# Patient Record
Sex: Male | Born: 1945
Health system: Southern US, Community
[De-identification: ages and names within clinical notes are randomized; demographics above are authoritative.]

## PROBLEM LIST (undated history)

## (undated) DIAGNOSIS — M1380 Other specified arthritis, unspecified site: Secondary | ICD-10-CM

## (undated) DIAGNOSIS — F418 Other specified anxiety disorders: Secondary | ICD-10-CM

## (undated) DIAGNOSIS — G4733 Obstructive sleep apnea (adult) (pediatric): Secondary | ICD-10-CM

## (undated) DIAGNOSIS — Z8619 Personal history of other infectious and parasitic diseases: Secondary | ICD-10-CM

## (undated) DIAGNOSIS — K219 Gastro-esophageal reflux disease without esophagitis: Secondary | ICD-10-CM

## (undated) DIAGNOSIS — N400 Enlarged prostate without lower urinary tract symptoms: Secondary | ICD-10-CM

## (undated) DIAGNOSIS — G8929 Other chronic pain: Secondary | ICD-10-CM

## (undated) DIAGNOSIS — N402 Nodular prostate without lower urinary tract symptoms: Secondary | ICD-10-CM

## (undated) DIAGNOSIS — M75100 Unspecified rotator cuff tear or rupture of unspecified shoulder, not specified as traumatic: Secondary | ICD-10-CM

## (undated) DIAGNOSIS — G5600 Carpal tunnel syndrome, unspecified upper limb: Secondary | ICD-10-CM

## (undated) DIAGNOSIS — M5090 Cervical disc disorder, unspecified, unspecified cervical region: Secondary | ICD-10-CM

## (undated) DIAGNOSIS — R319 Hematuria, unspecified: Secondary | ICD-10-CM

## (undated) DIAGNOSIS — M545 Low back pain, unspecified: Secondary | ICD-10-CM

## (undated) DIAGNOSIS — G4700 Insomnia, unspecified: Secondary | ICD-10-CM

## (undated) DIAGNOSIS — Z9989 Dependence on other enabling machines and devices: Secondary | ICD-10-CM

## (undated) DIAGNOSIS — Z86010 Personal history of colonic polyps: Secondary | ICD-10-CM

## (undated) HISTORY — DX: Obstructive sleep apnea (adult) (pediatric): G47.33

## (undated) HISTORY — DX: Carpal tunnel syndrome, unspecified upper limb: G56.00

## (undated) HISTORY — DX: Nodular prostate without lower urinary tract symptoms: N40.2

## (undated) HISTORY — DX: Low back pain: M54.5

## (undated) HISTORY — DX: Low back pain, unspecified: M54.50

## (undated) HISTORY — DX: Cervical disc disorder, unspecified, unspecified cervical region: M50.90

## (undated) HISTORY — DX: Other specified anxiety disorders: F41.8

## (undated) HISTORY — DX: Hematuria, unspecified: R31.9

## (undated) HISTORY — DX: Unspecified rotator cuff tear or rupture of unspecified shoulder, not specified as traumatic: M75.100

## (undated) HISTORY — DX: Insomnia, unspecified: G47.00

## (undated) HISTORY — DX: Other chronic pain: G89.29

## (undated) HISTORY — DX: Personal history of other infectious and parasitic diseases: Z86.19

## (undated) HISTORY — DX: Obstructive sleep apnea (adult) (pediatric): Z99.89

## (undated) HISTORY — DX: Personal history of colonic polyps: Z86.010

## (undated) HISTORY — DX: Benign prostatic hyperplasia without lower urinary tract symptoms: N40.0

## (undated) HISTORY — DX: Gastro-esophageal reflux disease without esophagitis: K21.9

## (undated) HISTORY — DX: Other specified arthritis, unspecified site: M13.80

---

## 2000-02-08 HISTORY — PX: ACHILLES TENDON REPAIR: SUR1153

## 2003-02-08 DIAGNOSIS — Z860100 Personal history of colon polyps, unspecified: Secondary | ICD-10-CM

## 2003-02-08 HISTORY — DX: Personal history of colonic polyps: Z86.010

## 2003-02-08 HISTORY — DX: Personal history of colon polyps, unspecified: Z86.0100

## 2005-02-07 HISTORY — PX: CATARACT EXTRACTION: SUR2

## 2005-02-07 HISTORY — PX: UPPER GASTROINTESTINAL ENDOSCOPY: SHX188

## 2006-02-07 HISTORY — PX: ROTATOR CUFF REPAIR: SHX139

## 2006-08-08 HISTORY — PX: DOBUTAMINE STRESS ECHO: SHX5426

## 2008-02-22 LAB — LIPID PANEL
Cholesterol: 149 mg/dL (ref 0–200)
HDL: 50 mg/dL (ref 35–70)
Triglycerides: 75

## 2008-11-07 HISTORY — PX: COLONOSCOPY: SHX174

## 2009-01-20 LAB — CBC
Hemoglobin: 14 g/dL (ref 13.5–17.5)
WBC: 6.5

## 2009-11-25 LAB — COMPREHENSIVE METABOLIC PANEL
AST: 26 U/L
Albumin: 3.9
Glucose: 97
Potassium: 4.1 mmol/L

## 2010-06-08 HISTORY — PX: LUMBAR DISC SURGERY: SHX700

## 2010-09-08 HISTORY — PX: OTHER SURGICAL HISTORY: SHX169

## 2010-11-30 ENCOUNTER — Ambulatory Visit: Payer: Self-pay | Admitting: Family Medicine

## 2010-12-02 ENCOUNTER — Ambulatory Visit (INDEPENDENT_AMBULATORY_CARE_PROVIDER_SITE_OTHER): Payer: BC Managed Care – PPO | Admitting: Family Medicine

## 2010-12-02 ENCOUNTER — Encounter: Payer: Self-pay | Admitting: Family Medicine

## 2010-12-02 DIAGNOSIS — K635 Polyp of colon: Secondary | ICD-10-CM | POA: Insufficient documentation

## 2010-12-02 DIAGNOSIS — K219 Gastro-esophageal reflux disease without esophagitis: Secondary | ICD-10-CM

## 2010-12-02 DIAGNOSIS — F341 Dysthymic disorder: Secondary | ICD-10-CM

## 2010-12-02 DIAGNOSIS — M129 Arthropathy, unspecified: Secondary | ICD-10-CM

## 2010-12-02 DIAGNOSIS — R319 Hematuria, unspecified: Secondary | ICD-10-CM | POA: Insufficient documentation

## 2010-12-02 DIAGNOSIS — M75100 Unspecified rotator cuff tear or rupture of unspecified shoulder, not specified as traumatic: Secondary | ICD-10-CM | POA: Insufficient documentation

## 2010-12-02 DIAGNOSIS — F331 Major depressive disorder, recurrent, moderate: Secondary | ICD-10-CM | POA: Insufficient documentation

## 2010-12-02 DIAGNOSIS — G4710 Hypersomnia, unspecified: Secondary | ICD-10-CM

## 2010-12-02 DIAGNOSIS — M1990 Unspecified osteoarthritis, unspecified site: Secondary | ICD-10-CM

## 2010-12-02 DIAGNOSIS — S43429A Sprain of unspecified rotator cuff capsule, initial encounter: Secondary | ICD-10-CM

## 2010-12-02 DIAGNOSIS — F418 Other specified anxiety disorders: Secondary | ICD-10-CM

## 2010-12-02 DIAGNOSIS — G4733 Obstructive sleep apnea (adult) (pediatric): Secondary | ICD-10-CM

## 2010-12-02 DIAGNOSIS — D126 Benign neoplasm of colon, unspecified: Secondary | ICD-10-CM

## 2010-12-02 MED ORDER — OMEPRAZOLE 20 MG PO CPDR: 20.0000 mg | DELAYED_RELEASE_CAPSULE | Freq: Every day | ORAL | Status: DC

## 2010-12-02 MED ORDER — NYSTATIN 100000 UNIT/ML MT SUSP: 500000.0000 [IU] | Freq: Four times a day (QID) | OROMUCOSAL | Status: DC | PRN

## 2010-12-02 MED ORDER — DEXTROAMPHETAMINE SULFATE 10 MG PO TABS: 10.0000 mg | ORAL_TABLET | ORAL | Status: DC | PRN

## 2010-12-02 MED ORDER — BUPROPION HCL ER (XL) 300 MG PO TB24: 300.0000 mg | ORAL_TABLET | Freq: Every day | ORAL | Status: DC

## 2010-12-02 MED ORDER — MELOXICAM 15 MG PO TABS: 15.0000 mg | ORAL_TABLET | ORAL | Status: DC

## 2010-12-02 MED ORDER — TERAZOSIN HCL 1 MG PO CAPS: 1.0000 mg | ORAL_CAPSULE | Freq: Every day | ORAL | Status: DC

## 2010-12-02 MED ORDER — ZOLPIDEM TARTRATE 10 MG PO TABS: 10.0000 mg | ORAL_TABLET | Freq: Every evening | ORAL | Status: DC | PRN

## 2010-12-02 MED ORDER — METHOCARBAMOL 750 MG PO TABS: 750.0000 mg | ORAL_TABLET | Freq: Three times a day (TID) | ORAL | Status: DC

## 2010-12-02 NOTE — Assessment & Plan Note (Signed)
Chronic, stable. Continue omeprazole  

## 2010-12-02 NOTE — Assessment & Plan Note (Signed)
?   of this diagnosis.   Refilled stimulant x 1 month, but advised pt will need to set up with neuro or pulm for further management.  Awaiting records.

## 2010-12-02 NOTE — Assessment & Plan Note (Signed)
On wellbutrin, has seen psych in past.

## 2010-12-02 NOTE — Progress Notes (Signed)
Subjective:    Patient ID: Gabriel [name], male    DOB: 08-11-1945, 65 y.o.   MRN: [id number]  HPI CC: new pt, establish  First visit here.  This year had L3 diskectomy in [place].  Back pain and right shoulder recently flared.  MRI of lower back recently 09/2010, no records available.  States has seen pain doctor, has had ESI in past, didn't help.  Only currently on roxicet qhs prn.  Previously on gabapentin and skelaxin.  Has had bilateral supraspinatus tendon tear, s/p repair on left side.  Last MRI of shoulder was ~2008.  Feeling extremely tired.  H/o OSA - using nasal mask.  Reports compliance and tolerating mask.  clergyman and substitute Runner, broadcasting/film/video.  Falls asleep in classroom occasionally.  Started on stimulant by neurology for what sounds like hypersomnolence. Trouble sleeping at night.  When awakens, unable to fall back asleep.  Bedtime is 11:30pm.  wakes up between 4 and 6am.  20lb weight loss in last year, some trying, some thinks due to depression.  Decreased appetite.  No fevers/chills, night sweats.  Unexplained blood in urine for last 2 years, had urology w/u, unrevealing per pt.  Sees urology every 6 mo for f/u.  Wife had stroke 25 yrs ago, currently in power chair, has to help her transfer, lots of lifting, significant impact on own MSK issues.  Preventative:  Colonoscopy 2009, will await records Prostate - followed by urology.  Possible prostate nodule, PSA always ok per pt tetanus 2010 Pneumonia 2008 Flu 2012.  Medications and allergies reviewed and updated in chart.  Past histories reviewed and updated if relevant as below. There is no problem list on file for this patient.  Past Medical History  Diagnosis Date  . Arthritis   . Hematuria - cause not known     s/p urology workup 2010  . History of chicken pox   . Depression   . GERD (gastroesophageal reflux disease)   . Colon polyp   . OSA (obstructive sleep apnea)     nasal cpap machine, ambien  . Torn  rotator cuff     bilateral, s/p L repair   Past Surgical History  Procedure Date  . Achilles tendon repair 2002  . Cataract extraction 2007    left  . Rotator cuff repair 2008    Left  . Lumbar microdiscectomy 2012    L3  . Colonoscopy 2009    repeat due    History  Substance Use Topics  . Smoking status: Never Smoker   . Smokeless tobacco: Never Used  . Alcohol Use: No   Family History  Problem Relation Age of Onset  . Arthritis Mother   . COPD Father     smoker  . Cancer Neg Hx   . Stroke Neg Hx   . Coronary artery disease Neg Hx   . Diabetes Neg Hx    Allergies  Allergen Reactions  . Amoxicillin Nausea And Vomiting  . Erythromycin Nausea And Vomiting  . Sulfa Drugs Cross Reactors Other (See Comments)    Thrush   No current outpatient prescriptions on file prior to visit.   Review of Systems  Constitutional: Negative for fever, chills, activity change, appetite change, fatigue and unexpected weight change.  HENT: Negative for hearing loss and neck pain.   Eyes: Negative for visual disturbance.  Respiratory: Positive for cough (mild). Negative for chest tightness, shortness of breath and wheezing.   Cardiovascular: Negative for chest pain, palpitations and leg swelling.  Gastrointestinal: Negative for nausea, vomiting, abdominal pain, diarrhea, constipation, blood in stool and abdominal distention.  Genitourinary: Positive for hematuria. Negative for difficulty urinating.  Musculoskeletal: Negative for myalgias and arthralgias.  Skin: Negative for rash.  Neurological: Negative for dizziness, seizures, syncope and headaches.  Hematological: Does not bruise/bleed easily.  Psychiatric/Behavioral: Positive for dysphoric mood. The patient is nervous/anxious.       Objective:   Physical Exam  Nursing note and vitals reviewed. Constitutional: He is oriented to person, place, and time. He appears well-developed and well-nourished. No distress.  HENT:  Head:  Normocephalic and atraumatic.  Right Ear: External ear normal.  Left Ear: External ear normal.  Nose: Nose normal.  Mouth/Throat: Oropharynx is clear and moist.  Eyes: Conjunctivae and EOM are normal. Pupils are equal, round, and reactive to light.  Neck: Normal range of motion. Neck supple. No thyromegaly present.  Cardiovascular: Normal rate, regular rhythm, normal heart sounds and intact distal pulses.   No murmur heard. Pulses:      Radial pulses are 2+ on the right side, and 2+ on the left side.  Pulmonary/Chest: Effort normal and breath sounds normal. No respiratory distress. He has no wheezes. He has no rales.  Abdominal: Soft. Bowel sounds are normal. He exhibits no distension and no mass. There is no tenderness. There is no rebound and no guarding.  Musculoskeletal: Normal range of motion.       No shoulder pain or deformity + tender to empty can test on left, normal on right  Lymphadenopathy:    He has no cervical adenopathy.  Neurological: He is alert and oriented to person, place, and time.       CN grossly intact, station and gait intact  Skin: Skin is warm and dry. No rash noted.  Psychiatric: He has a normal mood and affect. His behavior is normal. Judgment and thought content normal.       Mildly restless, talkative      Assessment & Plan:  Prior physicians: PCP Dr. Nicholes Calamity, [phone] Neuro Dr. Laurena Spies, [phone] Rheum Dr. Alyse Low, [phone]

## 2010-12-02 NOTE — Assessment & Plan Note (Signed)
Last colonoscopy 2009. Await records to see when next is due.

## 2010-12-02 NOTE — Assessment & Plan Note (Signed)
R recently flared.  Anticipate tendonitis.  Treat with mobic scheduled for 5 days then PRN pain. rec ice/heat. Return if not improved for further eval.

## 2010-12-02 NOTE — Patient Instructions (Signed)
Good to meet you today. Take mobic once daily for 5 days then as needed for shoulder pain.  May ice/heat shoulder. Try to avoid heavy lifting >20lbs, or straining shoulder. I have refilled all meds.  I have refilled dextroamphetamine x 1 month but will want to set you up with lung or neurology for further refills. I will await records, work on referrals after I review records.

## 2010-12-02 NOTE — Assessment & Plan Note (Signed)
Awaiting records from prior PCP.

## 2010-12-02 NOTE — Assessment & Plan Note (Signed)
S/p diskectomy. Await records.   Hopeful for mobic to help with lower back inflammation.   Update [place] as needed.

## 2010-12-17 ENCOUNTER — Encounter: Payer: Self-pay | Admitting: Family Medicine

## 2011-01-06 ENCOUNTER — Ambulatory Visit (INDEPENDENT_AMBULATORY_CARE_PROVIDER_SITE_OTHER): Payer: BC Managed Care – PPO | Admitting: Family Medicine

## 2011-01-06 ENCOUNTER — Encounter: Payer: Self-pay | Admitting: Family Medicine

## 2011-01-06 DIAGNOSIS — M25519 Pain in unspecified shoulder: Secondary | ICD-10-CM

## 2011-01-06 DIAGNOSIS — G8929 Other chronic pain: Secondary | ICD-10-CM

## 2011-01-06 DIAGNOSIS — M545 Low back pain, unspecified: Secondary | ICD-10-CM

## 2011-01-06 DIAGNOSIS — M25511 Pain in right shoulder: Secondary | ICD-10-CM

## 2011-01-06 MED ORDER — AMITRIPTYLINE HCL 25 MG PO TABS: 25.0000 mg | ORAL_TABLET | Freq: Every day | ORAL | Status: DC

## 2011-01-06 NOTE — Assessment & Plan Note (Signed)
States has tried lyrica, gabapentin, cymbalta. Trial of elavil for next 3-4 wks, update me if no change.

## 2011-01-06 NOTE — Patient Instructions (Signed)
For shoulder - I wonder if this is biceps tendonitis.  Treat with stretching exercises provided today.  mobic (meloxicam) for pain as well as continue percocets and robaxin. We will start you on amitriptyline 25mg  at bedtime - watch for dry mouth on this medicine. If symptoms not improving let me know for next step. Good to see you today.  Call us with questions.

## 2011-01-06 NOTE — Assessment & Plan Note (Addendum)
H/o rotator cuff tear, however exam not consistent with this today. ?biceps tendinitis - treat as such with stretch exercises from [facility] pt advisor and rec continue current pain meds.   Update me if sxs not improved, consider referral to PT or ortho. See above. Pain/exam not consistent with cervical etiology. A total of 25 minutes were spent face-to-face with the patient during this encounter and over half of that time was spent on counseling and coordination of care

## 2011-01-06 NOTE — Progress Notes (Signed)
Subjective:    Patient ID: Gabriel [name], male    DOB: 1945-06-16, 65 y.o.   MRN: [id number]  HPI CC: continued neck/arm pain  Left handed gentleman presents for f/u.  Chronic back pain issues, shoulder pain.  Lower lumbar pain worsened with prolonged sitting, improved with walking.    Thoracic pain R>L, accentuated with recent move (lifting boxes, wife in wheelchair),   H/o L rotator cuff tear s/p repair 02/2006.  Worsened pain on right shoulder/upper arm feels like shooting/stabbing pain as well as lateral shoulder.  No numbness/tingling.  Endorsing occasional weakness in right arm.  Going to gym 2x/wk, water exercises.  On meloxicam 15mg  daily, methocarbamol 750mg  bid, percocet 5/500 mg (takes 2-3/wk).  Pain in back stays (3/10), at its worst 8/10.  Arm pain worse with exhertion (10/10 pain when shooting), otherwise 3-4/10.  Has been on lyrica, cymbalta in past.  Previously on gabapentin and skelaxin as well.   This year had L3 diskectomy in [place]. Back pain and right shoulder recently flared. MRI of lower back recently 09/2010. States has seen pain doctor, has had ESI in past, didn't help. Has had bilateral supraspinatus tendon tear, s/p repair on left side. Last MRI of shoulder was ~2008.  Medications and allergies reviewed and updated in chart.  Past histories reviewed and updated if relevant as below. Patient Active Problem List  Diagnoses  . Arthritis  . Hematuria - cause not known  . Depression with anxiety  . GERD (gastroesophageal reflux disease)  . OSA (obstructive sleep apnea)  . Torn rotator cuff  . Colon polyp  . Hypersomnolence   Past Medical History  Diagnosis Date  . Rheumatoid arthritis   . Hematuria - cause not known     s/p urology workup 2010  . History of chicken pox   . Depression with anxiety   . GERD (gastroesophageal reflux disease)   . Colon polyp   . OSA (obstructive sleep apnea)     nasal cpap machine at 12cm, ambien  . Torn rotator  cuff     bilateral, s/p L repair  . Chronic lower back pain     lumbar stenosis with persistent numbness R ant thigh   Past Surgical History  Procedure Date  . Achilles tendon repair 2002  . Cataract extraction 2007    left  . Rotator cuff repair 2008    Left (Dr. Holley Dexter in [place], now [place])  . Lumbar disc surgery 2012    L3 laminectomy (Dr. Wyline Mood at [facility])  . Colonoscopy 2009    repeat due ??  . Mri thoracic 09/2010    thoracic DDD/kyphosis, minimal anterolisthesis T2/3 with B foraminal narrowing   History  Substance Use Topics  . Smoking status: Never Smoker   . Smokeless tobacco: Never Used  . Alcohol Use: No   Family History  Problem Relation Age of Onset  . Arthritis Mother   . COPD Father     smoker  . Cancer Neg Hx   . Stroke Neg Hx   . Coronary artery disease Neg Hx   . Diabetes Neg Hx    Allergies  Allergen Reactions  . Amoxicillin Nausea And Vomiting  . Erythromycin Nausea And Vomiting  . Sulfa Drugs Cross Reactors Other (See Comments)    Thrush   Current Outpatient Prescriptions on File Prior to Visit  Medication Sig Dispense Refill  . buPROPion (WELLBUTRIN XL) 300 MG 24 hr tablet Take 1 tablet (300 mg total) by mouth daily.  90  tablet  3  . dextroamphetamine (DEXTROSTAT) 10 MG tablet Take 1 tablet (10 mg total) by mouth as needed. Per neurology  30 tablet  0  . meloxicam (MOBIC) 15 MG tablet Take 1 tablet (15 mg total) by mouth as directed. Take 1 pill daily for 5 days and then daily as needed.  30 tablet  1  . methocarbamol (ROBAXIN) 750 MG tablet Take 1 tablet (750 mg total) by mouth 3 (three) times daily.  270 tablet  3  . omeprazole (PRILOSEC) 20 MG capsule Take 1 capsule (20 mg total) by mouth daily.  90 capsule  3  . oxycodone-acetaminophen (ROXICET) 5-500 MG per tablet Take 1 tablet by mouth at bedtime as needed.        . terazosin (HYTRIN) 1 MG capsule Take 1 capsule (1 mg total) by mouth at bedtime.  90 capsule  3  . zolpidem (AMBIEN) 10 MG  tablet Take 1 tablet (10 mg total) by mouth at bedtime as needed.  90 tablet  3  . nystatin (MYCOSTATIN) 100000 UNIT/ML suspension Take 5 mLs (500,000 Units total) by mouth 4 (four) times daily as needed.  180 mL  1     Review of Systems Per HPI    Objective:   Physical Exam  Nursing note and vitals reviewed. Constitutional: He appears well-developed and well-nourished. No distress.  Musculoskeletal:       Neck - FROM.  Neg spurling sign Thoracic back - no pain midline, paraspinous mm tenderness. Lumbar back - no midline spine tenderness. Shoulders - FROM without pain.  Tender to palpation right anterior shoulder joint at biceps tendon.  No pain with int/ext rotation of shoulder, no pain with neer's or empty can sign, + speed's test on right.  Neurological: He is alert.  Skin: Skin is warm and dry. No rash noted.  Psychiatric: He has a normal mood and affect.       Assessment & Plan:

## 2011-01-09 ENCOUNTER — Encounter: Payer: Self-pay | Admitting: Family Medicine

## 2011-01-19 ENCOUNTER — Encounter: Payer: Self-pay | Admitting: Family Medicine

## 2011-02-11 ENCOUNTER — Telehealth: Payer: Self-pay | Admitting: Family Medicine

## 2011-02-11 NOTE — Telephone Encounter (Signed)
Call to confirm refill requests and questions re: medications.  Transferred voice mail to [facility] phone.

## 2011-02-14 ENCOUNTER — Other Ambulatory Visit: Payer: Self-pay | Admitting: Family Medicine

## 2011-02-14 NOTE — Telephone Encounter (Signed)
OK to refill

## 2011-02-14 NOTE — Telephone Encounter (Signed)
Sent in

## 2011-02-19 ENCOUNTER — Ambulatory Visit (INDEPENDENT_AMBULATORY_CARE_PROVIDER_SITE_OTHER): Payer: BC Managed Care – PPO | Admitting: Internal Medicine

## 2011-02-19 ENCOUNTER — Encounter: Payer: Self-pay | Admitting: Internal Medicine

## 2011-02-19 VITALS — BP 124/82 | HR 64 | Temp 98.0°F | Wt 196.0 lb

## 2011-02-19 DIAGNOSIS — J069 Acute upper respiratory infection, unspecified: Secondary | ICD-10-CM

## 2011-02-19 DIAGNOSIS — J209 Acute bronchitis, unspecified: Secondary | ICD-10-CM

## 2011-02-19 MED ORDER — HYDROCODONE-HOMATROPINE 5-1.5 MG/5ML PO SYRP: 5.0000 mL | ORAL_SOLUTION | Freq: Four times a day (QID) | ORAL | Status: DC | PRN

## 2011-02-19 MED ORDER — HYDROCODONE-HOMATROPINE 5-1.5 MG/5ML PO SYRP: 5.0000 mL | ORAL_SOLUTION | Freq: Four times a day (QID) | ORAL | Status: AC | PRN

## 2011-02-19 MED ORDER — AZITHROMYCIN 250 MG PO TABS: ORAL_TABLET | ORAL | Status: AC

## 2011-02-19 MED ORDER — FLUTICASONE-SALMETEROL 250-50 MCG/DOSE IN AEPB: 1.0000 | INHALATION_SPRAY | Freq: Two times a day (BID) | RESPIRATORY_TRACT | Status: DC

## 2011-02-19 NOTE — Progress Notes (Signed)
Addended by: Maurice Small on: 02/19/2011 01:05 PM   Modules accepted: Orders

## 2011-02-19 NOTE — Patient Instructions (Signed)
Plain Mucinex for thick secretions ;force NON dairy fluids. Use a Neti pot daily as needed for sinus congestion Advair one inhalation every 12 hours; gargle and spit after use  

## 2011-02-19 NOTE — Progress Notes (Signed)
  Subjective:    Patient ID: Gabriel [name], male    DOB: 1945-08-14, 66 y.o.   MRN: [id number]  HPI Respiratory tract infection Onset/symptoms:1 week ago as raspy throat Exposures (illness/environmental/extrinsic):congregants ill Progression of symptoms:rhinitis & cough Treatments/response:Mucinex DM, Tussend helped some Present symptoms: Fever/chills/sweats:chills & sweats Frontal headache:no Facial pain:no Nasal purulence:some yellow Sore throat:no Dental pain:no Lymphadenopathy:no Wheezing/shortness of breath:some wheezing Cough/sputum/hemoptysis:thick green material Pleuritic pain:no Associated extrinsic/allergic symptoms:itchy eyes/ sneezing:minor sneezing Past medical history: Seasonal allergies : no/asthma:no Smoking history:never           Review of Systems     Objective:   Physical Exam General appearance:good health ;well nourished; no acute distress or increased work of breathing is present.  No  lymphadenopathy about the head, neck, or axilla noted.   Eyes: No conjunctival inflammation or lid edema is present.   Ears:  External ear exam shows no significant lesions or deformities.  Otoscopic examination reveals clear canals, tympanic membranes are intact bilaterally without bulging, retraction, inflammation or discharge.  Nose:  External nasal examination shows no deformity or inflammation. Nasal mucosa are erythematous , R > L, without lesions or exudates. No septal dislocation or deviation.No obstruction to airflow.   Oral exam: Dental hygiene is good; lips and gums are healthy appearing.There is no oropharyngeal erythema or exudate noted.      Heart:  Normal rate and regular rhythm. S1 and S2 normal without gallop, murmur, click, rub S 4 Lungs: He exhibits diffuse, homogenous, low-grade musical rhonchi and wheezes with forced expiration.No increased work of breathing.    Extremities:  No cyanosis, edema, or clubbing  noted . Mild traumatic changes are  present.   Skin: Warm & dry .                                                                                          Assessment & Plan:    #1 bronchitis, acute with asthmatic component.  #2 upper respiratory infection without definitive rhinosinusitis  Plan: See orders and recommendations

## 2011-02-24 ENCOUNTER — Encounter: Payer: Self-pay | Admitting: Family Medicine

## 2011-03-03 ENCOUNTER — Encounter: Payer: Self-pay | Admitting: Family Medicine

## 2011-03-03 ENCOUNTER — Ambulatory Visit (INDEPENDENT_AMBULATORY_CARE_PROVIDER_SITE_OTHER): Payer: BC Managed Care – PPO | Admitting: Family Medicine

## 2011-03-03 ENCOUNTER — Ambulatory Visit: Payer: BC Managed Care – PPO | Admitting: Family Medicine

## 2011-03-03 VITALS — BP 112/68 | HR 80 | Temp 98.6°F | Wt 201.2 lb

## 2011-03-03 DIAGNOSIS — G4700 Insomnia, unspecified: Secondary | ICD-10-CM

## 2011-03-03 DIAGNOSIS — M25511 Pain in right shoulder: Secondary | ICD-10-CM

## 2011-03-03 DIAGNOSIS — G8929 Other chronic pain: Secondary | ICD-10-CM

## 2011-03-03 DIAGNOSIS — M25519 Pain in unspecified shoulder: Secondary | ICD-10-CM

## 2011-03-03 DIAGNOSIS — M545 Low back pain, unspecified: Secondary | ICD-10-CM

## 2011-03-03 DIAGNOSIS — Z0000 Encounter for general adult medical examination without abnormal findings: Secondary | ICD-10-CM

## 2011-03-03 MED ORDER — AMITRIPTYLINE HCL 25 MG PO TABS: 25.0000 mg | ORAL_TABLET | Freq: Every day | ORAL | Status: DC

## 2011-03-03 MED ORDER — MELOXICAM 15 MG PO TABS: 15.0000 mg | ORAL_TABLET | Freq: Every day | ORAL | Status: DC | PRN

## 2011-03-03 NOTE — Patient Instructions (Addendum)
Try to cut back on ambien - only use if needed or if on a plane trip, etc. Continue amitriptyline 25mg  daily.  I'm glad it's helping. For meloxicam - try to back off this medicine (ok to stop it) and only take as needed. Return in 3-6 months for physical, fasting prior for blood work.

## 2011-03-03 NOTE — Progress Notes (Signed)
Subjective:    Patient ID: Gabriel [name], male    DOB: 1945-05-06, 66 y.o.   MRN: [id number]  HPI CC: 3 mo f/u  Pleasant L handed 65yo gentleman with seronegative rheumatoid arthritis, depression/anxiety, GERD, OSA on nasal CAP, h/o chronic back pain from DDD (s/p L3/4 discectomy 2012, h/o cervical HNP, thoracic DDD minimal anterolisthesis T2/3), shoulder pain issues (bilateral RTC supraspinatus tears s/p L sided repair 2008).  [facility] with advair, azithromycin, and cough syrup.  Mild residual cough improving.  Right shoulder pain - last visit thought bicipital tendonitis, treated with meloxicam and stretching exercises from [facility] pt advisor, doing intermittently.  Some improvement noted, but does have continued intermittent shoulder pain.    Lower back pain - started on amitriptyline 25mg  daily, thinks has helped with lower back pain.  Asks if needs to continue this.  Insomnia - wonders if can stop [place] gven recent news reports about side effects.  Was started on amitriptyline 25mg  nightly last visit, Gabriel be helping sleep.  On meloxicam 15mg  daily, methocarbamol 750mg  bid, percocet 5/500 mg (takes 2-3/wk). Pain in back stays (3/10), at its worst 8/10. Arm pain worse with exhertion (10/10 pain when shooting), otherwise 3-4/10. Has been tried on lyrica, cymbalta, gabapentin and skelaxin.  Has also had ESI.  Past Medical History  Diagnosis Date  . Seronegative arthritis     RA, previously on MTX, RF <7, ANA nl (rheum Dr. Betsy Coder)  . Hematuria - cause not known     s/p urology workup 2010, nl CT, cystoscopy, cytology  . History of chicken pox   . Depression with anxiety     h/o remote hospitalization for depression  . GERD (gastroesophageal reflux disease)   . History of colon polyps 2005    tubular adenoma, on rpt no more  . OSA (obstructive sleep apnea)     nasal cpap machine at 12cm, ambien, daytime drowsiness attributed to OSA, last  changed dextroamphetamine to nuvigil, previously tried provigil  . Torn rotator cuff     bilateral, s/p L repair  . Chronic lower back pain     DDD, prior on lyrica, gabapentin, lumbar stenosis with persistent numbness R ant thigh  . BPH (benign prostatic hypertrophy)     h/o prostate nodule, followed by Dr. Isabel Caprice  . Insomnia   . Cervical neck pain with evidence of disc disease     h/o bulging discs   Past Surgical History  Procedure Date  . Achilles tendon repair 2002  . Cataract extraction 2007    left  . Rotator cuff repair 2008    Left (Dr. Holley Dexter in [place], now [place])  . Lumbar disc surgery 07/2010    L3/4 diskectomy ([facility] Wyline Mood [facility])  . Colonoscopy 11/2008    diverticulosis, rpt 5 yrs  . Mri thoracic 09/2010    thoracic DDD/kyphosis, minimal anterolisthesis T2/3 with B foraminal narrowing  . Upper gastrointestinal endoscopy 02/2005    minimal GERD, small gastric polyp, tiny HH  . Dobutamine stress echo 08/2006    negative for ischemia    Review of Systems Per HPI    Objective:   Physical Exam  Vitals reviewed. Constitutional: He appears well-developed and well-nourished. No distress.  HENT:  Head: Normocephalic and atraumatic.  Mouth/Throat: Oropharynx is clear and moist. No oropharyngeal exudate.  Eyes: Conjunctivae and EOM are normal. Pupils are equal, round, and reactive to light. No scleral icterus.  Neck: Normal range of motion. Neck supple. Carotid  bruit is not present.  Cardiovascular: Normal rate, regular rhythm, normal heart sounds and intact distal pulses.   No murmur heard. Pulmonary/Chest: Breath sounds normal. No respiratory distress. He has no wheezes. He has no rales.  Musculoskeletal: He exhibits no edema.  Lymphadenopathy:    He has no cervical adenopathy.  Skin: Skin is warm and dry. No rash noted.       Assessment & Plan:

## 2011-03-04 DIAGNOSIS — G4700 Insomnia, unspecified: Secondary | ICD-10-CM | POA: Insufficient documentation

## 2011-03-04 NOTE — Assessment & Plan Note (Signed)
Likely contributed some by OSA use. On ambien for 20+ yrs.   Carries dx of hypersomnolence as well. Will start with titrating off ambien (pt desires this as well), then reassess hypersomnolence.

## 2011-03-04 NOTE — Assessment & Plan Note (Signed)
H/o bilateral RTC tears s/p L repair.   Last visit R shoulder pain thought consistent with biceps tendonitis. mobic has helped but taking daily. rec decrease to 7.5mg  daily and only use prn to see if still good control. Update me if not improving.

## 2011-03-04 NOTE — Assessment & Plan Note (Signed)
Some response to amitriptyline.  Continue Has tried lyrica, gabapentin and cymbalta in past.

## 2011-05-26 ENCOUNTER — Other Ambulatory Visit (INDEPENDENT_AMBULATORY_CARE_PROVIDER_SITE_OTHER): Payer: BC Managed Care – PPO

## 2011-05-26 DIAGNOSIS — Z0000 Encounter for general adult medical examination without abnormal findings: Secondary | ICD-10-CM

## 2011-05-26 LAB — COMPREHENSIVE METABOLIC PANEL
Albumin: 4 g/dL (ref 3.5–5.2)
Alkaline Phosphatase: 82 U/L (ref 39–117)
BUN: 19 mg/dL (ref 6–23)
CO2: 29 mEq/L (ref 19–32)
Calcium: 8.8 mg/dL (ref 8.4–10.5)
Chloride: 103 mEq/L (ref 96–112)
GFR: 66.81 mL/min (ref >60.00)
Glucose, Bld: 79 mg/dL (ref 70–99)
Potassium: 4.3 mEq/L (ref 3.5–5.1)
Sodium: 139 mEq/L (ref 135–145)
Total Protein: 6.8 g/dL (ref 6.0–8.3)

## 2011-05-26 LAB — LIPID PANEL
Cholesterol: 146 mg/dL (ref 0–200)
VLDL: 9.4 mg/dL (ref 0.0–40.0)

## 2011-05-26 LAB — TSH: TSH: 0.57 u[IU]/mL (ref 0.35–5.50)

## 2011-06-02 ENCOUNTER — Ambulatory Visit (INDEPENDENT_AMBULATORY_CARE_PROVIDER_SITE_OTHER): Payer: BC Managed Care – PPO | Admitting: Family Medicine

## 2011-06-02 ENCOUNTER — Encounter: Payer: Self-pay | Admitting: Family Medicine

## 2011-06-02 VITALS — BP 140/80 | HR 68 | Temp 98.3°F | Ht 68.0 in | Wt 216.8 lb

## 2011-06-02 DIAGNOSIS — Z0000 Encounter for general adult medical examination without abnormal findings: Secondary | ICD-10-CM

## 2011-06-02 MED ORDER — MELOXICAM 15 MG PO TABS: 15.0000 mg | ORAL_TABLET | Freq: Every day | ORAL | Status: DC | PRN

## 2011-06-02 MED ORDER — ZOLPIDEM TARTRATE 10 MG PO TABS: 5.0000 mg | ORAL_TABLET | Freq: Every evening | ORAL | Status: DC | PRN

## 2011-06-02 NOTE — Patient Instructions (Signed)
Good to see you today, call us with questions. Return as needed or in 4 months for follow up.

## 2011-06-02 NOTE — Progress Notes (Signed)
Subjective:    Patient ID: Gabriel [name], male    DOB: 1945/07/15, 66 y.o.   MRN: [id number]  HPI CC: CPE   Lots of stress from taking care of wife.  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  Ongoing aches and pains from back condition and shoulders.  Doing stretching exercises previously provided as able.  Preventative:  Last CPE last summer Colonoscopy 2010, rpt 5 yrs. Prostate - followed yearly by urology. Has established with Dr. Isabel Caprice.  Possible prostate nodule, but PSA always ok per pt.  Tetanus 04/2009 zostavax 2009 Pneumonia shot 2008  Flu 2012. Has scheduled ophtho appt to check for glaucoma as family history  Body mass index is 32.96 kg/(m^2).  Caffeine: avoids Lives with wife - who is in a power chair. Occupation: Musician, Lawyer Edu: masters + Likes to travel Activity: joined Y, goes several times a week Diet: some water, some fruits/vegetables  Wt Readings from Last 3 Encounters:  06/02/11 216 lb 12 oz (98.317 kg)  03/03/11 201 lb 4 oz (91.286 kg)  02/19/11 196 lb (88.905 kg)  noted weight going up significantly.  No CHF sxs, no h/o CHF.  Medications and allergies reviewed and updated in chart.  Past histories reviewed and updated if relevant as below. Patient Active Problem List  Diagnoses  . Arthritis  . Hematuria - cause not known  . Depression with anxiety  . GERD (gastroesophageal reflux disease)  . OSA (obstructive sleep apnea)  . Torn rotator cuff  . Colon polyp  . Hypersomnolence  . Right shoulder pain  . Chronic lower back pain  . Insomnia   Past Medical History  Diagnosis Date  . Seronegative arthritis     RA, previously on MTX, RF <7, ANA nl (rheum Dr. Betsy Coder)  . Hematuria - cause not known     s/p urology workup 2010, nl CT, cystoscopy, cytology  . History of chicken pox   . Depression with anxiety     h/o remote hospitalization for depression    . GERD (gastroesophageal reflux disease)   . History of colon polyps 2005    tubular adenoma, on rpt no more  . OSA (obstructive sleep apnea)     nasal cpap machine at 12cm, ambien, daytime drowsiness attributed to OSA, last changed dextroamphetamine to nuvigil, previously tried provigil  . Torn rotator cuff     bilateral, s/p L repair  . Chronic lower back pain     DDD, prior on lyrica, gabapentin, lumbar stenosis with persistent numbness R ant thigh  . BPH (benign prostatic hypertrophy)     h/o prostate nodule, followed by Dr. Isabel Caprice  . Insomnia   . Cervical neck pain with evidence of disc disease     h/o bulging discs   Past Surgical History  Procedure Date  . Achilles tendon repair 2002  . Cataract extraction 2007    left  . Rotator cuff repair 2008    Left (Dr. Holley Dexter in [place], now [place])  . Lumbar disc surgery 07/2010    L3/4 diskectomy (Dr. Wyline Mood at [facility])  . Colonoscopy 11/2008    diverticulosis, rpt 5 yrs  . Mri thoracic 09/2010    thoracic DDD/kyphosis, minimal anterolisthesis T2/3 with B foraminal narrowing  . Upper gastrointestinal endoscopy 02/2005    minimal GERD, small gastric polyp, tiny HH  . Dobutamine stress echo 08/2006    negative for ischemia   History  Substance Use Topics  . Smoking status: Never Smoker   . Smokeless tobacco: Never Used  . Alcohol Use: No   Family History  Problem Relation Age of Onset  . Arthritis Mother   . COPD Father     smoker  . Cancer Neg Hx   . Stroke Neg Hx   . Coronary artery disease Neg Hx   . Diabetes Neg Hx    Allergies  Allergen Reactions  . Amoxicillin Nausea And Vomiting  . Erythromycin Nausea And Vomiting  . Sulfa Drugs Cross Reactors Other (See Comments)    Thrush   Current Outpatient Prescriptions on File Prior to Visit  Medication Sig Dispense Refill  . amitriptyline (ELAVIL) 25 MG tablet Take 1 tablet (25 mg total) by mouth at bedtime.  90 tablet  3  . buPROPion (WELLBUTRIN XL) 300 MG 24 hr  tablet Take 1 tablet (300 mg total) by mouth daily.  90 tablet  3  . methocarbamol (ROBAXIN) 750 MG tablet Take 750 mg by mouth 3 (three) times daily as needed.      Marland Kitchen omeprazole (PRILOSEC) 20 MG capsule Take 1 capsule (20 mg total) by mouth daily.  90 capsule  3  . oxycodone-acetaminophen (ROXICET) 5-500 MG per tablet Take 1 tablet by mouth at bedtime as needed.        . terazosin (HYTRIN) 1 MG capsule Take 1 capsule (1 mg total) by mouth at bedtime.  90 capsule  3  . DISCONTD: zolpidem (AMBIEN) 10 MG tablet Take 1 tablet (10 mg total) by mouth at bedtime as needed.  90 tablet  3  . dextroamphetamine (DEXTROSTAT) 10 MG tablet Take 1 tablet (10 mg total) by mouth as needed. Per neurology  30 tablet  0  . nystatin (MYCOSTATIN) 100000 UNIT/ML suspension Take 5 mLs (500,000 Units total) by mouth 4 (four) times daily as needed.  180 mL  1     Review of Systems  Constitutional: Negative for fever, chills, activity change, appetite change, fatigue and unexpected weight change.  HENT: Negative for hearing loss and neck pain.   Eyes: Negative for visual disturbance.  Respiratory: Negative for cough, chest tightness, shortness of breath and wheezing.   Cardiovascular: Negative for chest pain, palpitations and leg swelling.  Gastrointestinal: Negative for nausea, vomiting, abdominal pain, diarrhea, constipation, blood in stool and abdominal distention.  Genitourinary: Negative for hematuria and difficulty urinating.  Musculoskeletal: Negative for myalgias and arthralgias.  Skin: Negative for rash.  Neurological: Negative for dizziness, seizures, syncope and headaches.  Hematological: Does not bruise/bleed easily.  Psychiatric/Behavioral: Negative for dysphoric mood. The patient is not nervous/anxious.        Objective:   Physical Exam  Nursing note and vitals reviewed. Constitutional: He is oriented to person, place, and time. He appears well-developed and well-nourished. No distress.  HENT:    Head: Normocephalic and atraumatic.  Right Ear: Hearing, tympanic membrane, external ear and ear canal normal.  Left Ear: Hearing, tympanic membrane, external ear and ear canal normal.  Nose: Nose normal. No mucosal edema or rhinorrhea.  Mouth/Throat: Uvula is midline, oropharynx is clear and moist and mucous membranes are normal. No oropharyngeal exudate, posterior oropharyngeal edema, posterior oropharyngeal erythema or tonsillar abscesses.  Eyes: Conjunctivae and EOM are normal. Pupils are equal, round, and reactive to light. No scleral icterus.  Neck: Normal range of motion. Neck supple. Carotid bruit is not present. No thyromegaly present.  Cardiovascular: Normal rate, regular rhythm, normal heart sounds and intact distal pulses.  No murmur heard. Pulses:      Radial pulses are 2+ on the right side, and 2+ on the left side.  Pulmonary/Chest: Effort normal and breath sounds normal. No respiratory distress. He has no wheezes. He has no rales.  Abdominal: Soft. Bowel sounds are normal. He exhibits no distension and no mass. There is no tenderness. There is no rebound and no guarding.  Musculoskeletal: Normal range of motion. He exhibits no edema.  Lymphadenopathy:    He has no cervical adenopathy.  Neurological: He is alert and oriented to person, place, and time.       CN grossly intact, station and gait intact  Skin: Skin is warm and dry. No rash noted.  Psychiatric: He has a normal mood and affect. His behavior is normal. Judgment and thought content normal.       Assessment & Plan:

## 2011-06-03 ENCOUNTER — Other Ambulatory Visit: Payer: Self-pay | Admitting: Family Medicine

## 2011-06-04 DIAGNOSIS — Z0000 Encounter for general adult medical examination without abnormal findings: Secondary | ICD-10-CM | POA: Insufficient documentation

## 2011-06-04 NOTE — Assessment & Plan Note (Signed)
Preventative protocols reviewed and updated unless pt declined. Reviewed blood work in detail - overall looking good. Discussed diet/lifesyle changes to effect weight loss. UTD colonoscopy (next due 2015) DRE/PSA done by urology.

## 2011-08-13 ENCOUNTER — Ambulatory Visit (INDEPENDENT_AMBULATORY_CARE_PROVIDER_SITE_OTHER): Payer: BC Managed Care – PPO | Admitting: Family Medicine

## 2011-08-13 ENCOUNTER — Encounter: Payer: Self-pay | Admitting: Family Medicine

## 2011-08-13 VITALS — BP 148/98 | Temp 97.8°F | Wt 217.0 lb

## 2011-08-13 DIAGNOSIS — L608 Other nail disorders: Secondary | ICD-10-CM | POA: Insufficient documentation

## 2011-08-13 DIAGNOSIS — L03019 Cellulitis of unspecified finger: Secondary | ICD-10-CM

## 2011-08-13 MED ORDER — CEPHALEXIN 500 MG PO CAPS
ORAL_CAPSULE | ORAL | Status: DC
Start: 2011-08-13 — End: 2011-08-29

## 2011-08-13 NOTE — Progress Notes (Signed)
  Subjective:    Patient ID: Gabriel [name], male    DOB: [birth year], 66 y.o.   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

## 2011-08-13 NOTE — Patient Instructions (Addendum)
Soak 4 x day Keflex,,,,,,,,2 po bid callpcp if any problems

## 2011-08-15 ENCOUNTER — Telehealth: Payer: Self-pay | Admitting: Family Medicine

## 2011-08-15 NOTE — Telephone Encounter (Signed)
Noted.  Paronychia s/p I&D

## 2011-08-15 NOTE — Telephone Encounter (Signed)
Triage Record Num: [id number] Operator: Kelle Darting Patient Name: Gabriel [name] Call Date & Time: 08/12/2011 9:56:55PM Patient Phone: [phone] PCP: Eustaquio Boyden Patient Gender: Male PCP Fax : [phone] Patient DOB: 01/03/1946 Practice Name: [facility] Reason for Call: Caller: Shadrack/Patient; PCP: Eustaquio Boyden; CB#: [phone]; Call regarding Painful, swollen and red middle finger; Afebrile; Onset: 08/10/11; Sx notes: Pt. is having redness, swelling and pain on the Rt. middle digit, denies any injury, pt. does chew nails; States he took a Hydrocodone with not a lot of help, has a throbbing pain; Guideline used: Hand Non-Injury; Disp: See provider within 4 hr. d/t any s/s of localized infection that has not improved or is worsening with 24hr of home care; Pt. request to be seen tomorrow; Appt. made: Yes, with Dr. Tawanna Cooler on 08/13/11 at 1030; Pt. voiced understanding of all. Protocol(s) Used: Hand Non-Injury Recommended Outcome per Protocol: See Provider within 4 hours Reason for Outcome: Any signs and symptoms of localized infection that has not improved or is worsening with 24 hours of home care or area of involvement is extending Care Advice: ~ Call provider if symptoms worsen or new symptoms develop. Position affected part so it is elevated at least 12 inches (30 cm) above level of heart to improve circulation and decrease discomfort. ~ Analgesic/Antipyretic Advice - NSAIDs: Consider aspirin, ibuprofen, naproxen or ketoprofen for pain or fever as directed on label or by pharmacist/provider. PRECAUTIONS: - If over 33 years of age, should not take longer than 1 week without consulting provider. EXCEPTIONS: - Should not be used if taking blood thinners or have bleeding problems. - Do not use if have history of sensitivity/allergy to any of these medications; or history of cardiovascular, ulcer, kidney, liver disease or diabetes unless approved by provider. - Do  not exceed recommended dose or frequency. ~ 08/12/2011 10:15:38PM Page 1 of 1 [email or web address]

## 2011-08-29 ENCOUNTER — Encounter: Payer: Self-pay | Admitting: Family Medicine

## 2011-08-29 ENCOUNTER — Ambulatory Visit (INDEPENDENT_AMBULATORY_CARE_PROVIDER_SITE_OTHER): Payer: BC Managed Care – PPO | Admitting: Family Medicine

## 2011-08-29 VITALS — BP 118/78 | HR 76 | Temp 97.7°F | Ht 68.0 in | Wt 218.0 lb

## 2011-08-29 DIAGNOSIS — L03019 Cellulitis of unspecified finger: Secondary | ICD-10-CM

## 2011-08-29 DIAGNOSIS — M545 Low back pain, unspecified: Secondary | ICD-10-CM

## 2011-08-29 DIAGNOSIS — G8929 Other chronic pain: Secondary | ICD-10-CM

## 2011-08-29 DIAGNOSIS — M5412 Radiculopathy, cervical region: Secondary | ICD-10-CM

## 2011-08-29 NOTE — Patient Instructions (Addendum)
Start taking biotin over the counter vitamin for nail health. Wrap middle finger with gauze and keep clean. Don't pick at it. Increase walking daily. May increase meloxicam to daily. If worsening pain or any numbness/weakness, please let me know.  If you want referral to back doctor, let me know.

## 2011-08-29 NOTE — Progress Notes (Signed)
Subjective:    Patient ID: Gabriel [name], male    DOB: [birth year], 66 y.o.   MRN: [id number]  HPI CC: f/u paronychia  Seen 08/13/2011 by Dr[facility] with acute R middle finger paronychia, s/p I&D and placed on keflex course.  Took 10 d keflex course.  Has used bacitracin ointment.  Has actually been picking finger since then.  Had R middle finger stepped on as a child, since then has had bad nail growth.  Over last month, worse R shooting pain and tingling down right arm.  Denies numbness/weakness of R arm.  No significant neck pain.    Continued lower back pain but stable.  H/o multiple imaging studies and surgeries for back, latest MRI thoracic spine 09/2010 showing thoracic DDD and kyphosis with minimal anterolisthesis T2/3 and bilat foraminal stenosis Latest surgery L3/4 diskectomy 07/2010.  Known cervical disc disease with h/o herniation, last MRI in our records was 2005 - reviewed:  C3/4 - small disc herniation, mild bilat foraminal narrowing C4/5 - small disc herniation with mod canal stenosis, mild bilat foraminal narrowing C5/6 - spondylitic bulge contacting cord, mild canal stenosis, mod bilat foraminal narrowing C6/7 - spondylitic bulge, mild canal stenosis, mild bilat foraminal narrowing Has been recommended in past against surgery.  H/o seronegative arthritis, prior saw rheum and on MTX but unsure how much this actually helped, has been off for years.  Asks about f/u with rheum.  Wt Readings from Last 3 Encounters:  08/29/11 218 lb (98.884 kg)  08/13/11 217 lb (98.431 kg)  06/02/11 216 lb 12 oz (98.317 kg)     Past Medical History  Diagnosis Date  . Seronegative arthritis     RA, previously on MTX, RF <7, ANA nl (rheum Dr. Betsy Coder)  . Hematuria - cause not known     s/p urology workup 2010, nl CT, cystoscopy (doesn't want rpt), cytology  . History of chicken pox   . Depression with anxiety     h/o remote hospitalization for depression  . GERD  (gastroesophageal reflux disease)   . History of colon polyps 2005    tubular adenoma, on rpt no more (2010)  . OSA (obstructive sleep apnea)     nasal cpap machine at 12cm, ambien, daytime drowsiness attributed to OSA, last changed dextroamphetamine to nuvigil, previously tried provigil  . Torn rotator cuff     bilateral, s/p L repair  . Chronic lower back pain     DDD, prior on lyrica, gabapentin, lumbar stenosis with persistent numbness R ant thigh, ESI didn't help  . BPH (benign prostatic hypertrophy)     h/o prostate nodule, followed by Dr. Isabel Caprice  . Insomnia   . Cervical neck pain with evidence of disc disease     h/o bulging discs    Past Surgical History  Procedure Date  . Achilles tendon repair 2002  . Cataract extraction 2007    left  . Rotator cuff repair 2008    Left (Dr. Holley Dexter in [place], now [place])  . Lumbar disc surgery 07/2010    L3/4 diskectomy (Dr. Wyline Mood at [facility])  . Colonoscopy 11/2008    diverticulosis, rpt 5 yrs  . Mri thoracic 09/2010    thoracic DDD/kyphosis, minimal anterolisthesis T2/3 with B foraminal narrowing  . Upper gastrointestinal endoscopy 02/2005    minimal GERD, small gastric polyp, tiny HH  . Dobutamine stress echo 08/2006    negative for ischemia   Review of Systems Per HPI    Objective:  Physical Exam  Nursing note and vitals reviewed. Constitutional: He appears well-developed and well-nourished. No distress.  Neurological:       Grip strength intact Sensation intact BUE strength intact BUE  Skin: Skin is warm and dry. No erythema.       R middle finger nail with portion of lateral nail at cuticle with absent nail.  No tenderness to palpation.       Assessment & Plan:

## 2011-08-31 DIAGNOSIS — M5412 Radiculopathy, cervical region: Secondary | ICD-10-CM | POA: Insufficient documentation

## 2011-08-31 NOTE — Assessment & Plan Note (Addendum)
Acute paronychia resolved after I&D and keflex course. Now nail deformity present, although unsure how much is chronic vs how much attributable to continued picking at nail. advised to stop picking, dressed nail with gauze and paper tape mainly to prevent continued picking. Recommended starting biotin for nail health.

## 2011-08-31 NOTE — Assessment & Plan Note (Signed)
Recommended daily walking.

## 2011-08-31 NOTE — Assessment & Plan Note (Signed)
With known disc disease.  Pain has been worsening, however neurologically nonfocal exam. Recommended increasing mobic to daily as needed. If any weakness, numbness or worsening despite above, low threshold to reimage (last cervical MRI 2005).  Also discussed possible referral back to spine MD for discussion on other treatment options.  Has had ESI in past.

## 2011-11-10 ENCOUNTER — Ambulatory Visit (INDEPENDENT_AMBULATORY_CARE_PROVIDER_SITE_OTHER): Payer: BC Managed Care – PPO

## 2011-11-10 DIAGNOSIS — Z23 Encounter for immunization: Secondary | ICD-10-CM

## 2011-11-28 ENCOUNTER — Other Ambulatory Visit: Payer: Self-pay | Admitting: *Deleted

## 2011-11-28 MED ORDER — TERAZOSIN HCL 1 MG PO CAPS: 1.0000 mg | ORAL_CAPSULE | Freq: Every day | ORAL | Status: DC

## 2011-11-28 MED ORDER — OMEPRAZOLE 20 MG PO CPDR: 20.0000 mg | DELAYED_RELEASE_CAPSULE | Freq: Every day | ORAL | Status: DC

## 2011-11-28 MED ORDER — ZOLPIDEM TARTRATE 10 MG PO TABS: 5.0000 mg | ORAL_TABLET | Freq: Every evening | ORAL | Status: DC | PRN

## 2011-11-28 NOTE — Telephone Encounter (Signed)
OK to refill? Wants 90 supply sent to [facility].

## 2011-11-28 NOTE — Telephone Encounter (Signed)
plz phone in. 

## 2011-11-29 MED ORDER — ZOLPIDEM TARTRATE 10 MG PO TABS: 5.0000 mg | ORAL_TABLET | Freq: Every evening | ORAL | Status: DC | PRN

## 2011-11-29 NOTE — Telephone Encounter (Signed)
Rx faxed to [facility] and shredded.

## 2011-11-29 NOTE — Telephone Encounter (Signed)
Printed and placed in [name]'s box. 

## 2011-11-29 NOTE — Telephone Encounter (Signed)
Will need to be written and faxed to [facility] per policy. Fax # is [phone]

## 2011-12-06 ENCOUNTER — Encounter: Payer: Self-pay | Admitting: Family Medicine

## 2011-12-06 ENCOUNTER — Ambulatory Visit (INDEPENDENT_AMBULATORY_CARE_PROVIDER_SITE_OTHER): Payer: BC Managed Care – PPO | Admitting: Family Medicine

## 2011-12-06 VITALS — BP 128/84 | HR 64 | Temp 98.2°F | Wt 220.8 lb

## 2011-12-06 DIAGNOSIS — M545 Low back pain, unspecified: Secondary | ICD-10-CM

## 2011-12-06 DIAGNOSIS — G8929 Other chronic pain: Secondary | ICD-10-CM

## 2011-12-06 DIAGNOSIS — M5136 Other intervertebral disc degeneration, lumbar region: Secondary | ICD-10-CM | POA: Insufficient documentation

## 2011-12-06 DIAGNOSIS — M5137 Other intervertebral disc degeneration, lumbosacral region: Secondary | ICD-10-CM

## 2011-12-06 NOTE — Patient Instructions (Addendum)
For now let's increase amitriptyline to 50mg  daily (2 pills daily) - watch for dry mouth. Keep an eye on amitriptyline - let me know if we want to try a medicine with less side effects called nortriptyline. Pass by Marino's office for referral to spine doctor to establish and to see if any better options available to treat back pain. Good to see you today, call us with questions.

## 2011-12-06 NOTE — Progress Notes (Signed)
  Subjective:    Patient ID: Gabriel [name], male    DOB: 01/24/1946, 66 y.o.   MRN: [id number]  HPI CC: f/u back pain  paronychia healed.  Taking meloxicam PRN for back pain.   Interested in increasing amitriptyline.    Continued lumbar back pain but stable. H/o multiple imaging studies for back, latest MRI thoracic spine 09/2010 showing thoracic DDD and kyphosis with minimal anterolisthesis T2/3 and bilat foraminal stenosis. Last ESI was 02/2011, did not have significant improvement. Lumbar surgery - L3/4 diskectomy 07/2010.  Known cervical disc disease with h/o herniation, last MRI in our records was 2005 - reviewed:  C3/4 - small disc herniation, mild bilat foraminal narrowing  C4/5 - small disc herniation with mod canal stenosis, mild bilat foraminal narrowing  C5/6 - spondylitic bulge contacting cord, mild canal stenosis, mod bilat foraminal narrowing  C6/7 - spondylitic bulge, mild canal stenosis, mild bilat foraminal narrowing  Has been recommended in past against surgery.   H/o seronegative arthritis, prior saw rheum and on MTX but unsure how much this actually helped, has been off for years. Has not recently f/u with Rheum.  Lab Results  Component Value Date   CREATININE 1.2 05/26/2011   Past Medical History  Diagnosis Date  . Seronegative arthritis     RA, previously on MTX, RF <7, ANA nl (rheum Dr. Betsy Coder)  . Hematuria - cause not known     s/p urology workup 2010, nl CT, cystoscopy (doesn't want rpt), cytology  . History of chicken pox   . Depression with anxiety     h/o remote hospitalization for depression  . GERD (gastroesophageal reflux disease)   . History of colon polyps 2005    tubular adenoma, on rpt no more (2010)  . OSA (obstructive sleep apnea)     nasal cpap machine at 12cm, ambien, daytime drowsiness attributed to OSA, last changed dextroamphetamine to nuvigil, previously tried provigil  . Torn rotator cuff     bilateral, s/p L repair  . Chronic lower  back pain     DDD, prior on lyrica, gabapentin, lumbar stenosis with persistent numbness R ant thigh, ESI didn't help  . BPH (benign prostatic hypertrophy)     h/o prostate nodule, followed by Dr. Isabel Caprice  . Insomnia   . Cervical neck pain with evidence of disc disease     h/o bulging discs    Past Surgical History  Procedure Date  . Achilles tendon repair 2002  . Cataract extraction 2007    left  . Rotator cuff repair 2008    Left (Dr. Holley Dexter in [place], now [place])  . Lumbar disc surgery 07/2010    L3/4 diskectomy ([facility] Wyline [facility] at [facility])  . Colonoscopy 11/2008    diverticulosis, rpt 5 yrs  . Mri thoracic 09/2010    thoracic DDD/kyphosis, minimal anterolisthesis T2/3 with B foraminal narrowing  . Upper gastrointestinal endoscopy 02/2005    minimal GERD, small gastric polyp, tiny HH  . Dobutamine stress echo 08/2006    negative for ischemia     Review of Systems Per HPI    Objective:   Physical Exam  Nursing note and vitals reviewed. Constitutional: He appears well-developed and well-nourished. No distress.  Neurological:       Grip strength intact Sensation intact BUE strength intact BUE       Assessment & Plan:

## 2011-12-06 NOTE — Assessment & Plan Note (Addendum)
Pt with known lumbar DDD and stenosis with persistent numbness R ant thigh s/p L3/4 discectomy (07/2011) Pt requests referral to spine for revisiting of lower back therapeutic options.  Have placed referral today. Increase amitriptyline to 50mg  nightly, discussed possible change to nortriptyline.

## 2012-01-22 ENCOUNTER — Other Ambulatory Visit: Payer: Self-pay | Admitting: Family Medicine

## 2012-02-06 ENCOUNTER — Other Ambulatory Visit: Payer: Self-pay | Admitting: Family Medicine

## 2012-03-14 ENCOUNTER — Encounter: Payer: Self-pay | Admitting: Family Medicine

## 2012-04-23 ENCOUNTER — Other Ambulatory Visit: Payer: Self-pay | Admitting: *Deleted

## 2012-04-23 MED ORDER — ZOLPIDEM TARTRATE 10 MG PO TABS: 5.0000 mg | ORAL_TABLET | Freq: Every evening | ORAL | Status: DC | PRN

## 2012-04-23 NOTE — Telephone Encounter (Signed)
plz phone in. 

## 2012-04-24 MED ORDER — ZOLPIDEM TARTRATE 10 MG PO TABS: 5.0000 mg | ORAL_TABLET | Freq: Every evening | ORAL | Status: DC | PRN

## 2012-04-24 NOTE — Telephone Encounter (Signed)
Rx faxed and shredded.

## 2012-04-24 NOTE — Telephone Encounter (Signed)
Printed and placed in [name]'s box. 

## 2012-04-24 NOTE — Telephone Encounter (Signed)
Will need Rx printed so it can be faxed to [facility]. They require this for controlled meds.

## 2012-05-23 ENCOUNTER — Telehealth: Payer: Self-pay | Admitting: Family Medicine

## 2012-05-23 NOTE — Telephone Encounter (Signed)
Pt went to get CPap supplies and they would not let him get those without an rx order for those. He is needing that as soon as possible. He wants to use [facility]

## 2012-05-23 NOTE — Telephone Encounter (Signed)
Order faxed to [facility] and patient notified.

## 2012-05-23 NOTE — Telephone Encounter (Addendum)
Ok to do - call or fax.  Please provide with appropriate supplies for nasal CPAP machine at 12 cm pressure Dx: OSA [phone]

## 2012-05-24 ENCOUNTER — Encounter: Payer: Self-pay | Admitting: *Deleted

## 2012-05-31 ENCOUNTER — Encounter: Payer: Self-pay | Admitting: Family Medicine

## 2012-05-31 ENCOUNTER — Ambulatory Visit (INDEPENDENT_AMBULATORY_CARE_PROVIDER_SITE_OTHER): Payer: BC Managed Care – PPO | Admitting: Family Medicine

## 2012-05-31 VITALS — BP 108/64 | HR 90 | Temp 102.1°F | Wt 221.2 lb

## 2012-05-31 DIAGNOSIS — M545 Low back pain, unspecified: Secondary | ICD-10-CM

## 2012-05-31 DIAGNOSIS — J029 Acute pharyngitis, unspecified: Secondary | ICD-10-CM

## 2012-05-31 DIAGNOSIS — G8929 Other chronic pain: Secondary | ICD-10-CM

## 2012-05-31 DIAGNOSIS — R509 Fever, unspecified: Secondary | ICD-10-CM | POA: Insufficient documentation

## 2012-05-31 MED ORDER — OSELTAMIVIR PHOSPHATE 75 MG PO CAPS: 75.0000 mg | ORAL_CAPSULE | Freq: Two times a day (BID) | ORAL | Status: DC

## 2012-05-31 MED ORDER — NORTRIPTYLINE HCL 25 MG PO CAPS: 25.0000 mg | ORAL_CAPSULE | Freq: Every day | ORAL | Status: DC

## 2012-05-31 NOTE — Progress Notes (Signed)
Subjective:    Patient ID: Gabriel [name], male    DOB: 1945-02-11, 67 y.o.   MRN: [id number]  HPI CC:  ST, fever, malaise  1 wk ago went to [place].  Last week went to beach.  Returned yesterday and acutely started feeling worse.  1d h/o dry throat, sore throat, raspy, fevers/chills, clammy, weakness.  Off and on dry cough.  Nauseated.  Some chest congestion.  Progressive deterioration, not sudden onset.  No HA, head congestion, diarrhea, vomiting, ear or tooth pain, myalgias.  No abd pain.  Since last seen here, saw Dr. Wyline Mood and Consuella Lose PA at [facility] for chronic back pain.  Rpt MRI.  Had rpt steroid shot into spine (~04/16/2012) with good pain relief.  No recent acutely worsened back pain.  Requests trial of nortriptyline instead of amitriptyline to minimize side effects.  No sick contacts at home. No recent tick bites.  No new rashes No h/o allergies.  Past Medical History  Diagnosis Date  . Seronegative arthritis     RA, previously on MTX, RF <7, ANA nl (rheum Dr. Betsy Coder)  . Hematuria - cause not known     s/p urology workup 2010, nl CT, cystoscopy (doesn't want rpt), cytology  . History of chicken pox   . Depression with anxiety     h/o remote hospitalization for depression  . GERD (gastroesophageal reflux disease)   . History of colon polyps 2005    tubular adenoma, on rpt no more (2010)  . OSA (obstructive sleep apnea)     nasal cpap machine at 12cm, ambien, daytime drowsiness attributed to OSA, last changed dextroamphetamine to nuvigil, previously tried provigil  . Torn rotator cuff     bilateral, s/p L repair  . Chronic lower back pain     DDD, prior on lyrica, gabapentin, lumbar stenosis with persistent numbness R ant thigh, ESI didn't help  . BPH (benign prostatic hypertrophy)     h/o prostate nodule, followed by Dr. Isabel Caprice yearly  . Insomnia   . Cervical neck pain with evidence of disc disease     h/o bulging discs  . Prostate nodule      Review of  Systems Per HPI    Objective:   Physical Exam  Nursing note and vitals reviewed. Constitutional: He appears well-developed and well-nourished. No distress.  HENT:  Head: Normocephalic and atraumatic.  Right Ear: Hearing, tympanic membrane, external ear and ear canal normal.  Left Ear: Hearing, tympanic membrane, external ear and ear canal normal.  Nose: Nose normal. No mucosal edema or rhinorrhea. Right sinus exhibits no maxillary sinus tenderness and no frontal sinus tenderness. Left sinus exhibits no maxillary sinus tenderness and no frontal sinus tenderness.  Mouth/Throat: Uvula is midline and mucous membranes are normal. Posterior oropharyngeal edema and posterior oropharyngeal erythema present. No oropharyngeal exudate or tonsillar abscesses.  Pale nasal mucosa  Eyes: Conjunctivae and EOM are normal. Pupils are equal, round, and reactive to light. No scleral icterus.  Neck: Normal range of motion. Neck supple.  Cardiovascular: Normal rate, regular rhythm, normal heart sounds and intact distal pulses.   No murmur heard. Pulmonary/Chest: Effort normal and breath sounds normal. No respiratory distress. He has no wheezes. He has no rales.  Musculoskeletal: He exhibits no edema.  Lymphadenopathy:    He has no cervical adenopathy.  Skin: Skin is warm and dry. No rash noted.  No rashes, no tick bites or ticks on entire skin exam      Assessment & Plan:

## 2012-05-31 NOTE — Patient Instructions (Addendum)
Rapid strep test returned normal today.  Blood work today to check on infection. If unreveailing, may just be viral infection that should start improving over next 3-5 days. The fever will make you feel ill.  Take tylenol or ibuprofen (instead of meloxicam) regularly for next 2-3 days to help control fever. Push fluids and plenty of rest over next several days.  Warm herbal teas and salt water gargling. If persistent fever or start feeling worse, please seek urgent care or return to see [place]. I've changed amitriptyline to nortriptyline hopeful for less side effects.

## 2012-05-31 NOTE — Assessment & Plan Note (Signed)
Change amitriptyline to nortriptyline per pt request.

## 2012-05-31 NOTE — Assessment & Plan Note (Addendum)
Fever to 102 in office, sore throat and generalized malaise. Check RST today - negative.  Sent throat culture. Very late in season for flu, no significant myalgias or body aches.  However given rapid onset of illness and respiratory with fever and recent travel, discussed with patient tamiflu - he requests script so I have sent this in. No evidence of tick bites. Lungs clear today. Check CBC, BMP today as well. ?viral exanthem. Discussed this and anticipated course of illness.  If high fever persisting or worsening, to seek urgent care.  Pt agrees with plan.  Given his high fever at presentation, I gave him 600mg  ibuprofen in office x1.  Will route note to Dr. D who I asked to keep an eye for pending blood work tomorrow as I will be out of town.

## 2012-06-01 ENCOUNTER — Encounter: Payer: Self-pay | Admitting: *Deleted

## 2012-06-01 LAB — BASIC METABOLIC PANEL
Calcium: 8.6 mg/dL (ref 8.4–10.5)
GFR: 65.3 mL/min (ref >60.00)
Potassium: 4.2 mEq/L (ref 3.5–5.1)
Sodium: 135 mEq/L (ref 135–145)

## 2012-06-01 LAB — CBC WITH DIFFERENTIAL/PLATELET
Basophils Absolute: 0 10*3/uL (ref 0.0–0.1)
Eosinophils Absolute: 0.2 10*3/uL (ref 0.0–0.7)
Eosinophils Relative: 2.4 % (ref 0.0–5.0)
HCT: 44.3 % (ref 39.0–52.0)
Lymphs Abs: 0.5 10*3/uL — ABNORMAL LOW (ref 0.7–4.0)
MCHC: 33.6 g/dL (ref 30.0–36.0)
MCV: 91.4 fl (ref 78.0–100.0)
Monocytes Absolute: 0.9 10*3/uL (ref 0.1–1.0)
Neutrophils Relative %: 81.7 % — ABNORMAL HIGH (ref 43.0–77.0)
Platelets: 152 10*3/uL (ref 150.0–400.0)
RDW: 14.4 % (ref 11.5–14.6)
WBC: 9.4 10*3/uL (ref 4.5–10.5)

## 2012-06-24 ENCOUNTER — Telehealth: Payer: Self-pay | Admitting: Family Medicine

## 2012-06-24 NOTE — Telephone Encounter (Signed)
Received request for script for CPAP machine - is he still on 12cm at night?  Does he use O2 at night?

## 2012-06-25 NOTE — Telephone Encounter (Signed)
Message left for patient to return my call.  

## 2012-06-27 NOTE — Telephone Encounter (Signed)
Patient called and left message that he is still on CPAP at 12cm at night.

## 2012-06-28 NOTE — Telephone Encounter (Signed)
No record of RDI done - will fax request back, if denied, may need repeat sleep study as last done 01/2010.

## 2012-08-03 ENCOUNTER — Other Ambulatory Visit: Payer: Self-pay | Admitting: Family Medicine

## 2012-08-03 MED ORDER — METHOCARBAMOL 750 MG PO TABS: 750.0000 mg | ORAL_TABLET | Freq: Three times a day (TID) | ORAL | Status: DC | PRN

## 2012-08-03 NOTE — Telephone Encounter (Signed)
Will do 1 month supply with 3 refills.  This should only be PRN med.

## 2012-08-03 NOTE — Telephone Encounter (Signed)
Ok to refill?  Requesting 90 day supply.

## 2012-09-10 ENCOUNTER — Encounter: Payer: Self-pay | Admitting: Family Medicine

## 2012-09-10 ENCOUNTER — Ambulatory Visit (INDEPENDENT_AMBULATORY_CARE_PROVIDER_SITE_OTHER): Payer: BC Managed Care – PPO | Admitting: Family Medicine

## 2012-09-10 ENCOUNTER — Encounter: Payer: Self-pay | Admitting: *Deleted

## 2012-09-10 VITALS — BP 136/82 | HR 84 | Temp 98.4°F | Wt 224.5 lb

## 2012-09-10 DIAGNOSIS — M25519 Pain in unspecified shoulder: Secondary | ICD-10-CM

## 2012-09-10 DIAGNOSIS — M25512 Pain in left shoulder: Secondary | ICD-10-CM | POA: Insufficient documentation

## 2012-09-10 DIAGNOSIS — G4710 Hypersomnia, unspecified: Secondary | ICD-10-CM

## 2012-09-10 DIAGNOSIS — G4733 Obstructive sleep apnea (adult) (pediatric): Secondary | ICD-10-CM

## 2012-09-10 DIAGNOSIS — G4700 Insomnia, unspecified: Secondary | ICD-10-CM

## 2012-09-10 MED ORDER — HYDROCODONE-ACETAMINOPHEN 5-325 MG PO TABS: 1.0000 | ORAL_TABLET | Freq: Three times a day (TID) | ORAL | Status: DC | PRN

## 2012-09-10 MED ORDER — METHOCARBAMOL 750 MG PO TABS: 750.0000 mg | ORAL_TABLET | Freq: Three times a day (TID) | ORAL | Status: DC | PRN

## 2012-09-10 MED ORDER — TERAZOSIN HCL 2 MG PO CAPS: 2.0000 mg | ORAL_CAPSULE | Freq: Every day | ORAL | Status: DC

## 2012-09-10 NOTE — Assessment & Plan Note (Signed)
In known h/o bilateral supraspinatus tear s/p L sided repair 2008. Anticipate crick in neck has led to rotator cuff tendinitis flare. Treat conservatively with starting meloxicam 15mg  daily for 1 wk with food, start stretching exercises (provided along with resistance band from [facility] pt advisor), and hydrocodone prn breakthrough pain after meloxicam. To update me if persistent or worsening L shoulder pain for referral to ortho in h/o same sided repair. Doubt cervical radiculopathy today.

## 2012-09-10 NOTE — Patient Instructions (Signed)
I think you have rotator cuff inflammation. Treat with stretching exercises provided today with resistance band, start meloxicam 15mg  daily for 1 week with food then as needed, and may use hydrocodone as needed for breakthrough pain. Good to see you today, call us with questions. Let me know if not improving - let me know for referral to orthopedist for further evaluation.

## 2012-09-10 NOTE — Progress Notes (Signed)
Subjective:    Patient ID: Gabriel [name], male    DOB: 03-12-1945, 67 y.o.   MRN: [id number]  HPI CC: L shoulder pain  H/o bilateral rotator cuff tears (supraspinatus) s/p L repair in 2008.  Left handed.  Woke up Friday morning with sore neck on left side.  This has progressed to sharp stabbing L shoulder and L lateral upper arm pain.  Pain stays upper arm and shoulder.  Denies numbness or weakness of L arm.  No fevers/chills.  No midline neck pain.  No inciting trauma or falls.  Has been swimming, but only 4 laps/day.  Has tried sports rubs and oxycodone for pain which helped sleep.  Known cervical disc disease with h/o herniation, last MRI in our records was 2005 - reviewed:  C3/4 - small disc herniation, mild bilat foraminal narrowing  C4/5 - small disc herniation with mod canal stenosis, mild bilat foraminal narrowing  C5/6 - spondylitic bulge contacting cord, mild canal stenosis, mod bilat foraminal narrowing  C6/7 - spondylitic bulge, mild canal stenosis, mild bilat foraminal narrowing  Has been recommended in past against surgery.  More trouble sleeping.  compliant with CPAP for OSA - uses nasal machine with pressure of 12cm.  Takes ambien regularly.  Would like to see sleep apnea specialist - neurologist.  BPH - requests increased terazosin dose to 2mg  daily.  Medications and allergies reviewed and updated in chart.  Past histories reviewed and updated if relevant as below. Patient Active Problem List   Diagnosis Date Noted  . Left shoulder pain 09/10/2012  . Fever 05/31/2012  . DDD (degenerative disc disease), lumbar 12/06/2011  . Right cervical radiculopathy 08/31/2011  . Acquired deformity of nail 08/13/2011  . Healthcare maintenance 06/04/2011  . Insomnia 03/04/2011  . Right shoulder pain 01/06/2011  . Chronic lower back pain   . Hypersomnolence 12/02/2010  . Hematuria - cause not known   . Depression with anxiety   . GERD (gastroesophageal reflux disease)   .  OSA (obstructive sleep apnea)   . Torn rotator cuff   . Colon polyp    Past Medical History  Diagnosis Date  . Seronegative arthritis     RA, previously on MTX, RF <7, ANA nl (rheum Dr. Betsy Coder)  . Hematuria - cause not known     s/p urology workup 2010, nl CT, cystoscopy (doesn't want rpt), cytology  . History of chicken pox   . Depression with anxiety     h/o remote hospitalization for depression  . GERD (gastroesophageal reflux disease)   . History of colon polyps 2005    tubular adenoma, on rpt no more (2010)  . OSA (obstructive sleep apnea)     nasal cpap machine at 12cm, ambien, daytime drowsiness attributed to OSA, last changed dextroamphetamine to nuvigil, previously tried provigil  . Torn rotator cuff     bilateral, s/p L repair  . Chronic lower back pain     DDD, prior on lyrica, gabapentin, lumbar stenosis with persistent numbness R ant thigh, ESI didn't help  . BPH (benign prostatic hypertrophy)     h/o prostate nodule, followed by Dr. Isabel Caprice yearly  . Insomnia   . Cervical neck pain with evidence of disc disease     h/o bulging discs  . Prostate nodule    Past Surgical History  Procedure Laterality Date  . Achilles tendon repair  2002  . Cataract extraction  2007    left  . Rotator cuff repair  2008  Left (Dr. Holley Dexter in [place], now [place])  . Lumbar disc surgery  07/2010    L3/4 diskectomy (Dr. Wyline Mood at [facility])  . Colonoscopy  11/2008    diverticulosis, rpt 5 yrs  . Mri thoracic  09/2010    thoracic DDD/kyphosis, minimal anterolisthesis T2/3 with B foraminal narrowing  . Upper gastrointestinal endoscopy  02/2005    minimal GERD, small gastric polyp, tiny HH  . Dobutamine stress echo  08/2006    negative for ischemia   History  Substance Use Topics  . Smoking status: Never Smoker   . Smokeless tobacco: Never Used  . Alcohol Use: No   Family History  Problem Relation Age of Onset  . Arthritis Mother   . COPD Father     smoker  . Cancer Neg Hx   .  Stroke Neg Hx   . Coronary artery disease Neg Hx   . Diabetes Neg Hx    Allergies  Allergen Reactions  . Amoxicillin Nausea And Vomiting  . Erythromycin Nausea And Vomiting  . Sulfa Drugs Cross Reactors Other (See Comments)    Thrush   Current Outpatient Prescriptions on File Prior to Visit  Medication Sig Dispense Refill  . buPROPion (WELLBUTRIN XL) 300 MG 24 hr tablet TAKE 1 TABLET (300 MG TOTAL) BY MOUTH DAILY  90 tablet  3  . meloxicam (MOBIC) 15 MG tablet Take 1 tablet (15 mg total) by mouth daily as needed.  90 tablet  0  . nortriptyline (PAMELOR) 25 MG capsule Take 1 capsule (25 mg total) by mouth at bedtime.  90 capsule  3  . omeprazole (PRILOSEC) 20 MG capsule TAKE 1 CAPSULE BY MOUTH DAILY  90 capsule  3  . zolpidem (AMBIEN) 10 MG tablet Take 0.5 tablets (5 mg total) by mouth at bedtime as needed.  90 tablet  0  . Biotin 3 MG TABS Take 1 tablet by mouth daily.      Marland Kitchen nystatin (MYCOSTATIN) 100000 UNIT/ML suspension Take 5 mLs (500,000 Units total) by mouth 4 (four) times daily as needed.  180 mL  1   No current facility-administered medications on file prior to visit.    Review of Systems Per HPI    Objective:   Physical Exam  Nursing note and vitals reviewed. Constitutional: He appears well-developed and well-nourished. No distress.  Musculoskeletal: He exhibits no edema.  No midline cervical spine tenderness Neg spurling bilaterally FROM at neck, pain reproduced with lateral flexion to the left. FROM at shoulders Tender to palpation at supraspinatus belly on left and left lateral mid upper arm No pain to palpation rest of shoulder. No pain with testing of SITS with int/ext rotation. Neg empty can sign. Neg impingement No pain with rotation of humeral head in GH joint. Neg speed test.  Neurological: He has normal strength. No sensory deficit.  Reflex Scores:      Bicep reflexes are 2+ on the right side and 2+ on the left side. 5/5 strength BUE  Skin: Skin is  warm and dry. No rash noted.  Psychiatric: He has a normal mood and affect.       Assessment & Plan:

## 2012-09-10 NOTE — Assessment & Plan Note (Signed)
Reports complaince with CPAP.  H/o insomnia as well as hypersomnia in past, prior followed by neurology.  Requests referral back to neurology to re establish.  I will refer to Dr. Vickey Huger.

## 2012-09-13 ENCOUNTER — Telehealth: Payer: Self-pay | Admitting: *Deleted

## 2012-09-13 DIAGNOSIS — M25512 Pain in left shoulder: Secondary | ICD-10-CM

## 2012-09-13 NOTE — Telephone Encounter (Signed)
Patient called and left message that his arm/shoulder is still bothering him and now his fingertips are swollen. He has been taking the mobic and hydrocodone regularly and is still having the issues. He wanted you to be aware.

## 2012-09-14 MED ORDER — OXYCODONE-ACETAMINOPHEN 5-325 MG PO TABS: 1.0000 | ORAL_TABLET | Freq: Three times a day (TID) | ORAL | Status: DC | PRN

## 2012-09-14 NOTE — Telephone Encounter (Signed)
Patient would like referral. His wife is seeing Dr. Dion Saucier. He says he would like to see him if he can get him in quickly, otherwise he will see whoever can see him the soonest that specializes in shoulders.

## 2012-09-14 NOTE — Telephone Encounter (Signed)
He also requests a stronger pain med if possible. He said he may have something a little stronger at home (he is going to check when he gets home and call back), if not he would like something a little stronger because the vicodin you gave him isn't helping at all.

## 2012-09-14 NOTE — Telephone Encounter (Signed)
Message left for patient to return my call.  

## 2012-09-14 NOTE — Telephone Encounter (Signed)
Printed oxycodone and placed in my out box - ready for pick up. Placed referral - called pt and notified.  [phone] (cell)

## 2012-09-14 NOTE — Telephone Encounter (Signed)
plz offer pt orthoreferral.

## 2012-09-17 NOTE — Telephone Encounter (Signed)
Patient made his own appt for today 09/17/12 at 10am.

## 2012-09-17 NOTE — Telephone Encounter (Signed)
Spoke with patient and notified him Rx was ready for pick up. He said he actually had appt with ortho today, so he was going to hold off on Rx until he found out what Dr.Landau thought. He said he would call me back and let me know if he needed Rx and said if he didn't, then I could shred it. Will await return call.

## 2012-09-19 ENCOUNTER — Encounter: Payer: Self-pay | Admitting: Family Medicine

## 2012-09-20 NOTE — Telephone Encounter (Signed)
Patient did not return call needing medication. Rx shredded.

## 2012-10-10 ENCOUNTER — Institutional Professional Consult (permissible substitution): Payer: Self-pay | Admitting: Neurology

## 2012-10-16 ENCOUNTER — Ambulatory Visit (INDEPENDENT_AMBULATORY_CARE_PROVIDER_SITE_OTHER): Payer: BC Managed Care – PPO | Admitting: Neurology

## 2012-10-16 ENCOUNTER — Encounter: Payer: Self-pay | Admitting: Neurology

## 2012-10-16 VITALS — BP 146/87 | HR 76 | Temp 98.8°F | Ht 68.0 in | Wt 226.0 lb

## 2012-10-16 DIAGNOSIS — R404 Transient alteration of awareness: Secondary | ICD-10-CM

## 2012-10-16 DIAGNOSIS — G4733 Obstructive sleep apnea (adult) (pediatric): Secondary | ICD-10-CM

## 2012-10-16 DIAGNOSIS — R400 Somnolence: Secondary | ICD-10-CM

## 2012-10-16 DIAGNOSIS — Z9989 Dependence on other enabling machines and devices: Secondary | ICD-10-CM

## 2012-10-16 DIAGNOSIS — G4769 Other sleep related movement disorders: Secondary | ICD-10-CM

## 2012-10-16 DIAGNOSIS — G478 Other sleep disorders: Secondary | ICD-10-CM

## 2012-10-16 NOTE — Patient Instructions (Addendum)
Please call in about a month, after you have had your new insurance so we can order your sleep studies: I am going to ask you to come back for a night time CPAP study and then we will keep you the very next day for a series of 5 naps to help diagnose your sleepiness and based on your CPAP study, I will adjust your pressure if needed and issue a new machine.  Based on medicare criteria, we may have to have have a diagnostic portion of your night time sleep study.
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
## 2012-11-01 ENCOUNTER — Ambulatory Visit (INDEPENDENT_AMBULATORY_CARE_PROVIDER_SITE_OTHER): Payer: BC Managed Care – PPO

## 2012-11-01 DIAGNOSIS — Z23 Encounter for immunization: Secondary | ICD-10-CM

## 2012-11-07 ENCOUNTER — Encounter: Payer: Self-pay | Admitting: Family Medicine

## 2012-11-08 ENCOUNTER — Other Ambulatory Visit: Payer: Self-pay | Admitting: *Deleted

## 2012-11-08 MED ORDER — METHOCARBAMOL 750 MG PO TABS: 750.0000 mg | ORAL_TABLET | Freq: Three times a day (TID) | ORAL | Status: DC | PRN

## 2012-11-08 MED ORDER — ZOLPIDEM TARTRATE 10 MG PO TABS: 5.0000 mg | ORAL_TABLET | Freq: Every evening | ORAL | Status: DC | PRN

## 2012-11-08 NOTE — Telephone Encounter (Signed)
Pt would like 3 mth supply

## 2012-11-08 NOTE — Telephone Encounter (Signed)
Rx called in as directed.   

## 2012-11-08 NOTE — Telephone Encounter (Signed)
plz phone in ambien. 

## 2012-11-12 ENCOUNTER — Telehealth: Payer: Self-pay | Admitting: Neurology

## 2012-11-12 DIAGNOSIS — G4733 Obstructive sleep apnea (adult) (pediatric): Secondary | ICD-10-CM

## 2012-11-12 NOTE — Telephone Encounter (Signed)
Patient called stating he now has Medicare and would like to have a sleep study per your office note on 10-16-12, I would like for him to have a overnight CPAP study followed by a nap study. Since he will have a change in his insurance fairly soon in the next month I would like to hold off until he has his new insurance. At the time he may however need a diagnostic portion of his overnight sleep test and rather than a CPAP study overnight he may need a split-night study overnight followed by a nap study.

## 2012-11-13 NOTE — Telephone Encounter (Signed)
I entered a split night sleep study request on this patient, thanks. Please advise him that per Medicare we may have to reevaluate him but the diagnostic portion of the sleep apnea test and therefore we are at this moment not going to pursue is a nap study. We will first try to reevaluate and optimize his obstructive sleep apnea. Huston Foley, MD, PhD [facility])

## 2012-11-21 ENCOUNTER — Ambulatory Visit (INDEPENDENT_AMBULATORY_CARE_PROVIDER_SITE_OTHER): Payer: Medicare Other | Admitting: Neurology

## 2012-11-21 VITALS — BP 120/71 | HR 71 | Ht 68.0 in | Wt 225.0 lb

## 2012-11-21 DIAGNOSIS — R9431 Abnormal electrocardiogram [ECG] [EKG]: Secondary | ICD-10-CM | POA: Diagnosis not present

## 2012-11-21 DIAGNOSIS — G4733 Obstructive sleep apnea (adult) (pediatric): Secondary | ICD-10-CM | POA: Diagnosis not present

## 2012-11-21 DIAGNOSIS — G479 Sleep disorder, unspecified: Secondary | ICD-10-CM

## 2012-11-21 DIAGNOSIS — G4710 Hypersomnia, unspecified: Secondary | ICD-10-CM

## 2012-11-21 DIAGNOSIS — G4761 Periodic limb movement disorder: Secondary | ICD-10-CM

## 2012-11-26 ENCOUNTER — Other Ambulatory Visit: Payer: Self-pay | Admitting: *Deleted

## 2012-11-26 NOTE — Telephone Encounter (Signed)
Patient called upset that meds were sent to [facility] and not [facility]. Wants them sent to [facility] today. Will need written scripts faxed. Has not picked up scripts from [facility].

## 2012-11-27 MED ORDER — METHOCARBAMOL 750 MG PO TABS: 750.0000 mg | ORAL_TABLET | Freq: Three times a day (TID) | ORAL | Status: DC | PRN

## 2012-11-27 MED ORDER — ZOLPIDEM TARTRATE 10 MG PO TABS: 5.0000 mg | ORAL_TABLET | Freq: Every evening | ORAL | Status: DC | PRN

## 2012-11-27 NOTE — Telephone Encounter (Signed)
Rx's faxed to [facility] as requested.

## 2012-11-27 NOTE — Telephone Encounter (Signed)
Printed and placed in [name]'s box. 

## 2012-11-28 ENCOUNTER — Telehealth: Payer: Self-pay | Admitting: Neurology

## 2012-11-28 ENCOUNTER — Encounter: Payer: Self-pay | Admitting: *Deleted

## 2012-11-28 DIAGNOSIS — G4733 Obstructive sleep apnea (adult) (pediatric): Secondary | ICD-10-CM

## 2012-11-28 NOTE — Telephone Encounter (Signed)
Please call and notify patient that the recent sleep study confirmed the diagnosis of OSA. He did very well with CPAP during the study with significant improvement of the respiratory events. Therefore, I would like re-start the patient on CPAP at home. I placed the order in the chart.   Arrange for CPAP set up at home through a DME company of patient's choice and fax/route report to PCP and referring MD (if other than PCP).   The patient will also need a follow up appointment with me in 6-8 weeks post set up that has to be scheduled; help the patient schedule this (in a follow-up slot).   Please re-enforce the importance of compliance with treatment and the need for [place] to monitor compliance data.   Once you have spoken to the patient and scheduled the return appointment, you may close this encounter, thanks,   Huston Foley, MD, PhD [facility] ([facility])

## 2012-11-28 NOTE — Telephone Encounter (Signed)
Left message for patient regarding sleep study results, asked patient to call me back to discuss results and have questions answered.  Explained that a copy of the sleep study was sent to referring physician and copy of study is coming to them in the mail. Also mentioned that I was going to send an order out to a DME company because I was aware the patient would like to rush in order to get feeling better.    Copy of report was faxed to PCP Eustaquio Boyden, MD.

## 2012-12-12 ENCOUNTER — Encounter: Payer: Medicare Other | Admitting: *Deleted

## 2012-12-12 NOTE — Telephone Encounter (Signed)
Patient came in today with his spouse and we met in person and went over his report in detail.  We discussed the reasons for EDS in his case and the plan to have him come start on CPAP therapy with new machine and then follow with Dr. Frances Furbish after at least 30 days of use in order to evaluate his EDS at that time.  If EDS continues, he understands she may recommend MSLT at that time.  We discussed Narcolepsy and EDS and also common forms of treatment including medication and behavioral modification.  Patient understood well.  He has great difficulty with EDS and it affects his daily routine profoundly. Scheduled follow up appt with Dr. Frances Furbish for December, this will allow pt to get testing done prior to end of year if needed.  Pt also understands he will still need a 61-90 day compliance visit.

## 2013-01-14 ENCOUNTER — Encounter: Payer: Self-pay | Admitting: Neurology

## 2013-01-14 ENCOUNTER — Ambulatory Visit (INDEPENDENT_AMBULATORY_CARE_PROVIDER_SITE_OTHER): Payer: Medicare Other | Admitting: Neurology

## 2013-01-14 ENCOUNTER — Encounter (INDEPENDENT_AMBULATORY_CARE_PROVIDER_SITE_OTHER): Payer: Self-pay

## 2013-01-14 VITALS — BP 139/84 | HR 65 | Temp 97.1°F | Ht 68.0 in | Wt 226.0 lb

## 2013-01-14 DIAGNOSIS — Z9989 Dependence on other enabling machines and devices: Secondary | ICD-10-CM

## 2013-01-14 DIAGNOSIS — G4733 Obstructive sleep apnea (adult) (pediatric): Secondary | ICD-10-CM | POA: Diagnosis not present

## 2013-01-14 NOTE — Progress Notes (Signed)
Subjective:    Patient ID: Gabriel [name] is a 67 y.o. male.  HPI   Interim history:   Mr. Petraglia is a very friendly 67 year old right-handed woman with an underlying medical history of arthritis of the left shoulder, degenerative disc disease, cervical radiculopathy, depression with anxiety, reflux disease, and a prior diagnosis of obstructive sleep apnea, who presents for follow-up consultation of his OSA with significant EDS, after his recent split night sleep study. He is accompanied by his wife today.  I first met him on 10/16/12, at which time, he reported problems initiating and maintaining sleep and ongoing severe EDS. He has been taking Pamelor at bedtime (previously Elavil, but had to stop because of severe mouth dryness) as well as Ambien 5 mg at bedtime and has been on it for about 25 years. I recommended re-evaluation of his OSA first with a split night sleep study. He has this test on 11/21/2012 and I went over his sleep study report in detail with him today:  His baseline sleep efficiency was reduced at 60.5% with a latency to sleep of 14 minutes and wake after sleep onset of 36 minutes with moderate to severe sleep fragmentation noted. He had a markedly elevated arousal index at 96.5 per hour. There was a markedly increased percentage of stage I sleep, reduced percentage of stage II sleep, and absence of slow-wave sleep and REM sleep prior to CPAP. He had periodic leg movements of 5.5 per hour. He had frequent, unifocal-appearing PVCs. He had mild to moderate snoring. His AHI was highly elevated at 83.9 per hour. His baseline oxygen saturation was 93%, his nadir was 84% in non-REM sleep. He was then titrated on CPAP. His sleep efficiency was significantly increased and his arousal index improved. He had absence of slow wave sleep and 12.7% of REM sleep. His average oxygen saturation was 93%, his nadir was 82%. He was titrated from 5-11 cm of water pressure and his AHI was reduced to 0  events per hour at the pressure of 10. I also reviewed his compliance data from his machine: From 12/12/2012 through 01/13/2013 which is a total of 33 days during which she used CPAP every day. His average usage was 6 hours and 44 minutes. His residual AHI was 1.7 per hour on the pressure of 10 cm with EPR of 2. He had a fairly high leak at times however. To today, he reports feeling better than before the sleep study. He is compliant with treatment, likes the machine better and the mask as well. She feels, his sleepiness is about the same, but he feels better. He may take a nap without CPAP. He does not keep a very good sleep schedule. He uses the bathroom about once per night.   He was previously diagnosed with OSA in 1998 and was prescribed CPAP of 12 cm it indicated full compliance. I reviewed his CPAP titration study from 01/24/2010 last time. His AHI was reduced to 0 per hour and O2 nadir was 91% for the night. REM latency of 49 minutes, but this was a titration study, and could have shown REM rebound.  He was tried on Provigil years ago, and it did not help and he felt nervous and irritable on it. He denies cataplexy, or hypogogic or hypnopompic hallucinations, but has had sleep paralysis. He had an MSLT about 10 years ago, which showed no evidence of narcolepsy per patient.   His Past Medical History Is Significant For: Past Medical History  Diagnosis  Date  . Seronegative arthritis     RA, previously on MTX, RF <7, ANA nl (rheum Dr. Betsy Coder)  . Hematuria - cause not known     s/p urology workup 2010, nl CT, cystoscopy (doesn't want rpt), cytology  . History of chicken pox   . Depression with anxiety     h/o remote hospitalization for depression  . GERD (gastroesophageal reflux disease)   . History of colon polyps 2005    tubular adenoma, on rpt no more (2010)  . OSA (obstructive sleep apnea)     nasal cpap machine at 12cm, ambien, daytime drowsiness attributed to OSA, last changed  dextroamphetamine to nuvigil, previously tried provigil  . Torn rotator cuff     bilateral, s/p L repair  . Chronic lower back pain     DDD, prior on lyrica, gabapentin, lumbar stenosis with persistent numbness R ant thigh, ESI didn't help  . BPH (benign prostatic hypertrophy)     h/o prostate nodule, followed by Dr. Isabel Caprice yearly  . Insomnia   . Cervical neck pain with evidence of disc disease     h/o bulging discs  . Prostate nodule     His Past Surgical History Is Significant For: Past Surgical History  Procedure Laterality Date  . Achilles tendon repair  2002  . Cataract extraction  2007    left  . Rotator cuff repair  2008    Left (Dr. Holley Dexter in [place], now [place])  . Lumbar disc surgery  07/2010    L3/4 diskectomy (Dr. Wyline Mood at [facility])  . Colonoscopy  11/2008    diverticulosis, rpt 5 yrs  . Mri thoracic  09/2010    thoracic DDD/kyphosis, minimal anterolisthesis T2/3 with B foraminal narrowing  . Upper gastrointestinal endoscopy  02/2005    minimal GERD, small gastric polyp, tiny HH  . Dobutamine stress echo  08/2006    negative for ischemia    His Family History Is Significant For: Family History  Problem Relation Age of Onset  . Arthritis Mother   . COPD Father     smoker  . Cancer Neg Hx   . Stroke Neg Hx   . Coronary artery disease Neg Hx   . Diabetes Neg Hx     His Social History Is Significant For: History   Social History  . Marital Status: Married    Spouse Name: N/A    Number of Children: 2  . Years of Education: N/A   Occupational History  . Clergyman Other    [employer]   Social History Main Topics  . Smoking status: Never Smoker   . Smokeless tobacco: Never Used  . Alcohol Use: No  . Drug Use: No  . Sexual Activity: None   Other Topics Concern  . None   Social History Narrative   Caffeine: avoids   Lives with wife - who is in a power chair.   Occupation: Musician, Lawyer   Edu: masters +   Likes to  travel   Activity: no regular activity, to join Y   Diet: some fruits/vegetables    His Allergies Are:  Allergies  Allergen Reactions  . Amoxicillin Nausea And Vomiting  . Erythromycin Nausea And Vomiting  . Sulfa Drugs Cross Reactors Other (See Comments)    Thrush  :   His Current Medications Are:  Outpatient Encounter Prescriptions as of 01/14/2013  Medication Sig  . Biotin 3 MG TABS Take 1 tablet by mouth daily.  Marland Kitchen buPROPion [facility]  XL) 300 MG 24 hr tablet TAKE 1 TABLET (300 MG TOTAL) BY MOUTH DAILY  . HYDROcodone-acetaminophen (NORCO/VICODIN) 5-325 MG per tablet Take 1 tablet by mouth every 8 (eight) hours as needed (breakthrough pain).  . meloxicam (MOBIC) 15 MG tablet Take 1 tablet (15 mg total) by mouth daily as needed.  . methocarbamol (ROBAXIN) 750 MG tablet Take 1 tablet (750 mg total) by mouth 3 (three) times daily as needed.  . nortriptyline (PAMELOR) 25 MG capsule Take 1 capsule (25 mg total) by mouth at bedtime.  Marland Kitchen nystatin (MYCOSTATIN) 100000 UNIT/ML suspension Take 5 mLs (500,000 Units total) by mouth 4 (four) times daily as needed.  Marland Kitchen omeprazole (PRILOSEC) 20 MG capsule TAKE 1 CAPSULE BY MOUTH DAILY  . oxyCODONE-acetaminophen (ROXICET) 5-325 MG per tablet Take 1 tablet by mouth every 8 (eight) hours as needed for pain.  Marland Kitchen terazosin (HYTRIN) 2 MG capsule Take 1 capsule (2 mg total) by mouth at bedtime.  Marland Kitchen zolpidem (AMBIEN) 10 MG tablet Take 0.5 tablets (5 mg total) by mouth at bedtime as needed for sleep.   Review of Systems:  Out of a complete 14 point review of systems, all are reviewed and negative with the exception of these symptoms as listed below:   Review of Systems  Constitutional: Positive for fatigue.  HENT: Negative.   Eyes: Negative.   Respiratory: Positive for shortness of breath.   Cardiovascular: Negative.   Gastrointestinal: Negative.   Endocrine: Positive for polydipsia.  Genitourinary: Positive for hematuria.  Musculoskeletal: Negative.    Skin: Negative.   Allergic/Immunologic: Negative.   Neurological:       Memory loss  Hematological: Negative.   Psychiatric/Behavioral: Positive for dysphoric mood. The patient is nervous/anxious.     Objective:  Neurologic Exam  Physical Exam Physical Examination:   Filed Vitals:   01/14/13 1114  BP: 139/84  Pulse: 65  Temp: 97.1 F (36.2 C)   General Examination: The patient is a very pleasant 67 y.o. male in no acute distress. He appears well-developed and well-nourished and very well groomed. He is mildly anxious appearing, he is overweight, and there is significant evidence of nailbiting.  HEENT: Normocephalic, atraumatic, pupils are equal, round and reactive to light and accommodation. Funduscopic exam is normal with sharp disc margins noted. Extraocular tracking is good without limitation to gaze excursion or nystagmus noted. Normal smooth pursuit is noted. Hearing is grossly intact. Tympanic membranes are clear bilaterally. Face is symmetric with normal facial animation and normal facial sensation. Speech is clear with no dysarthria noted. There is no hypophonia. There is no lip, neck/head, jaw or voice tremor. Neck is supple with full range of passive and active motion. There are no carotid bruits on auscultation. Oropharynx exam reveals: mild mouth dryness, adequate dental hygiene and moderate airway crowding, due to narrow airway entry and larger tongue. Mallampati is class III. Tongue protrudes centrally and palate elevates symmetrically. Tonsils are 1+.    Chest: Clear to auscultation without wheezing, rhonchi or crackles noted.  Heart: S1+S2+0, regular and normal without murmurs, rubs or gallops noted.   Abdomen: Soft, non-tender and non-distended with normal bowel sounds appreciated on auscultation.  Extremities: There is no pitting edema in the distal lower extremities bilaterally. Pedal pulses are intact.  Skin: Warm and dry without trophic changes noted. There are  mild varicose veins in the right distal leg.  Musculoskeletal: exam reveals no obvious joint deformities, tenderness or joint swelling or erythema.   Neurologically:  Mental status: The patient  is awake, alert and oriented in all 4 spheres. His memory, attention, language and knowledge are appropriate. There is no aphasia, agnosia, apraxia or anomia. Speech is clear with normal prosody and enunciation. Thought process is linear. Mood is congruent and affect is normal.  Cranial nerves are as described above under HEENT exam. In addition, shoulder shrug is normal with equal shoulder height noted. Motor exam: Normal bulk, strength and tone is noted. There is no drift, tremor or rebound. Romberg is negative. Reflexes are 2+ throughout. Toes are downgoing bilaterally. Fine motor skills are intact with normal finger taps, normal hand movements, normal rapid alternating patting, normal foot taps and normal foot agility.  Cerebellar testing shows no dysmetria or intention tremor on finger to nose testing. Heel to shin is unremarkable bilaterally. There is no truncal or gait ataxia.  Sensory exam is intact to light touch, pinprick, vibration, temperature sense in the upper and lower extremities.  Gait, station and balance are unremarkable. No veering to one side is noted. No leaning to one side is noted. Posture is age-appropriate and stance is narrow based. No problems turning are noted. He turns en bloc. Tandem walk is unremarkable.        Assessment and Plan:   In summary, SANTHIAGO COLLINGSWORTH is a very pleasant 67 y.o.-year old male with a history of OSA, associated with significant daytime somnolence, now established on a new CPAP machine with a new treatment pressure as well as a better fitting mask. He has been doing well in terms of adjusting to the new CPAP machine and pressure. He is fully compliant with CPAP therapy. Today I went over his test results with him and his wife in detail as well as his  compliance data and I congratulated him on full compliance with CPAP treatment. He does report some improvement in his sleep but is still quite sleepy during the day. I would like for him to give me an update regarding his sleepiness in about one month. While his recent split-night sleep study did not suggest any findings concerning for narcolepsy, we did not do a nap study the next day because of need for reevaluation and adjustment of treatment for his OSA. Some people with sleep apnea has severe residual daytime somnolence. He does have severe obstructive sleep apnea which is well treated now. If he still has significant daytime somnolence in about a month I have asked him to try Nuvigil and I will prescribe for him at the time. We can certainly get him started with a 30 day free trial. He had some side effects on Provigil and may experience similar side effects with Nuvigil, but it may be worth trying. I would like to see him back in 3 months and at that visit we can decide if we want to pursue further sleep testing with an MSLT to determine if he has findings concerning for narcolepsy. He and his wife were in agreement. Most of my 30 minute visit today was spent in counseling and coordination of care, reviewing test results and reviewing medication.

## 2013-01-14 NOTE — Patient Instructions (Addendum)
Please call in one month to report how you're doing with regard to your daytime sleepiness. We may decide to start you on a medication to help you stay awake during the day called Nuvigil, 150 mg daily. Side effects include (but are not limited to): high blood pressure, headache, nervousness, palpitations, GI upset, tremor. We can start you off on a 30 day free trial. I will send you a prescription for it if we needed.  Please continue using your CPAP regularly. While your insurance requires that you use CPAP at least 4 hours each night on 70% of the nights, I recommend, that you not skip any nights and use it throughout the night if you can. Getting used to CPAP does take time and patience and discipline. Untreated obstructive sleep apnea when it is moderate to severe can have an adverse impact on cardiovascular health and raise her risk for heart disease, arrhythmias, hypertension, congestive heart failure, stroke and diabetes. Untreated obstructive sleep apnea causes sleep disruption, nonrestorative sleep, and sleep deprivation. This can have an impact on your day to day functioning and cause daytime sleepiness and impairment of cognitive function, memory loss, mood disturbance, and problems focussing. Using CPAP regularly can improve these symptoms.  Keep up the good work! I will see you back in 6 months for sleep apnea check up, and if you continue to do well on CPAP I will see you once a year thereafter.   Please remember to try to maintain good sleep hygiene, which means: Keep a regular sleep and wake schedule, try not to exercise or have a meal within 2 hours of your bedtime, try to keep your bedroom conducive for sleep, that is, cool and dark, without light distractors such as an illuminated alarm clock, and refrain from watching TV right before sleep or in the middle of the night and do not keep the TV or radio on during the night. Also, try not to use or play on electronic devices at bedtime, such  as your cell phone, tablet PC or laptop. If you like to read at bedtime on an electronic device, try to dim the background light as much as possible. Do not eat in the middle of the night.   Please cut down on your sweet tea.  Do not drive, if you feel sleepy.

## 2013-01-16 ENCOUNTER — Encounter: Payer: Self-pay | Admitting: Neurology

## 2013-01-16 ENCOUNTER — Other Ambulatory Visit: Payer: Self-pay | Admitting: *Deleted

## 2013-01-16 NOTE — Telephone Encounter (Signed)
Ok to refill? Will need written script to fax to mail order.

## 2013-01-16 NOTE — Progress Notes (Signed)
Quick Note:  I reviewed the patient's CPAP compliance data from 12/12/2012 to 01/10/2013, which is a total of 30 days, during which time the patient used CPAP every day. The average usage for all days was 6 hours and 49 minutes. The percent used days greater than 4 hours was 100 %, indicating excellent compliance. The residual AHI was 1.7 per hour, indicating an adequate treatment pressure of 10 cwp with EPR of 2. I will review this data with the patient at the next office visit which is scheduled routinely for 05/27/2012, provide feedback and additional troubleshooting if need be. Based on this compliance report, it looks like he is doing well with treatment of his sleep apnea.  Huston Foley, MD, PhD [facility] ([facility]   ______

## 2013-01-17 ENCOUNTER — Encounter: Payer: Self-pay | Admitting: Neurology

## 2013-01-17 ENCOUNTER — Encounter: Payer: Self-pay | Admitting: Family Medicine

## 2013-01-17 MED ORDER — ZOLPIDEM TARTRATE 10 MG PO TABS: 5.0000 mg | ORAL_TABLET | Freq: Every evening | ORAL | Status: DC | PRN

## 2013-01-17 NOTE — Telephone Encounter (Signed)
Printed and placed in [name]'s box. 

## 2013-01-18 NOTE — Telephone Encounter (Signed)
Rx faxed as directed. 

## 2013-03-11 ENCOUNTER — Telehealth: Payer: Self-pay | Admitting: *Deleted

## 2013-03-11 MED ORDER — HYDROXYZINE HCL 25 MG PO TABS: 25.0000 mg | ORAL_TABLET | Freq: Three times a day (TID) | ORAL | Status: DC | PRN

## 2013-03-11 NOTE — Telephone Encounter (Signed)
plz check with patient-  Any reason why liquid hydroxyzine?  I can send hydroxyzine capsules for him if he'd like. May send in hydroxyzine 25mg  one tab PO TID PRN itching, sedation precautions #30.

## 2013-03-11 NOTE — Telephone Encounter (Signed)
Spoke with patient and he said he had always just used liquid in the past, but was fine with the capsules. Sent in as directed. Patient asked if his wife could use his medication as well. I advised that meds should not be shared. He asked that message be sent to Dr. Deborra Medina for his wife. Message sent as requested.

## 2013-03-11 NOTE — Telephone Encounter (Signed)
Pt calling stating he's been itching since this dry weather and has tried everything OTC and would like a rx for something like liquid hydralazine? [facility] [address]

## 2013-03-29 ENCOUNTER — Telehealth: Payer: Self-pay | Admitting: *Deleted

## 2013-03-29 DIAGNOSIS — G4710 Hypersomnia, unspecified: Secondary | ICD-10-CM

## 2013-03-29 DIAGNOSIS — G4730 Sleep apnea, unspecified: Principal | ICD-10-CM

## 2013-03-29 MED ORDER — ARMODAFINIL 150 MG PO TABS: 150.0000 mg | ORAL_TABLET | Freq: Every day | ORAL | Status: DC

## 2013-03-29 NOTE — Telephone Encounter (Signed)
Called and spoke to patient's spouse who took the following message.  1.  There are two weeks worth of Nuvigil samples for patient at [facility] and he can pick these up on Monday. 2.  The [employer] rep said he may try using the 30 day free sample card by asking to be treated like a cash pay patient for that prescription and not using his insurance.  She stated that some pharmacies/pharmacists will do that and some will not.  She said if his pharmacy won't do it, he may want to try a different pharmacy to see if they will.

## 2013-03-29 NOTE — Telephone Encounter (Signed)
His daytime somnolence may in part be medication related as well. Nevertheless, we discussed starting Nuvigil last time: Nuvigil, 150 mg daily. Side effects include (but are not limited to): high blood pressure, headache, nervousness, palpitations, GI upset, tremor. We can start him off on a 30 day free trial. Please ask patient if he can pick up prescription and the card for a free 30 day supply.

## 2013-03-29 NOTE — Telephone Encounter (Signed)
I called and spoke with the patient.  Relayed info provided by Dr Rexene Alberts.  He [facility] for [facility].  He will go online to [email or web address] and print discount voucher.  He will contact us in about 2 weeks if he feels med is working to get a new Rx sent to mail order and will also let us know if he feels med is not working or if he is having and adverse side effects.

## 2013-03-29 NOTE — Telephone Encounter (Signed)
Gabriel [name] calls today to let us know that he is getting back with a message for Dr. Rexene Alberts.  He reports that since his visit in December, he continues to suffer with daytime sleepiness and is just not feeling restored as he would expect on CPAP therapy.  He is using the therapy regularly.  He mentions that Dr. Rexene Alberts discussed Nuvigil with him at last appointment.  He is interested in pursuing medication to help treat his EDS.  He asks if there is a generic form.  I mention Provigil has a generic but not Nuvigil, he says he tried provigil about 10 years ago with no effect.    His [facility] is [facility] and of course for 90 day supply, he uses [facility].  He would like someone to call him back to let him know what Dr. Rexene Alberts responds.

## 2013-04-03 ENCOUNTER — Other Ambulatory Visit: Payer: Self-pay | Admitting: Family Medicine

## 2013-04-03 NOTE — Telephone Encounter (Signed)
Ok to refill? Hasn't had CPE since 2013.

## 2013-04-12 ENCOUNTER — Ambulatory Visit (INDEPENDENT_AMBULATORY_CARE_PROVIDER_SITE_OTHER): Payer: Medicare Other | Admitting: Family Medicine

## 2013-04-12 ENCOUNTER — Telehealth: Payer: Self-pay | Admitting: Neurology

## 2013-04-12 ENCOUNTER — Encounter: Payer: Self-pay | Admitting: Family Medicine

## 2013-04-12 VITALS — BP 130/68 | HR 76 | Temp 98.3°F | Wt 229.8 lb

## 2013-04-12 DIAGNOSIS — G4710 Hypersomnia, unspecified: Secondary | ICD-10-CM

## 2013-04-12 DIAGNOSIS — G4730 Sleep apnea, unspecified: Principal | ICD-10-CM

## 2013-04-12 DIAGNOSIS — G4733 Obstructive sleep apnea (adult) (pediatric): Secondary | ICD-10-CM

## 2013-04-12 DIAGNOSIS — R5381 Other malaise: Secondary | ICD-10-CM

## 2013-04-12 DIAGNOSIS — R5383 Other fatigue: Secondary | ICD-10-CM | POA: Diagnosis not present

## 2013-04-12 DIAGNOSIS — E538 Deficiency of other specified B group vitamins: Secondary | ICD-10-CM | POA: Diagnosis not present

## 2013-04-12 LAB — COMPREHENSIVE METABOLIC PANEL
ALBUMIN: 4.2 g/dL (ref 3.5–5.2)
ALK PHOS: 87 U/L (ref 39–117)
ALT: 17 U/L (ref 0–53)
AST: 22 U/L (ref 0–37)
BUN: 15 mg/dL (ref 6–23)
CHLORIDE: 104 meq/L (ref 96–112)
CO2: 27 mEq/L (ref 19–32)
Calcium: 9 mg/dL (ref 8.4–10.5)
Creat: 1.01 mg/dL (ref 0.50–1.35)
Glucose, Bld: 78 mg/dL (ref 70–99)
Potassium: 4.7 mEq/L (ref 3.5–5.3)
SODIUM: 140 meq/L (ref 135–145)
TOTAL PROTEIN: 6.7 g/dL (ref 6.0–8.3)
Total Bilirubin: 0.8 mg/dL (ref 0.2–1.2)

## 2013-04-12 LAB — CBC WITH DIFFERENTIAL/PLATELET
Basophils Absolute: 0 10*3/uL (ref 0.0–0.1)
Basophils Relative: 0 % (ref 0–1)
EOS PCT: 3 % (ref 0–5)
Eosinophils Absolute: 0.3 10*3/uL (ref 0.0–0.7)
HEMATOCRIT: 43.5 % (ref 39.0–52.0)
Hemoglobin: 14.9 g/dL (ref 13.0–17.0)
LYMPHS ABS: 2 10*3/uL (ref 0.7–4.0)
LYMPHS PCT: 21 % (ref 12–46)
MCH: 30 pg (ref 26.0–34.0)
MCHC: 34.3 g/dL (ref 30.0–36.0)
MCV: 87.5 fL (ref 78.0–100.0)
MONO ABS: 1 10*3/uL (ref 0.1–1.0)
MONOS PCT: 10 % (ref 3–12)
Neutro Abs: 6.4 10*3/uL (ref 1.7–7.7)
Neutrophils Relative %: 66 % (ref 43–77)
Platelets: 199 10*3/uL (ref 150–400)
RBC: 4.97 MIL/uL (ref 4.22–5.81)
RDW: 14.7 % (ref 11.5–15.5)
WBC: 9.7 10*3/uL (ref 4.0–10.5)

## 2013-04-12 MED ORDER — MODAFINIL 100 MG PO TABS: 100.0000 mg | ORAL_TABLET | Freq: Two times a day (BID) | ORAL | Status: DC

## 2013-04-12 NOTE — Progress Notes (Signed)
BP 130/68  Pulse 76  Temp(Src) 98.3 F (36.8 C) (Oral)  Wt 229 lb 12 oz (104.214 kg)  SpO2 97%   CC: trouble sleeping, fatigue  Subjective:    Patient ID: Gabriel [name], male    DOB: 14-Dec-1945, 69 y.o.   MRN: [id number]  HPI: Gabriel [name] is a 68 y.o. male presenting on 04/12/2013 with fatigue during day and Insomnia   Gabriel [name] presents today to discuss concerns over fatigue and sleep issues ongoing over last several months.  Main concern is excessive daytime sleepiness with inability to sleep at night.  Feeling excessively fatigued as well as noticing some dyspnea.  Significant post prandial sleepiness. Some mental fogginess with inability to concentrate during the day.  Involuntary naps during the day. Noticing increasing anxiety/anger/frustration over increasing fatigue.   Lacks motivation to get exercise. Decreased activity recently, not exercising as much as he prior was. Denies fever, chest pain, lightheadedness, palpitations or headache.  No bleeding from anywhere.  He has h/o severe OSA on CPAP and insomnia. CPAP machine setting at 10cm, uses ambien 5mg  (1/2 tab bedtime) and nortriptyline 25mg , daytime drowsiness attributed to OSA, has been on dextroamphetamine then nuvigil, previously tried provigil which didn't help.  Wt Readings from Last 3 Encounters:  04/12/13 229 lb 12 oz (104.214 kg)  01/14/13 226 lb (102.513 kg)  11/22/12 225 lb (102.059 kg)  Body mass index is 34.94 kg/(m^2).  Relevant past medical, surgical, family and social history reviewed and updated as indicated.  Allergies and medications reviewed and updated. Current Outpatient Prescriptions on File Prior to Visit  Medication Sig  . Armodafinil (NUVIGIL) 150 MG tablet Take 1 tablet (150 mg total) by mouth daily. To be used with card for free 1st 30 day prescription.  Marland Kitchen buPROPion (WELLBUTRIN XL) 300 MG 24 hr tablet TAKE 1 TABLET (300 MG TOTAL) BY MOUTH DAILY  . hydrOXYzine (ATARAX/VISTARIL) 25 MG tablet  Take 1 tablet (25 mg total) by mouth 3 (three) times daily as needed.  . meloxicam (MOBIC) 15 MG tablet Take 1 tablet (15 mg total) by mouth daily as needed.  . methocarbamol (ROBAXIN) 750 MG tablet Take 1 tablet (750 mg total) by mouth 3 (three) times daily as needed.  . nortriptyline (PAMELOR) 25 MG capsule TAKE 1 CAPSULE AT BEDTIME  . nystatin (MYCOSTATIN) 100000 UNIT/ML suspension Take 5 mLs (500,000 Units total) by mouth 4 (four) times daily as needed.  Marland Kitchen omeprazole (PRILOSEC) 20 MG capsule TAKE 1 CAPSULE DAILY  . terazosin (HYTRIN) 2 MG capsule Take 1 capsule (2 mg total) by mouth at bedtime.  Marland Kitchen zolpidem (AMBIEN) 10 MG tablet Take 0.5 tablets (5 mg total) by mouth at bedtime as needed for sleep.  . Biotin 3 MG TABS Take 1 tablet by mouth daily.  Marland Kitchen HYDROcodone-acetaminophen (NORCO/VICODIN) 5-325 MG per tablet Take 1 tablet by mouth every 8 (eight) hours as needed (breakthrough pain).  Marland Kitchen oxyCODONE-acetaminophen (ROXICET) 5-325 MG per tablet Take 1 tablet by mouth every 8 (eight) hours as needed for pain.   No current facility-administered medications on file prior to visit.    Review of Systems Per HPI unless specifically indicated above    Objective:    BP 130/68  Pulse 76  Temp(Src) 98.3 F (36.8 C) (Oral)  Wt 229 lb 12 oz (104.214 kg)  SpO2 97%  Physical Exam  Nursing note and vitals reviewed. Constitutional: He appears well-developed and well-nourished. No distress.  HENT:  Mouth/Throat: Oropharynx is clear and moist. No  oropharyngeal exudate.  Cardiovascular: Normal rate, regular rhythm, normal heart sounds and intact distal pulses.   No murmur heard. Pulmonary/Chest: Effort normal and breath sounds normal. No respiratory distress. He has no wheezes. He has no rales.  Musculoskeletal: He exhibits no edema.  Skin: Skin is warm and dry. No rash noted.  Psychiatric:  anxious       Assessment & Plan:   Problem List Items Addressed This Visit   Fatigue - Primary      Persistent fatigue ongoing over last several months.  Initially attributed to OSA, however this is being treated successfully with CPAP machine. Despite this he has persistent daytime fatigue and EDS. Nuvigil 150mg  not helping.  Has samples for 250mg , and is working with neurology re insurance coverage. I will check for reversible causes of fatigue today including CMP, CBC, TSH and B12.  Pt aware medicare may not cover B12 but agrees to have it checked regardless. I wanted to check Vit D but cost of $70 and medicare will not cover - we will hold on checking for now. I recommended he try to incorporate and limit white starches and simple carbs in diet.  Appreciate neuro care of patient.  Pt requests to be worked in next week. I don't think there's cardiac or pulmonary cause to his symptoms. If above unrevealing, consider further eval of anxiety/depression.    Relevant Orders      Vitamin B12      Comprehensive metabolic panel      TSH      CBC with Differential   OSA (obstructive sleep apnea)   Relevant Orders      TSH      CBC with Differential       Follow up plan: Return as needed, for follow up.

## 2013-04-12 NOTE — Telephone Encounter (Signed)
Please advise patient that we provided him with samples of Nuvigil 250 mg because we do not have samples of 150 mg strength. Also I did not prescribe Provigil because he reported side effects in the past with it. If he wishes to try Provigil I can prescribe that again. We will fax Rx to his pharmacy. Please schedule with NP for a sooner appt.  Please print Rx and fax to pharmacy, thx

## 2013-04-12 NOTE — Patient Instructions (Signed)
Let's check blood work today to rule out reversible causes of fatigue. Continue nuvigil and work with neuro on prescription. Good to see you today, call us with questions.

## 2013-04-12 NOTE — Assessment & Plan Note (Addendum)
Persistent fatigue ongoing over last several months.  Initially attributed to OSA, however this is being treated successfully with CPAP machine. Despite this he has persistent daytime fatigue and EDS. Nuvigil 150mg  not helping.  Has samples for 250mg , and is working with neurology re insurance coverage. I will check for reversible causes of fatigue today including CMP, CBC, TSH and B12.  Pt aware medicare may not cover B12 but agrees to have it checked regardless. I wanted to check Vit D but cost of $70 and medicare will not cover - we will hold on checking for now. I recommended he try to incorporate and limit white starches and simple carbs in diet.  Appreciate neuro care of patient.  Pt requests to be worked in next week. I don't think there's cardiac or pulmonary cause to his symptoms. If above unrevealing, consider further eval of anxiety/depression.

## 2013-04-12 NOTE — Telephone Encounter (Signed)
Pt calls in distress and frustration regarding symptoms of excessive daytime sleepiness and fatigue despite using Nuvigil.  He DEMANDS to be seen ASAP-TODAY to discuss this in detail.  He says that he can barely move during the day because he is so tired.  Pt has very pressured speech, attempted to calm him down, advised him that Dr. Rexene Alberts has a full schedule, but his request would be submitted to her asap.  He then [employer] frustration of not being able to get the Nuvigil from his pharmacy because he is 73.  Why was the prescription written for Nuvigil 150 and the samples were 250?  Is this the reason why the medication is not working? Can he be prescribed Provigil because he used to take it several years ago?

## 2013-04-12 NOTE — Progress Notes (Signed)
Pre visit review using our clinic review tool, if applicable. No additional management support is needed unless otherwise documented below in the visit note. 

## 2013-04-13 ENCOUNTER — Encounter: Payer: Self-pay | Admitting: Family Medicine

## 2013-04-13 ENCOUNTER — Other Ambulatory Visit: Payer: Self-pay | Admitting: Family Medicine

## 2013-04-13 DIAGNOSIS — E538 Deficiency of other specified B group vitamins: Secondary | ICD-10-CM | POA: Insufficient documentation

## 2013-04-13 LAB — VITAMIN B12: VITAMIN B 12: 284 pg/mL (ref 211–911)

## 2013-04-13 LAB — TSH: TSH: 0.619 u[IU]/mL (ref 0.350–4.500)

## 2013-04-13 MED ORDER — CYANOCOBALAMIN 500 MCG PO TABS: 500.0000 ug | ORAL_TABLET | Freq: Every day | ORAL | Status: DC

## 2013-04-15 ENCOUNTER — Encounter: Payer: Self-pay | Admitting: *Deleted

## 2013-04-15 NOTE — Telephone Encounter (Addendum)
I called pt and spoke to him.  He is ok to see NP, will check about cpap download.  Told pt about 04-17-13 appt with CM/NP at 1100, unfortunately someone else took before I could place appt.  I LMVM mobile that same day with L Lam, NP at 1030.  Need call back to confirm.

## 2013-04-16 DIAGNOSIS — N138 Other obstructive and reflux uropathy: Secondary | ICD-10-CM | POA: Diagnosis not present

## 2013-04-16 DIAGNOSIS — R3129 Other microscopic hematuria: Secondary | ICD-10-CM | POA: Diagnosis not present

## 2013-04-16 DIAGNOSIS — N402 Nodular prostate without lower urinary tract symptoms: Secondary | ICD-10-CM | POA: Diagnosis not present

## 2013-04-16 DIAGNOSIS — N401 Enlarged prostate with lower urinary tract symptoms: Secondary | ICD-10-CM | POA: Diagnosis not present

## 2013-04-16 DIAGNOSIS — N139 Obstructive and reflux uropathy, unspecified: Secondary | ICD-10-CM | POA: Diagnosis not present

## 2013-04-17 ENCOUNTER — Telehealth: Payer: Self-pay | Admitting: Nurse Practitioner

## 2013-04-17 ENCOUNTER — Encounter: Payer: Self-pay | Admitting: Nurse Practitioner

## 2013-04-17 ENCOUNTER — Ambulatory Visit (INDEPENDENT_AMBULATORY_CARE_PROVIDER_SITE_OTHER): Payer: Medicare Other | Admitting: Nurse Practitioner

## 2013-04-17 VITALS — BP 128/70 | HR 72 | Ht 67.0 in | Wt 229.0 lb

## 2013-04-17 DIAGNOSIS — G4730 Sleep apnea, unspecified: Secondary | ICD-10-CM | POA: Diagnosis not present

## 2013-04-17 DIAGNOSIS — G4710 Hypersomnia, unspecified: Secondary | ICD-10-CM

## 2013-04-17 DIAGNOSIS — G4719 Other hypersomnia: Secondary | ICD-10-CM

## 2013-04-17 MED ORDER — MODAFINIL 100 MG PO TABS: 100.0000 mg | ORAL_TABLET | Freq: Two times a day (BID) | ORAL | Status: DC

## 2013-04-17 NOTE — Telephone Encounter (Signed)
Pt called states that [facility] has not recd the prescription for Provigil and wants NP/LL to return his call concerning this matter. Also states that we have to call to give prior authorization for Nuvigil at [phone]. Please call pt once this is confirmed along with the other prescription matter. Thanks

## 2013-04-17 NOTE — Telephone Encounter (Signed)
He wanted to use Provigil, since the Nuvigil was so expensive.  Since we are not sure if he will be on it long term, please send to his local pharmacy instead of [facility]. Thanks.

## 2013-04-17 NOTE — Patient Instructions (Signed)
Dr. Rexene Alberts has sent a prescription to [facility] for Provigil.  We are ordering another sleep study, to test sleep latency.  Someone will call to schedule.  Follow up appointment with Dr. Rexene Alberts in April, on the 20th as planned.

## 2013-04-17 NOTE — Telephone Encounter (Signed)
Gabriel [name], by viewing OV notes, it looks like a Rx was to be sent for Provigil, however, this is no longer on the med list.  Should he still be taking 100mg  BID?  Also he is asking for a Prior Auth on Nuvigil, but it seems as though he will be maintained on Provigil?  (Provigil typically also needs a prior auth; ins will likely not approve both meds)  I will be happy to do whatever is needed, just want to clarify info.  Thanks.

## 2013-04-17 NOTE — Telephone Encounter (Signed)
I called and spoke with patient.  He was agreeable to try Provigil and have Rx called in to local pharmacy.  Says he will try it, and if that does not work, he will let us know.  Asked that the Rx be called into [facility].

## 2013-04-17 NOTE — Progress Notes (Addendum)
PATIENT: Gabriel [name] DOB: 1945-02-24  REASON FOR VISIT: follow up for OSA/excessive daytime sleepiness HISTORY FROM: patient  HISTORY OF PRESENT ILLNESS: [name] is a very friendly 68 year old right-handed woman with an underlying medical history of arthritis of the left shoulder, degenerative disc disease, cervical radiculopathy, depression with anxiety, reflux disease, and a prior diagnosis of obstructive sleep apnea, who presents for follow-up consultation of his OSA with significant EDS, after his recent split night sleep study. He is accompanied by his wife today.   01/14/13 (SA): I first met him on 10/16/12, at which time, he reported problems initiating and maintaining sleep and ongoing severe EDS. He has been taking Pamelor at bedtime (previously Elavil, but had to stop because of severe mouth dryness) as well as Ambien 5 mg at bedtime and has been on it for about 25 years. I recommended re-evaluation of his OSA first with a split night sleep study. He has this test on 11/21/2012 and I went over his sleep study report in detail with him today:  His baseline sleep efficiency was reduced at 60.5% with a latency to sleep of 14 minutes and wake after sleep onset of 36 minutes with moderate to severe sleep fragmentation noted. He had a markedly elevated arousal index at 96.5 per hour. There was a markedly increased percentage of stage I sleep, reduced percentage of stage II sleep, and absence of slow-wave sleep and REM sleep prior to CPAP. He had periodic leg movements of 5.5 per hour. He had frequent, unifocal-appearing PVCs. He had mild to moderate snoring. His AHI was highly elevated at 83.9 per hour. His baseline oxygen saturation was 93%, his nadir was 84% in non-REM sleep. He was then titrated on CPAP. His sleep efficiency was significantly increased and his arousal index improved. He had absence of slow wave sleep and 12.7% of REM sleep. His average oxygen saturation was 93%, his nadir  was 82%. He was titrated from 5-11 cm of water pressure and his AHI was reduced to 0 events per hour at the pressure of 10. I also reviewed his compliance data from his machine: From 12/12/2012 through 01/13/2013 which is a total of 33 days during which she used CPAP every day. His average usage was 6 hours and 44 minutes. His residual AHI was 1.7 per hour on the pressure of 10 cm with EPR of 2. He had a fairly high leak at times however.  To today, he reports feeling better than before the sleep study. He is compliant with treatment, likes the machine better and the mask as well. She feels, his sleepiness is about the same, but he feels better. He may take a nap without CPAP. He does not keep a very good sleep schedule. He uses the bathroom about once per night.  He was previously diagnosed with OSA in 1998 and was prescribed CPAP of 12 cm it indicated full compliance. I reviewed his CPAP titration study from 01/24/2010 last time. His AHI was reduced to 0 per hour and O2 nadir was 91% for the night. REM latency of 49 minutes, but this was a titration study, and could have shown REM rebound.  He was tried on Provigil years ago, and it did not help and he felt nervous and irritable on it. He denies cataplexy, or hypogogic or hypnopompic hallucinations, but has had sleep paralysis. He had an MSLT about 10 years ago, which showed no evidence of narcolepsy per patient.   UPDATE 04/17/13 (LL): Patient comes  in for sooner revisit, has continued daytime sleepiness despite full compliance with CPAP use.  His report shows 100% compliance with average 7 hours of use, set pressure 10 cmH20, EPR level 2, AHI: 2.7.  He has been using Nuvigil samples, saying he still feels extremely tired as if he cannot move sometimes, and he falls asleep if he sits.  He even fell asleep while visiting a shut-in (pastoral visit) recently which was very embarrassing. His wife describes this has been a problem for him as long as she has known  him.  He is not able to afford the [facility] prescription.  He would like to try Provigil again, it was sent to his mail-order pharmacy on 3/6.  He is willing to do another multiple sleep latency test to try to diagnose the problem.  REVIEW OF SYSTEMS: Full 14 system review of systems performed and notable only for: extreme fatigue, daytime sleepiness, Insomnia, apnea, frequency of urination, blood in urine, urgency, back pain, depression, anxiety.   ALLERGIES: Allergies  Allergen Reactions  . Amoxicillin Nausea And Vomiting  . Erythromycin Nausea And Vomiting  . Sulfa Drugs Cross Reactors Other (See Comments)    Thrush    HOME MEDICATIONS: Outpatient Prescriptions Prior to Visit  Medication Sig Dispense Refill  . Armodafinil (NUVIGIL) 150 MG tablet Take 1 tablet (150 mg total) by mouth daily. To be used with card for free 1st 30 day prescription.  30 tablet  0  . buPROPion (WELLBUTRIN XL) 300 MG 24 hr tablet TAKE 1 TABLET (300 MG TOTAL) BY MOUTH DAILY  90 tablet  3  . meloxicam (MOBIC) 15 MG tablet Take 1 tablet (15 mg total) by mouth daily as needed.  90 tablet  0  . methocarbamol (ROBAXIN) 750 MG tablet Take 1 tablet (750 mg total) by mouth 3 (three) times daily as needed.  270 tablet  3  . nortriptyline (PAMELOR) 25 MG capsule TAKE 1 CAPSULE AT BEDTIME  90 capsule  2  . nystatin (MYCOSTATIN) 100000 UNIT/ML suspension Take 5 mLs (500,000 Units total) by mouth 4 (four) times daily as needed.  180 mL  1  . omeprazole (PRILOSEC) 20 MG capsule TAKE 1 CAPSULE DAILY  90 capsule  2  . zolpidem (AMBIEN) 10 MG tablet Take 0.5 tablets (5 mg total) by mouth at bedtime as needed for sleep.  45 tablet  1  . Biotin 3 MG TABS Take 1 tablet by mouth daily.      . cyanocobalamin 500 MCG tablet Take 1 tablet (500 mcg total) by mouth daily.      Marland Kitchen HYDROcodone-acetaminophen (NORCO/VICODIN) 5-325 MG per tablet Take 1 tablet by mouth every 8 (eight) hours as needed (breakthrough pain).  30 tablet  0  .  hydrOXYzine (ATARAX/VISTARIL) 25 MG tablet Take 1 tablet (25 mg total) by mouth 3 (three) times daily as needed.  30 tablet  0  . modafinil (PROVIGIL) 100 MG tablet Take 1 tablet (100 mg total) by mouth 2 (two) times daily. Take 1 pill in AM and may take another pill at noon. Second dose no later than 3 PM  60 tablet  3  . oxyCODONE-acetaminophen (ROXICET) 5-325 MG per tablet Take 1 tablet by mouth every 8 (eight) hours as needed for pain.  20 tablet  0  . terazosin (HYTRIN) 2 MG capsule Take 1 capsule (2 mg total) by mouth at bedtime.  90 capsule  3   No facility-administered medications prior to visit.     PHYSICAL  EXAM  Filed Vitals:   04/17/13 1049  BP: 128/70  Pulse: 72  Height: '5\' 7"'  (1.702 m)  Weight: 229 lb (103.874 kg)   Body mass index is 35.86 kg/(m^2).  General Examination: The patient is a very pleasant 68 y.o. male in no acute distress. He appears well-developed and well-nourished and very well groomed. He is anxious appearing, he is overweight, and there is significant evidence of nailbiting.   HEENT: Normocephalic, atraumatic, pupils are equal, round and reactive to light and accommodation.  Extraocular tracking is good without limitation to gaze excursion or nystagmus noted. Normal smooth pursuit is noted. Hearing is grossly intact.  Face is symmetric with normal facial animation and normal facial sensation. Speech is clear with no dysarthria noted. Neck is supple with full range of passive and active motion. There are no carotid bruits on auscultation. Oropharynx exam reveals: mild mouth dryness, adequate dental hygiene and moderate airway crowding, due to narrow airway entry and larger tongue. Mallampati is class III. Tongue protrudes centrally and palate elevates symmetrically. Tonsils are 1+.  Chest: Clear to auscultation without wheezing, rhonchi or crackles noted.  Heart: S1+S2+0, regular and normal without murmurs, rubs or gallops noted.  Neurologically:  Mental  status: The patient is awake, alert and oriented.Marland Kitchen His memory, attention, language and knowledge are appropriate. There is no aphasia, agnosia, apraxia or anomia. Speech is clear, although moderately pressured. Thought process is linear. Mood is congruent and affect is normal.  Cranial nerves are as described above under HEENT exam. In addition, shoulder shrug is normal with equal shoulder height noted.  Motor exam: Normal bulk, strength and tone is noted. There is no drift, tremor or rebound. Romberg is negative. Reflexes are 2+ throughout.  Fine motor skills are intact with normal finger taps, normal hand movements. Cerebellar testing shows no dysmetria or intention tremor on finger to nose testing. Heel to shin is unremarkable bilaterally. There is no truncal or gait ataxia.  Sensory exam is intact to light touch in the upper and lower extremities.  Gait, station and balance are unremarkable. No veering to one side is noted. No leaning to one side is noted. Posture is age-appropriate and stance is narrow based. No problems turning are noted. He turns en bloc. Tandem walk is unremarkable.   ASSESSMENT AND PLAN In summary, Gabriel [name] is a very pleasant 68 y.o.-year old male with a history of OSA, associated with significant daytime somnolence, now established on a new CPAP machine with a new treatment pressure as well as a better fitting mask. He has been doing well in terms of adjusting to the new CPAP machine and pressure. He is fully compliant with CPAP therapy.   He does report some improvement in his sleep but is still extremely sleepy during the day.  While his recent split-night sleep study did not suggest any findings concerning for narcolepsy, we did not do a nap study the next day because of need for reevaluation and adjustment of treatment for his OSA. Some people with sleep apnea has severe residual daytime somnolence. He does have severe obstructive sleep apnea which is well treated now.   He has tried samples of Nuvigil with only mild improvement and cannot afford the cost.  He has not yet received the Provigil yet.  He wishes to pursue further sleep testing with an MSLT to determine if he has findings concerning for narcolepsy.  He and his wife were in agreement. Most of my 30 minute visit today was  spent in counseling and coordination of care, reviewing test results and reviewing medication.  Orders Placed This Encounter  Procedures  . Ambulatory referral to Sleep Studies  . Multiple sleep latency test   Rudi Rummage Serge Main, MSN, NP-C 04/17/2013, 12:41 PM [facility] [address], [place], [place] [address] [phone]  Note: This document was prepared with digital dictation and possible smart phrase technology. Any transcriptional errors that result from this process are unintentional.  I reviewed the above note and documentation by the Nurse Practitioner and agree with the history, physical exam, assessment and plan as outlined above. In order to pursue additional nap testing, he will have to come back for an overnight sleep test during which she uses CPAP throughout the night. The current setting seems to be appropriate. For the MSLT however, I believe he will have to come off of some of his sedating medications. I will review his medication list and make recommendations from there before we can schedule him for MSLT.  Star Age, MD, PhD [facility])

## 2013-04-19 ENCOUNTER — Ambulatory Visit: Payer: Self-pay | Admitting: Nurse Practitioner

## 2013-04-22 ENCOUNTER — Encounter: Payer: Self-pay | Admitting: Neurology

## 2013-04-23 ENCOUNTER — Telehealth: Payer: Self-pay | Admitting: Neurology

## 2013-04-23 DIAGNOSIS — Z9989 Dependence on other enabling machines and devices: Secondary | ICD-10-CM

## 2013-04-23 DIAGNOSIS — R400 Somnolence: Secondary | ICD-10-CM

## 2013-04-23 DIAGNOSIS — G4733 Obstructive sleep apnea (adult) (pediatric): Secondary | ICD-10-CM

## 2013-04-23 NOTE — Telephone Encounter (Signed)
Please call patient: In preparation of his sleep study testing and daytime nap daytime, I would like for him to come off of his nortriptyline (Pamelor), and his Robaxin. Please advise patient to consult with his doctor to taper off these medications. Ideally I would like for him to be off of these medicines for 7-10 days before we can proceed with a daytime nap testing and repeat nighttime testing. These medicines can interfere with sleep and the sleep study results. He would have to have a repeat sleep study at night with his CPAP in place and then stay for the daytime testing. Please also advise him not to be on Provigil or Nuvigil for 7-10 days prior to sleep testing. I will order the tests and we can go ahead and schedule him accordingly.

## 2013-04-24 ENCOUNTER — Telehealth: Payer: Self-pay | Admitting: *Deleted

## 2013-04-24 NOTE — Telephone Encounter (Signed)
Patient called and is having a sleep study on 05/28/13. Needs to come of methocarbamol and nortriptyline for 7 days prior to the study per the doctor and he needs to know if he can stop these cold [place] or if he needs to taper down. If so, he will need directions on how to do that. He said it is okay to leave detailed on home phone.

## 2013-04-24 NOTE — Telephone Encounter (Signed)
Patient has been advised of recommendations for weaning off of medication.  His appt has been tentatively scheduled, pending approval by his primary care physician.

## 2013-04-25 ENCOUNTER — Telehealth: Payer: Self-pay | Admitting: *Deleted

## 2013-04-25 NOTE — Telephone Encounter (Signed)
I called pt and let him know that Provigil 100mg  po bid, he is to take 1 tablet in am and 1 tablet at 1200.  (may take with or without food), not after 1500.  Pt verbalized understanding.  He will start in am.  He is aware that he is to be off this medications 7-10 days prior to sleep study.

## 2013-04-28 NOTE — Telephone Encounter (Signed)
May just stop methocarbamol. For nortriptyline - recommend take 1 pill every other night for 2 weeks then stop.

## 2013-04-29 NOTE — Telephone Encounter (Signed)
Spoke to pt and advised per Dr G; pt verbally expressed understanding.  

## 2013-05-27 ENCOUNTER — Ambulatory Visit: Payer: Medicare Other | Admitting: Neurology

## 2013-07-02 ENCOUNTER — Ambulatory Visit (INDEPENDENT_AMBULATORY_CARE_PROVIDER_SITE_OTHER): Payer: Medicare Other | Admitting: Neurology

## 2013-07-02 ENCOUNTER — Encounter: Payer: Self-pay | Admitting: Neurology

## 2013-07-02 ENCOUNTER — Encounter (INDEPENDENT_AMBULATORY_CARE_PROVIDER_SITE_OTHER): Payer: Self-pay

## 2013-07-02 VITALS — BP 128/83 | HR 85 | Temp 98.5°F | Ht 67.0 in | Wt 217.0 lb

## 2013-07-02 DIAGNOSIS — E669 Obesity, unspecified: Secondary | ICD-10-CM | POA: Diagnosis not present

## 2013-07-02 DIAGNOSIS — Z9989 Dependence on other enabling machines and devices: Secondary | ICD-10-CM

## 2013-07-02 DIAGNOSIS — G4733 Obstructive sleep apnea (adult) (pediatric): Secondary | ICD-10-CM

## 2013-07-02 DIAGNOSIS — G4710 Hypersomnia, unspecified: Secondary | ICD-10-CM | POA: Diagnosis not present

## 2013-07-02 DIAGNOSIS — G4719 Other hypersomnia: Secondary | ICD-10-CM

## 2013-07-02 MED ORDER — MODAFINIL 100 MG PO TABS: 100.0000 mg | ORAL_TABLET | Freq: Two times a day (BID) | ORAL | Status: DC

## 2013-07-02 NOTE — Patient Instructions (Signed)
Please continue using your CPAP regularly. While your insurance requires that you use CPAP at least 4 hours each night on 70% of the nights, I recommend, that you not skip any nights and use it throughout the night if you can. Getting used to CPAP and staying with the treatment long term does take time and patience and discipline. Untreated obstructive sleep apnea when it is moderate to severe can have an adverse impact on cardiovascular health and raise her risk for heart disease, arrhythmias, hypertension, congestive heart failure, stroke and diabetes. Untreated obstructive sleep apnea causes sleep disruption, nonrestorative sleep, and sleep deprivation. This can have an impact on your day to day functioning and cause daytime sleepiness and impairment of cognitive function, memory loss, mood disturbance, and problems focussing. Using CPAP regularly can improve these symptoms.  We will set you up for sleep testing and nap testing. You have to be off of nortriptyline and the methocarbamol for 2 weeks prior to testing and off of the provigil 7-10 days prior to testing. You may take your ambien 5 mg at night.

## 2013-07-02 NOTE — Progress Notes (Signed)
Subjective:    Patient ID: Gabriel [name] is a 68 y.o. male.  HPI    Interim history:   Gabriel [name] is a very friendly 68 year old left-handed woman with an underlying medical history of arthritis of the left shoulder, degenerative disc disease, cervical radiculopathy, depression with anxiety, reflux disease, and a prior diagnosis of obstructive sleep apnea, who presents for follow-up consultation of his OSA with significant residual EDS. He is unaccompanied today. I last saw him on 01/14/13, at which time we talked about his sleep study results and his CPAP compliance which was good. He was well treated in terms of reduction of his residual AHI. I suggested he try Nuvigil as he had some side effects in the past with Provigil. We also talked about potentially bringing him back for further testing with MSLT down the [place]. In the interim, he was seen by our NP, Ms. Lam, on 04/17/2013, at which time he reported that he could not afford Nuvigil and we decided to prescribe Provigil again. He requested to try it again. We also scheduled him for a repeat sleep study followed by a nap study to further delineate and quantify his sleepiness. I asked him to taper off sedating medications with the help of his primary care physician and also discontinue his Provigil 7-10 days prior to the sleep studies. He was given instructions by his primary care physician to come off of his muscle relaxer and nortriptyline. He did not have his sleep studies done and called to cancel those. I reviewed his CPAP compliance down from 03/18/2013 through 04/16/2013 which is a total of 30 days during which time he uses CPAP every night with 80% used days greater than 4 hours at 100%, indicating superb compliance with an average usage of 7 hours. Residual AHI was acceptable at 2.7 per hour and leak was acceptable at 20.1 L per minute at the 95th percentile.  Today, I reviewed his compliance data from 06/02/2013 through 07/01/2013 which is  the last 30 days during which time he was 100% compliant. Average usage was 6 hours and 39 minutes. Residual AHI low at 1 per hour, leak was a time high with the 95th percentile at 34.7 L per minute, pressure at 10 cm with EPR of 2.   Today, he reports, that he was in [place] during the time of his scheduled sleep testing and did not call back to reschedule. He is now taking nortriptyline 25 mg qHS prn, about 3 nights a week and he takes Ambien 5 mg each night. He has been on modafinil 100 mg bid and is wondering, if he could take more. He has had more LBP. He has R shoulder pain. His wife is in rehab and he is currently home alone and has had some bad dreams. He is now on OTC B12 supplementation, per PCP. He feels about 50% better with the modafinil and has noted no SEs. His mother is in [place].   I first met him on 10/16/12, at which time, he reported problems initiating and maintaining sleep and ongoing severe EDS. He has been taking Pamelor at bedtime (previously Elavil, but had to stop because of severe mouth dryness) as well as Ambien 5 mg at bedtime and has been on it for about 25 years. I recommended re-evaluation of his OSA first with a split night sleep study. He has this test on 11/21/2012 and I went over his sleep study report in detail with him previously: His baseline sleep efficiency was  reduced at 60.5% with a latency to sleep of 14 minutes and wake after sleep onset of 36 minutes with moderate to severe sleep fragmentation noted. He had a markedly elevated arousal index at 96.5 per hour. There was a markedly increased percentage of stage I sleep, reduced percentage of stage II sleep, and absence of slow-wave sleep and REM sleep prior to CPAP. He had periodic leg movements of 5.5 per hour. He had frequent, unifocal-appearing PVCs. He had mild to moderate snoring. His AHI was highly elevated at 83.9 per hour. His baseline oxygen saturation was 93%, his nadir was 84% in non-REM sleep. He was then  titrated on CPAP. His sleep efficiency was significantly increased and his arousal index improved. He had absence of slow wave sleep and 12.7% of REM sleep. His average oxygen saturation was 93%, his nadir was 82%. He was titrated from 5-11 cm of water pressure and his AHI was reduced to 0 events per hour at the pressure of 10. I also reviewed his compliance data from his machine: From 12/12/2012 through 01/13/2013 which is a total of 33 days during which she used CPAP every day. His average usage was 6 hours and 44 minutes. His residual AHI was 1.7 per hour on the pressure of 10 cm with EPR of 2. He had a fairly high leak at times however.  To today, he reports feeling better than before the sleep study. He is compliant with treatment, likes the machine better and the mask as well. She feels, his sleepiness is about the same, but he feels better. He may take a nap without CPAP. He does not keep a very good sleep schedule. He uses the bathroom about once per night.  He was previously diagnosed with OSA in 1998 and was prescribed CPAP of 12 cm it indicated full compliance. I reviewed his CPAP titration study from 01/24/2010 last time. His AHI was reduced to 0 per hour and O2 nadir was 91% for the night. REM latency of 49 minutes, but this was a titration study, and could have shown REM rebound.  He was tried on Provigil years ago, and it did not help and he felt nervous and irritable on it. He denies cataplexy, or hypogogic or hypnopompic hallucinations, but has had sleep paralysis. He had an MSLT about 10 years ago, which showed no evidence of narcolepsy per patient.   His Past Medical History Is Significant For: Past Medical History  Diagnosis Date  . Seronegative arthritis     RA, previously on MTX, RF <7, ANA nl (rheum Dr. Donnamarie Poag)  . Hematuria - cause not known     s/p urology workup 2010, nl CT, cystoscopy (doesn't want rpt), cytology  . History of chicken pox   . Depression with anxiety     h/o  remote hospitalization for depression  . GERD (gastroesophageal reflux disease)   . History of colon polyps 2005    tubular adenoma, on rpt no more (2010)  . OSA on CPAP     cpap machine at 10cm, ambien, daytime drowsiness attributed to OSA, last changed dextroamphetamine to nuvigil, previously tried provigil  . Torn rotator cuff     bilateral, s/p L repair  . Chronic lower back pain     DDD, prior on lyrica, gabapentin, lumbar stenosis with persistent numbness R ant thigh, ESI didn't help  . BPH (benign prostatic hypertrophy)     h/o prostate nodule, followed by Dr. Risa Grill yearly  . Insomnia   . Cervical  neck pain with evidence of disc disease     h/o bulging discs  . Prostate nodule     His Past Surgical History Is Significant For: Past Surgical History  Procedure Laterality Date  . Achilles tendon repair  2002  . Cataract extraction  2007    left  . Rotator cuff repair  2008    Left (Dr. Gaylyn Rong in [place], now [place])  . Lumbar disc surgery  07/2010    L3/4 diskectomy ([facility]. Harl [facility] at [facility])  . Colonoscopy  11/2008    diverticulosis, rpt 5 yrs  . Mri thoracic  09/2010    thoracic DDD/kyphosis, minimal anterolisthesis T2/3 with B foraminal narrowing  . Upper gastrointestinal endoscopy  02/2005    minimal GERD, small gastric polyp, tiny HH  . Dobutamine stress echo  08/2006    negative for ischemia    His Family History Is Significant For: Family History  Problem Relation Age of Onset  . Arthritis Mother   . COPD Father     smoker  . Cancer Neg Hx   . Stroke Neg Hx   . Coronary artery disease Neg Hx   . Diabetes Neg Hx     His Social History Is Significant For: History   Social History  . Marital Status: Married    Spouse Name: [name]    Number of Children: 2  . Years of Education: Masters +   Occupational History  . Clergyman Other    [employer]   Social History Main Topics  . Smoking status: Never Smoker   . Smokeless tobacco: Never Used   . Alcohol Use: No  . Drug Use: No  . Sexual Activity: None   Other Topics Concern  . None   Social History Narrative   Caffeine: avoids   Patient is married [name]) Lives with wife - who is in a power chair.   Occupation: Scientist, product/process development, Oceanographer   Edu: masters +   Likes to travel   Activity: no regular activity, to join Y   Diet: some fruits/vegetables   Patient has two children.   Patient is left-handed.    His Allergies Are:  Allergies  Allergen Reactions  . Amoxicillin Nausea And Vomiting  . Erythromycin Nausea And Vomiting  . Sulfa Drugs Cross Reactors Other (See Comments)    Thrush  :   His Current Medications Are:  Outpatient Encounter Prescriptions as of 07/02/2013  Medication Sig  . buPROPion (WELLBUTRIN XL) 300 MG 24 hr tablet TAKE 1 TABLET (300 MG TOTAL) BY MOUTH DAILY  . methocarbamol (ROBAXIN) 750 MG tablet Take 1 tablet (750 mg total) by mouth 3 (three) times daily as needed.  . modafinil (PROVIGIL) 100 MG tablet Take 1 tablet (100 mg total) by mouth 2 (two) times daily.  . nortriptyline (PAMELOR) 25 MG capsule TAKE 1 CAPSULE AT BEDTIME  . nystatin (MYCOSTATIN) 100000 UNIT/ML suspension Take 5 mLs (500,000 Units total) by mouth 4 (four) times daily as needed.  Marland Kitchen omeprazole (PRILOSEC) 20 MG capsule TAKE 1 CAPSULE DAILY  . tamsulosin (FLOMAX) 0.4 MG CAPS capsule 1 capsule daily.  Marland Kitchen zolpidem (AMBIEN) 10 MG tablet Take 0.5 tablets (5 mg total) by mouth at bedtime as needed for sleep.  . [DISCONTINUED] Armodafinil (NUVIGIL) 150 MG tablet Take 1 tablet (150 mg total) by mouth daily. To be used with card for free 1st 30 day prescription.  . [DISCONTINUED] meloxicam (MOBIC) 15 MG tablet Take 1 tablet (15 mg total) by mouth daily as  needed.  :  Review of Systems:  Out of a complete 14 point review of systems, all are reviewed and negative with the exception of these symptoms as listed below:  Review of Systems  Constitutional: Negative.   HENT: Negative.    Eyes: Negative.   Respiratory: Positive for shortness of breath.   Cardiovascular: Negative.   Gastrointestinal: Negative.   Endocrine: Negative.   Genitourinary: Negative.   Musculoskeletal: Positive for back pain.  Skin: Negative.   Allergic/Immunologic: Negative.   Neurological: Negative.   Hematological: Negative.   Psychiatric/Behavioral: Positive for sleep disturbance (e.d.s., apnea).    Objective:  Neurologic Exam  Physical Exam Physical Examination:   Filed Vitals:   07/02/13 1322  BP: 128/83  Pulse: 85  Temp: 98.5 F (36.9 C)   General Examination: The patient is a very pleasant 68 y.o. male in no acute distress. He appears well-developed and well-nourished and very well groomed. He is mildly anxious appearing, he is overweight, and his speech is pressured today.   HEENT: Normocephalic, atraumatic, pupils are equal, round and reactive to light and accommodation. Funduscopic exam is normal with sharp disc margins noted. Extraocular tracking is good without limitation to gaze excursion or nystagmus noted. Normal smooth pursuit is noted. Hearing is grossly intact. Face is symmetric with normal facial animation and normal facial sensation. Speech is clear with no dysarthria noted. There is no hypophonia. There is no lip, neck/head, jaw or voice tremor. Neck is supple with full range of passive and active motion. There are no carotid bruits on auscultation. Oropharynx exam reveals: mild mouth dryness, adequate dental hygiene and moderate airway crowding, due to narrow airway entry and larger tongue. Mallampati is class III. Tongue protrudes centrally and palate elevates symmetrically. Tonsils are 1+.    Chest: Clear to auscultation without wheezing, rhonchi or crackles noted.  Heart: S1+S2+0, regular and normal without murmurs, rubs or gallops noted.   Abdomen: Soft, non-tender and non-distended with normal bowel sounds appreciated on auscultation.  Extremities: There is no  pitting edema in the distal lower extremities bilaterally. Pedal pulses are intact.  Skin: Warm and dry without trophic changes noted. There are mild varicose veins in the right distal leg.  Musculoskeletal: exam reveals no obvious joint deformities, tenderness or joint swelling or erythema.   Neurologically:  Mental status: The patient is awake, alert and oriented in all 4 spheres. His memory, attention, language and knowledge are appropriate. There is no aphasia, agnosia, apraxia or anomia. Speech is clear with normal prosody and enunciation. Thought process is linear. Mood is congruent and affect is normal.  Cranial nerves are as described above under HEENT exam. In addition, shoulder shrug is normal with equal shoulder height noted. Motor exam: Normal bulk, strength and tone is noted. There is no drift, tremor or rebound. Romberg is negative. Reflexes are 2+ throughout. Toes are downgoing bilaterally. Fine motor skills are intact with normal finger taps, normal hand movements, normal rapid alternating patting, normal foot taps and normal foot agility.  Cerebellar testing shows no dysmetria or intention tremor on finger to nose testing. Heel to shin is unremarkable bilaterally. There is no truncal or gait ataxia.  Sensory exam is intact to light touch, pinprick, vibration, temperature sense in the upper and lower extremities.  Gait, station and balance are unremarkable. No veering to one side is noted. No leaning to one side is noted. Posture is age-appropriate and stance is narrow based. No problems turning are noted. He turns en bloc. Tandem  walk is unremarkable.         Assessment and Plan:   In summary, Gabriel [name] is a very pleasant 68 year old male with an underlying medical history of arthritis of the left shoulder, degenerative disc disease, cervical radiculopathy, depression with anxiety, reflux disease, who presents for follow up consultation of his obstructive sleep apnea with  significant residual EDS. He is on CPAP with superb compliance. He had some improvement of his uncontrollable daytime somnolence with the recent addition of provigil twice daily; he feels about 50% better. Today I went over his test results again with him and his compliance data and I congratulated him on his excellent full compliance with CPAP treatment. He does report some improvement in his sleep and his sleepiness. He is still advised to go ahead and schedule his overnight sleep test with next day nap testing to further delineate and quantify his daytime somnolence. He is furthermore advised to come off of his nortriptyline and Robaxin 2 weeks prior to testing as well as be off of Provigil 7-10 days prior to testing. He is advised, not to drive when sleepy and he does pull over to rest when he drives longer distances. He could not afford Nuvigil. He is to continue with Provigil 100 mg twice daily. Sometimes he only takes it once a day. On a rare occasion when he has a convention going on or a [employer] he can take a third dose of Provigil but I would like to not increase the dose to 300 mg on a day-to-day basis and I advised him about. Thankfully he has been able to tolerate it without side effects. I will see him back after his sleep study and nap study testing. He was in agreement. He was advised to continue using his CPAP regularly as he is currently.

## 2013-07-10 ENCOUNTER — Other Ambulatory Visit: Payer: Self-pay | Admitting: Family Medicine

## 2013-07-10 NOTE — Telephone Encounter (Signed)
Last filled 01/19/13

## 2013-07-15 ENCOUNTER — Telehealth: Payer: Self-pay | Admitting: Neurology

## 2013-07-15 ENCOUNTER — Other Ambulatory Visit: Payer: Self-pay | Admitting: Neurology

## 2013-07-15 ENCOUNTER — Other Ambulatory Visit: Payer: Self-pay | Admitting: *Deleted

## 2013-07-15 DIAGNOSIS — Z9989 Dependence on other enabling machines and devices: Secondary | ICD-10-CM

## 2013-07-15 DIAGNOSIS — G4733 Obstructive sleep apnea (adult) (pediatric): Secondary | ICD-10-CM

## 2013-07-15 DIAGNOSIS — E669 Obesity, unspecified: Secondary | ICD-10-CM

## 2013-07-15 DIAGNOSIS — G4719 Other hypersomnia: Secondary | ICD-10-CM

## 2013-07-15 MED ORDER — MODAFINIL 100 MG PO TABS: 100.0000 mg | ORAL_TABLET | Freq: Two times a day (BID) | ORAL | Status: DC

## 2013-07-15 MED ORDER — ZOLPIDEM TARTRATE 10 MG PO TABS: 5.0000 mg | ORAL_TABLET | Freq: Every evening | ORAL | Status: DC | PRN

## 2013-07-15 NOTE — Telephone Encounter (Signed)
Please inquire. I have sent Rx to mail order pharmacy on file.

## 2013-07-15 NOTE — Telephone Encounter (Signed)
Printed and placed in [name]'s box. 

## 2013-07-15 NOTE — Telephone Encounter (Signed)
Rx faxed to [facility]. Script shredded.

## 2013-07-15 NOTE — Telephone Encounter (Signed)
Ok to refill 

## 2013-07-15 NOTE — Telephone Encounter (Signed)
Pt calls to let Dr. Rexene Alberts know that his 56 day rx for Provigil was not received by [facility].  He says they told him that they did not get it.  He is requesting for Dr.Athar to resubmit the rx.

## 2013-07-15 NOTE — Telephone Encounter (Signed)
[facility] requires written Rx to be faxed for controlled Rx's.

## 2013-07-15 NOTE — Telephone Encounter (Signed)
plz phone in. 

## 2013-07-15 NOTE — Telephone Encounter (Signed)
Called pharmacy and they stated that the Rx has to be faxed to the pharmacy because it is a control substance. I faxed Rx to [facility], confirmation received.

## 2013-07-18 ENCOUNTER — Ambulatory Visit (INDEPENDENT_AMBULATORY_CARE_PROVIDER_SITE_OTHER): Payer: Medicare Other | Admitting: Family Medicine

## 2013-07-18 ENCOUNTER — Encounter: Payer: Self-pay | Admitting: Family Medicine

## 2013-07-18 VITALS — BP 138/84 | HR 76 | Temp 98.1°F | Wt 219.0 lb

## 2013-07-18 DIAGNOSIS — M545 Low back pain, unspecified: Secondary | ICD-10-CM

## 2013-07-18 DIAGNOSIS — S335XXA Sprain of ligaments of lumbar spine, initial encounter: Secondary | ICD-10-CM

## 2013-07-18 DIAGNOSIS — S39012A Strain of muscle, fascia and tendon of lower back, initial encounter: Secondary | ICD-10-CM | POA: Insufficient documentation

## 2013-07-18 LAB — POCT URINALYSIS DIPSTICK
Glucose, UA: NEGATIVE
Ketones, UA: NEGATIVE
LEUKOCYTES UA: NEGATIVE
NITRITE UA: NEGATIVE
Spec Grav, UA: >=1.03
Urobilinogen, UA: 0.2
pH, UA: 6

## 2013-07-18 NOTE — Assessment & Plan Note (Signed)
Exam today consistent with R lumbar paraspinous mm strain. Not consistent with lumbar disc disease or radiculopathy today. rec scheduled robaxin, heating pad and provided with stretching exercise from [facility] pt advisor. Update if sxs persist or fail to improve.

## 2013-07-18 NOTE — Patient Instructions (Signed)
I think you have lumbar strain on right with muscle spasm. Treat with heating pad, robaxin and stretching exercises provided today. Let us know if not better with this.

## 2013-07-18 NOTE — Progress Notes (Signed)
Pre visit review using our clinic review tool, if applicable. No additional management support is needed unless otherwise documented below in the visit note. 

## 2013-07-18 NOTE — Progress Notes (Signed)
BP 138/84  Pulse 76  Temp(Src) 98.1 F (36.7 C) (Oral)  Wt 219 lb (99.338 kg)   CC: back pain  Subjective:    Patient ID: Gabriel [name], male    DOB: 16-Mar-1945, 68 y.o.   MRN: [id number]  HPI: Gabriel [name] is a 68 y.o. male presenting on 07/18/2013 for Back Pain   Known chronic lumbar pain from stenosis. Started 2 wks ago after bending over while sitting in car. Some radiation to upper buttock. Denies fevers/chills, leg numbness, weakness, bowel/bladder incontinence.  On methocarbamol as muscle relaxant.  He does continue going to swim classes but it hasn't helped improve pain.  Chronic lower back pain DDD, prior on lyrica, gabapentin, lumbar stenosis with persistent numbness R ant thigh (actually improved), ESI didn't help LUMBAR DISC SURGERY Date: 07/2010 L3/4 diskectomy (Dr. Harl Bowie at [facility])  [facility] Readings from Last 3 Encounters:  07/18/13 219 lb (99.338 kg)  07/02/13 217 lb (98.431 kg)  04/17/13 229 lb (103.874 kg)  Body mass index is 34.29 kg/(m^2).  Past Medical History  Diagnosis Date  . Seronegative arthritis     RA, previously on MTX, RF <7, ANA nl (rheum Dr. Donnamarie Poag)  . Hematuria - cause not known     s/p urology workup 2010, nl CT, cystoscopy (doesn't want rpt), cytology  . History of chicken pox   . Depression with anxiety     h/o remote hospitalization for depression  . GERD (gastroesophageal reflux disease)   . History of colon polyps 2005    tubular adenoma, on rpt no more (2010)  . OSA on CPAP     cpap machine at 10cm, ambien, daytime drowsiness attributed to OSA, last changed dextroamphetamine to nuvigil, previously tried provigil  . Torn rotator cuff     bilateral, s/p L repair  . Chronic lower back pain     DDD, prior on lyrica, gabapentin, lumbar stenosis with persistent numbness R ant thigh, ESI didn't help  . BPH (benign prostatic hypertrophy)     h/o prostate nodule, followed by Dr. Risa Grill yearly  . Insomnia   . Cervical neck pain with evidence  of disc disease     h/o bulging discs  . Prostate nodule      Relevant past medical, surgical, family and social history reviewed and updated as indicated.  Allergies and medications reviewed and updated. Current Outpatient Prescriptions on File Prior to Visit  Medication Sig  . buPROPion (WELLBUTRIN XL) 300 MG 24 hr tablet TAKE 1 TABLET DAILY  . modafinil (PROVIGIL) 100 MG tablet Take 1 tablet (100 mg total) by mouth 2 (two) times daily.  Marland Kitchen nystatin (MYCOSTATIN) 100000 UNIT/ML suspension Take 5 mLs (500,000 Units total) by mouth 4 (four) times daily as needed.  Marland Kitchen omeprazole (PRILOSEC) 20 MG capsule TAKE 1 CAPSULE DAILY  . tamsulosin (FLOMAX) 0.4 MG CAPS capsule 1 capsule daily.  Marland Kitchen zolpidem (AMBIEN) 10 MG tablet Take 0.5 tablets (5 mg total) by mouth at bedtime as needed for sleep.  . methocarbamol (ROBAXIN) 750 MG tablet Take 1 tablet (750 mg total) by mouth 3 (three) times daily as needed.  . nortriptyline (PAMELOR) 25 MG capsule TAKE 1 CAPSULE AT BEDTIME   No current facility-administered medications on file prior to visit.    Review of Systems Per HPI unless specifically indicated above    Objective:    BP 138/84  Pulse 76  Temp(Src) 98.1 F (36.7 C) (Oral)  Wt 219 lb (99.338 kg)  Physical Exam  Nursing note and vitals reviewed. Constitutional: He is oriented to person, place, and time. He appears well-developed and well-nourished. No distress.  Musculoskeletal: He exhibits no edema.  No pain midline spine + R paraspinous mm tenderness Neg SLR bilaterally. No pain with int/ext rotation at hip. Neg FABER. No pain at SIJ, GTB or sciatic notch bilaterally. R tender lower knot superior to SIJ present anticipate muscle spasm  Neurological: He is alert and oriented to person, place, and time.  Skin: Skin is warm and dry.   Results for orders placed in visit on 07/18/13  POCT URINALYSIS DIPSTICK      Result Value Ref Range   Color, UA Yellow     Clarity, UA Clear      Glucose, UA Negative     Bilirubin, UA Small     Ketones, UA Negative     Spec Grav, UA >=1.030     Blood, UA Large     pH, UA 6.0     Protein, UA Trace     Urobilinogen, UA 0.2     Nitrite, UA Negative     Leukocytes, UA Negative        Assessment & Plan:   Problem List Items Addressed This Visit   Lumbar spine strain     Exam today consistent with R lumbar paraspinous mm strain. Not consistent with lumbar disc disease or radiculopathy today. rec scheduled robaxin, heating pad and provided with stretching exercise from [facility] pt advisor. Update if sxs persist or fail to improve.     Other Visit Diagnoses   Right LBP    -  Primary    Relevant Orders       POCT Urinalysis Dipstick (Completed)        Follow up plan: Return if symptoms worsen or fail to improve.

## 2013-07-26 ENCOUNTER — Telehealth: Payer: Self-pay | Admitting: *Deleted

## 2013-07-26 NOTE — Telephone Encounter (Signed)
Ok to try hydrocodone for breakthrough pain.

## 2013-07-26 NOTE — Telephone Encounter (Signed)
Patient called and said he isn't much better. He has been doing the exercises but not consistently. Sitting and bending making it worse. He made an appt with Dr. Harl Bowie at [facility] who did his lumbar surgery but won't be seen until the week after next. He asked if he could take some leftover hydrocodone that he has. I advised him to try regular tylenol first and see how that works for him 500 mg BID along with the methocarbamol and try to do the exercising more regularly. He verbalized understanding. I told him I would call him back if you wanted something different, but that it would probably be Monday since you weren't here. He was okay with that.

## 2013-08-05 DIAGNOSIS — M5137 Other intervertebral disc degeneration, lumbosacral region: Secondary | ICD-10-CM | POA: Diagnosis not present

## 2013-08-05 DIAGNOSIS — M545 Low back pain, unspecified: Secondary | ICD-10-CM | POA: Diagnosis not present

## 2013-08-05 DIAGNOSIS — M25559 Pain in unspecified hip: Secondary | ICD-10-CM | POA: Diagnosis not present

## 2013-08-07 DIAGNOSIS — H40009 Preglaucoma, unspecified, unspecified eye: Secondary | ICD-10-CM | POA: Diagnosis not present

## 2013-08-13 ENCOUNTER — Telehealth: Payer: Self-pay | Admitting: Neurology

## 2013-08-13 NOTE — Telephone Encounter (Signed)
Pt calls because he will have been off of Robaxin for only 1 week in preparation for the test instead of 2 weeks.  MSLT is scheduled next week and he did not start weaning in enough time.  He wants to know if this is okay and also to let you know that he has been taking Ambien every night.

## 2013-08-13 NOTE — Telephone Encounter (Signed)
Okay to be off of Robaxin x 1 week and to be taking Ambien at night.

## 2013-08-27 ENCOUNTER — Encounter: Payer: Self-pay | Admitting: Neurology

## 2013-08-27 ENCOUNTER — Ambulatory Visit (INDEPENDENT_AMBULATORY_CARE_PROVIDER_SITE_OTHER): Payer: Medicare Other | Admitting: Neurology

## 2013-08-27 DIAGNOSIS — G4733 Obstructive sleep apnea (adult) (pediatric): Secondary | ICD-10-CM

## 2013-08-27 DIAGNOSIS — G4710 Hypersomnia, unspecified: Secondary | ICD-10-CM

## 2013-08-27 DIAGNOSIS — Z9989 Dependence on other enabling machines and devices: Secondary | ICD-10-CM

## 2013-08-27 DIAGNOSIS — G4730 Sleep apnea, unspecified: Principal | ICD-10-CM

## 2013-08-27 DIAGNOSIS — R400 Somnolence: Secondary | ICD-10-CM

## 2013-08-27 DIAGNOSIS — G4719 Other hypersomnia: Secondary | ICD-10-CM

## 2013-08-28 DIAGNOSIS — G4733 Obstructive sleep apnea (adult) (pediatric): Secondary | ICD-10-CM | POA: Diagnosis not present

## 2013-08-28 DIAGNOSIS — G4710 Hypersomnia, unspecified: Secondary | ICD-10-CM | POA: Diagnosis not present

## 2013-09-11 ENCOUNTER — Telehealth: Payer: Self-pay | Admitting: Neurology

## 2013-09-11 DIAGNOSIS — G4733 Obstructive sleep apnea (adult) (pediatric): Secondary | ICD-10-CM

## 2013-09-11 DIAGNOSIS — Z9989 Dependence on other enabling machines and devices: Secondary | ICD-10-CM

## 2013-09-11 NOTE — Telephone Encounter (Signed)
Please advise the patient that his CPAP titration study followed by his nap study did not show any significant sleepiness disorder such as narcolepsy. We did adjust his mask and his CPAP pressures some and I will order those changes. I will see him in followup in about 3 months. Please arrange appointment and fax copy to PCP as well.

## 2013-09-14 NOTE — Telephone Encounter (Signed)
Called the patient and discussed sleep study results.  He understands that MSLT did not show significant sleepiness disorder such as narcolepsy.  He is aware that order has been submitted to [facility] for pressure increase and chin strap.  He will call the office and arrange a follow up appointment for 3 months.  He will receive his sleep study report in the mail.

## 2013-10-28 ENCOUNTER — Other Ambulatory Visit: Payer: Self-pay | Admitting: Family Medicine

## 2013-10-28 ENCOUNTER — Ambulatory Visit (INDEPENDENT_AMBULATORY_CARE_PROVIDER_SITE_OTHER): Payer: Medicare Other | Admitting: Family Medicine

## 2013-10-28 ENCOUNTER — Encounter: Payer: Self-pay | Admitting: Family Medicine

## 2013-10-28 VITALS — BP 134/80 | HR 62 | Temp 98.6°F | Wt 220.5 lb

## 2013-10-28 DIAGNOSIS — J069 Acute upper respiratory infection, unspecified: Secondary | ICD-10-CM

## 2013-10-28 DIAGNOSIS — B9789 Other viral agents as the cause of diseases classified elsewhere: Principal | ICD-10-CM

## 2013-10-28 MED ORDER — HYDROCOD POLST-CHLORPHEN POLST 10-8 MG/5ML PO LQCR: 5.0000 mL | Freq: Every evening | ORAL | Status: DC | PRN

## 2013-10-28 NOTE — Progress Notes (Signed)
Pre visit review using our clinic review tool, if applicable. No additional management support is needed unless otherwise documented below in the visit note. 

## 2013-10-28 NOTE — Assessment & Plan Note (Signed)
URI with laryngitis. Anticipate viral process given short duration. Treat with supportive care and hydrocodone cough syrup for night time.

## 2013-10-28 NOTE — Patient Instructions (Addendum)
Sounds like you have a viral upper respiratory infection. Antibiotics are not needed for this.  Viral infections usually take 7-10 days to resolve.  The cough can last a few weeks to go away. Use medication as prescribed: cough syrup at night time. Take ibuprofen 400mg  twice daily with food for inflammation. Push fluids and plenty of rest. Let us know if fever >101 or worsening productive cough. Call clinic with questions.  Good to see you today.

## 2013-10-28 NOTE — Telephone Encounter (Signed)
Last filled 07/10/13

## 2013-10-28 NOTE — Progress Notes (Signed)
BP 134/80  Pulse 62  Temp(Src) 98.6 F (37 C) (Oral)  Wt 220 lb 8 oz (100.018 kg)  SpO2 97%   CC: cough  Subjective:    Patient ID: [name], male    DOB: 10-31-1945, 68 y.o.   MRN: [id number]  HPI: KINGSON LOHMEYER is a 68 y.o. male presenting on 10/28/2013 for Cough   5d h/o dry throat that progressed to dry hacking cough, head congestion with mild pressure headache, stuffy ears and hoarse voice. Self treating with tussin DM OTC which as helped control cough but still coughing at night time that wakes him up.   No fevers/chills, ear or tooth pain, abd pain, nausea, ST.  No smokers at home. No h/o asthma. No sick contacts at home.  Relevant past medical, surgical, family and social history reviewed and updated as indicated.  Allergies and medications reviewed and updated. Current Outpatient Prescriptions on File Prior to Visit  Medication Sig  . methocarbamol (ROBAXIN) 750 MG tablet Take 1 tablet (750 mg total) by mouth 3 (three) times daily as needed.  . modafinil (PROVIGIL) 100 MG tablet Take 1 tablet (100 mg total) by mouth 2 (two) times daily.  . nortriptyline (PAMELOR) 25 MG capsule TAKE 1 CAPSULE AT BEDTIME  . nystatin (MYCOSTATIN) 100000 UNIT/ML suspension Take 5 mLs (500,000 Units total) by mouth 4 (four) times daily as needed.  Marland Kitchen omeprazole (PRILOSEC) 20 MG capsule TAKE 1 CAPSULE DAILY  . tamsulosin (FLOMAX) 0.4 MG CAPS capsule 1 capsule daily.  Marland Kitchen zolpidem (AMBIEN) 10 MG tablet Take 0.5 tablets (5 mg total) by mouth at bedtime as needed for sleep.   No current facility-administered medications on file prior to visit.    Review of Systems Per HPI unless specifically indicated above    Objective:    BP 134/80  Pulse 62  Temp(Src) 98.6 F (37 C) (Oral)  Wt 220 lb 8 oz (100.018 kg)  SpO2 97%  Physical Exam  Nursing note and vitals reviewed. Constitutional: He appears well-developed and well-nourished. No distress.  HENT:  Head: Normocephalic and  atraumatic.  Right Ear: Hearing, external ear and ear canal normal.  Left Ear: Hearing, tympanic membrane, external ear and ear canal normal.  Nose: Mucosal edema present. No rhinorrhea. Right sinus exhibits no maxillary sinus tenderness and no frontal sinus tenderness. Left sinus exhibits no maxillary sinus tenderness and no frontal sinus tenderness.  Mouth/Throat: Uvula is midline, oropharynx is clear and moist and mucous membranes are normal. No oropharyngeal exudate, posterior oropharyngeal edema, posterior oropharyngeal erythema or tonsillar abscesses.  R TM covered by cerumen  Eyes: Conjunctivae and EOM are normal. Pupils are equal, round, and reactive to light. No scleral icterus.  Neck: Normal range of motion. Neck supple. No thyromegaly present.  Cardiovascular: Normal rate, regular rhythm, normal heart sounds and intact distal pulses.   No murmur heard. Pulmonary/Chest: Effort normal and breath sounds normal. No respiratory distress. He has no wheezes. He has no rales.  Lymphadenopathy:    He has no cervical adenopathy.  Skin: Skin is warm and dry. No rash noted.       Assessment & Plan:   Problem List Items Addressed This Visit   Viral URI with cough - Primary     URI with laryngitis. Anticipate viral process given short duration. Treat with supportive care and hydrocodone cough syrup for night time.        Follow up plan: Return if symptoms worsen or fail to improve.

## 2013-11-11 ENCOUNTER — Ambulatory Visit (INDEPENDENT_AMBULATORY_CARE_PROVIDER_SITE_OTHER): Payer: Medicare Other

## 2013-11-11 DIAGNOSIS — Z23 Encounter for immunization: Secondary | ICD-10-CM

## 2013-11-27 DIAGNOSIS — M545 Low back pain: Secondary | ICD-10-CM | POA: Diagnosis not present

## 2013-11-27 DIAGNOSIS — M5126 Other intervertebral disc displacement, lumbar region: Secondary | ICD-10-CM | POA: Diagnosis not present

## 2013-11-27 DIAGNOSIS — M25551 Pain in right hip: Secondary | ICD-10-CM | POA: Diagnosis not present

## 2013-12-07 ENCOUNTER — Encounter: Payer: Self-pay | Admitting: Family Medicine

## 2013-12-18 ENCOUNTER — Ambulatory Visit (INDEPENDENT_AMBULATORY_CARE_PROVIDER_SITE_OTHER): Payer: Medicare Other | Admitting: Neurology

## 2013-12-18 ENCOUNTER — Encounter: Payer: Self-pay | Admitting: Neurology

## 2013-12-18 VITALS — BP 135/75 | HR 64 | Temp 97.5°F | Ht 67.0 in | Wt 221.0 lb

## 2013-12-18 DIAGNOSIS — M5489 Other dorsalgia: Secondary | ICD-10-CM | POA: Diagnosis not present

## 2013-12-18 DIAGNOSIS — G4719 Other hypersomnia: Secondary | ICD-10-CM

## 2013-12-18 DIAGNOSIS — Z9989 Dependence on other enabling machines and devices: Secondary | ICD-10-CM

## 2013-12-18 DIAGNOSIS — G4733 Obstructive sleep apnea (adult) (pediatric): Secondary | ICD-10-CM | POA: Diagnosis not present

## 2013-12-18 DIAGNOSIS — Z9889 Other specified postprocedural states: Secondary | ICD-10-CM

## 2013-12-18 NOTE — Patient Instructions (Signed)
Please continue using your CPAP regularly. While your insurance requires that you use CPAP at least 4 hours each night on 70% of the nights, I recommend, that you not skip any nights and use it throughout the night if you can. Getting used to CPAP and staying with the treatment long term does take time and patience and discipline. Untreated obstructive sleep apnea when it is moderate to severe can have an adverse impact on cardiovascular health and raise her risk for heart disease, arrhythmias, hypertension, congestive heart failure, stroke and diabetes. Untreated obstructive sleep apnea causes sleep disruption, nonrestorative sleep, and sleep deprivation. This can have an impact on your day to day functioning and cause daytime sleepiness and impairment of cognitive function, memory loss, mood disturbance, and problems focussing. Using CPAP regularly can improve these symptoms.  Please remember to try to maintain good sleep hygiene, which means: Keep a regular sleep and wake schedule, allow for 7 1/2 to 8 1/2 hours or sleep, try not to exercise or have a meal within 2 hours of your bedtime, try to keep your bedroom conducive for sleep, that is, cool and dark, without light distractors such as an illuminated alarm clock, and refrain from watching TV right before sleep or in the middle of the night and do not keep the TV or radio on during the night. Also, try not to use or play on electronic devices at bedtime, such as your cell phone, tablet PC or laptop. If you like to read at bedtime on an electronic device, try to dim the background light as much as possible. Do not eat in the middle of the night.    You can try Melatonin at night for sleep: take 3 to 5 mg one to 2 hours before your bedtime.   Drink more water.   We will continue with Provigil 100 mg twice daily, morning and midday or early afternoon, before 3 PM for the second dose.

## 2013-12-18 NOTE — Progress Notes (Signed)
Subjective:    Patient ID: Gabriel [name] is a 68 y.o. male.  HPI     Interim history:   [name] is a very friendly 68 year old left-handed woman with an underlying medical history of arthritis of the left shoulder, degenerative disc disease, cervical radiculopathy, depression with anxiety, reflux disease, and a prior diagnosis of obstructive sleep apnea, who presents for follow-up consultation of his OSA with significant residual EDS. He is unaccompanied today. I last saw him on 07/02/2013, at which time I suggested he return for a full night CPAP study followed by a nap study the next day. He was advised to taper off his nortriptyline and Robaxin as well as stop his Provigil 7-10 days prior to sleep testing. He had a CPAP study on 08/27/2013. Sleep efficiency was 86.9%. Latency to sleep was 3.5 minutes. Wake after sleep onset was 55.5 minutes with moderate to severe sleep fragmentation noted. He had an elevated arousal index at 15.2 per hour. He had an increased percentage of stage 1 sleep and a mildly increased percentage of REM sleep at 26.1% with a mildly prolonged REM latency of 130.5 minutes. He had frequent PVCs. He was on CPAP at the usual pressure of 10 cm which was then increased to 11 and 12 cm. I felt he did a little bit better on 11 cm and since it was not a big change from his baseline treatment pressure we proceeded with the next day nap study. He had an MSLT on 08/28/2013 with 5. Mean sleep latency was 10.4 with no REM onset naps. We called him with his test results on the phone. I did make an adjustment to his CPAP pressure to 11 cm.  Today, I reviewed his compliance data from 11/16/2013 through 12/15/2013 which is a total of 30 days during which time he used his CPAP every night with percent used days greater than 4 hours of 97%, indicating excellent compliance, average usage of 6 hours and 17 minutes. Residual AHI at 1.2 per hour, leak at times was high with the 95th percentile at  47.3 L/m. Pressure at 11 cm with EPR of 2.  Today, he reports having trouble sleeping at night. He feels sleepy during the day. He has been taking Provigil once daily, 100 mg. One time he took it 3 times a day and took the last pill at 7 PM and could not sleep tonight. We had talked about this before. I reiterated my instructions from last visit to him today. He has had more stress. He has more back pain. He is worried about his wife's health. He did not restart the nortriptyline. We talked about his most recent sleep testing in July in detail today. He does not necessarily keep a sleep wake schedule.  I saw him on 01/14/13, at which time we talked about his sleep study results and his CPAP  compliance which was good. He was well treated in terms of reduction of his residual AHI. I suggested he try Nuvigil as he had some side effects in the past with Provigil. We also talked about potentially bringing him back for further testing with MSLT down the [place]. In the interim, he was seen by our NP, Ms. Lam, on 04/17/2013, at which time he reported that he could not afford Nuvigil and we decided to prescribe Provigil again. He requested to try it again. We also scheduled him for a repeat sleep study followed by a nap study to further delineate and quantify his sleepiness.  I asked him to taper off sedating medications with the help of his primary care physician and also discontinue his Provigil 7-10 days prior to the sleep studies. He was given instructions by his primary care physician to come off of his muscle relaxer and nortriptyline. He did not have his sleep studies done and called to cancel those. I reviewed his CPAP compliance down from 03/18/2013 through 04/16/2013 which is a total of 30 days during which time he uses CPAP every night with 80% used days greater than 4 hours at 100%, indicating superb compliance with an average usage of 7 hours. Residual AHI was acceptable at 2.7 per hour and leak was acceptable  at 20.1 L per minute at the 95th percentile.  Today, I reviewed his compliance data from 06/02/2013 through 07/01/2013 which is the last 30 days during which time he was 100% compliant. Average usage was 6 hours and 39 minutes. Residual AHI low at 1 per hour, leak was a time high with the 95th percentile at 34.7 L per minute, pressure at 10 cm with EPR of 2.   Today, he reports, that he was in [place] during the time of his scheduled sleep testing and did not call back to reschedule. He is now taking nortriptyline 25 mg qHS prn, about 3 nights a week and he takes Ambien 5 mg each night. He has been on modafinil 100 mg bid and is wondering, if he could take more. He has had more LBP. He has R shoulder pain. His wife is in rehab and he is currently home alone and has had some bad dreams. He is now on OTC B12 supplementation, per PCP. He feels about 50% better with the modafinil and has noted no SEs. His mother is in [place].   I first met him on 10/16/12, at which time, he reported problems initiating and maintaining sleep and ongoing severe EDS. He has been taking Pamelor at bedtime (previously Elavil, but had to stop because of severe mouth dryness) as well as Ambien 5 mg at bedtime and has been on it for about 25 years. I recommended re-evaluation of his OSA first with a split night sleep study. He has this test on 11/21/2012 and I went over his sleep study report in detail with him previously: His baseline sleep efficiency was reduced at 60.5% with a latency to sleep of 14 minutes and wake after sleep onset of 36 minutes with moderate to severe sleep fragmentation noted. He had a markedly elevated arousal index at 96.5 per hour. There was a markedly increased percentage of stage I sleep, reduced percentage of stage II sleep, and absence of slow-wave sleep and REM sleep prior to CPAP. He had periodic leg movements of 5.5 per hour. He had frequent, unifocal-appearing PVCs. He had mild to moderate snoring. His AHI was  highly elevated at 83.9 per hour. His baseline oxygen saturation was 93%, his nadir was 84% in non-REM sleep. He was then titrated on CPAP. His sleep efficiency was significantly increased and his arousal index improved. He had absence of slow wave sleep and 12.7% of REM sleep. His average oxygen saturation was 93%, his nadir was 82%. He was titrated from 5-11 cm of water pressure and his AHI was reduced to 0 events per hour at the pressure of 10. I also reviewed his compliance data from his machine: From 12/12/2012 through 01/13/2013 which is a total of 33 days during which she used CPAP every day. His average usage was 6  hours and 44 minutes. His residual AHI was 1.7 per hour on the pressure of 10 cm with EPR of 2. He had a fairly high leak at times however.   To today, he reports feeling better than before the sleep study. He is compliant with treatment, likes the machine better and the mask as well. She feels, his sleepiness is about the same, but he feels better. He may take a nap without CPAP. He does not keep a very good sleep schedule. He uses the bathroom about once per night.   He was previously diagnosed with OSA in 1998 and was prescribed CPAP of 12 cm it indicated full compliance. I reviewed his CPAP titration study from 01/24/2010 last time. His AHI was reduced to 0 per hour and O2 nadir was 91% for the night. REM latency of 49 minutes, but this was a titration study, and could have shown REM rebound.   He was tried on Provigil years ago, and it did not help and he felt nervous and irritable on it. He denies cataplexy, or hypogogic or hypnopompic hallucinations, but has had sleep paralysis. He had an MSLT about 10 years ago, which showed no evidence of narcolepsy per patient.   His Past Medical History Is Significant For: Past Medical History  Diagnosis Date  . Seronegative arthritis     RA, previously on MTX, RF <7, ANA nl (rheum Dr. Donnamarie Poag)  . Hematuria - cause not known     s/p urology  workup 2010, nl CT, cystoscopy (doesn't want rpt), cytology  . History of chicken pox   . Depression with anxiety     h/o remote hospitalization for depression  . GERD (gastroesophageal reflux disease)   . History of colon polyps 2005    tubular adenoma, on rpt no more (2010)  . OSA on CPAP     cpap machine at 10cm, ambien, daytime drowsiness attributed to OSA, last changed dextroamphetamine to nuvigil, previously tried provigil  . Torn rotator cuff     bilateral, s/p L repair  . Chronic lower back pain     DDD, prior on lyrica, gabapentin, lumbar stenosis with persistent numbness R ant thigh, ESI didn't help, now seeing [facility]  . BPH (benign prostatic hypertrophy)     h/o prostate nodule, followed by Dr. Risa Grill yearly  . Insomnia     D[place]meier  . Cervical neck pain with evidence of disc disease     h/o bulging discs  . Prostate nodule     His Past Surgical History Is Significant For: Past Surgical History  Procedure Laterality Date  . Achilles tendon repair  2002  . Cataract extraction  2007    left  . Rotator cuff repair  2008    Left (Dr. Gaylyn Rong in [place], now [place])  . Lumbar disc surgery  07/2010    L3/4 diskectomy ([facility]. Harl Bowie at [facility])  . Colonoscopy  11/2008    diverticulosis, rpt 5 yrs  . Mri thoracic  09/2010    thoracic DDD/kyphosis, minimal anterolisthesis T2/3 with B foraminal narrowing  . Upper gastrointestinal endoscopy  02/2005    minimal GERD, small gastric polyp, tiny HH  . Dobutamine stress echo  08/2006    negative for ischemia    His Family History Is Significant For: Family History  Problem Relation Age of Onset  . Arthritis Mother   . COPD Father     smoker  . Cancer Neg Hx   . Stroke Neg Hx   . Coronary  artery disease Neg Hx   . Diabetes Neg Hx     His Social History Is Significant For: History   Social History  . Marital Status: Married    Spouse Name: [name]    Number of Children: 2  . Years of Education: Masters +   Occupational  History  . Clergyman Other    [employer]   Social History Main Topics  . Smoking status: Never Smoker   . Smokeless tobacco: Never Used  . Alcohol Use: No  . Drug Use: No  . Sexual Activity: None   Other Topics Concern  . None   Social History Narrative   Caffeine: avoids   Patient is married [name]) Lives with wife - who is in a power chair.   Occupation: Scientist, product/process development, Oceanographer   Edu: masters +   Likes to travel   Activity: no regular activity, to join Y   Diet: some fruits/vegetables   Patient has two children.   Patient is left-handed.    His Allergies Are:  Allergies  Allergen Reactions  . Amoxicillin Nausea And Vomiting  . Erythromycin Nausea And Vomiting  . Sulfa Drugs Cross Reactors Other (See Comments)    Thrush  :   His Current Medications Are:  Outpatient Encounter Prescriptions as of 12/18/2013  Medication Sig  . buPROPion (WELLBUTRIN XL) 300 MG 24 hr tablet TAKE 1 TABLET DAILY  . MELOXICAM PO Take by mouth as needed.  . methocarbamol (ROBAXIN) 750 MG tablet Take 1 tablet (750 mg total) by mouth 3 (three) times daily as needed.  . modafinil (PROVIGIL) 100 MG tablet Take 1 tablet (100 mg total) by mouth 2 (two) times daily.  Marland Kitchen nystatin (MYCOSTATIN) 100000 UNIT/ML suspension Take 5 mLs (500,000 Units total) by mouth 4 (four) times daily as needed.  Marland Kitchen omeprazole (PRILOSEC) 20 MG capsule TAKE 1 CAPSULE DAILY  . tamsulosin (FLOMAX) 0.4 MG CAPS capsule 1 capsule daily.  Marland Kitchen zolpidem (AMBIEN) 10 MG tablet Take 0.5 tablets (5 mg total) by mouth at bedtime as needed for sleep.  . [DISCONTINUED] chlorpheniramine-HYDROcodone (TUSSIONEX) 10-8 MG/5ML LQCR Take 5 mLs by mouth at bedtime as needed for cough. Sedation precautions  . [DISCONTINUED] nortriptyline (PAMELOR) 25 MG capsule TAKE 1 CAPSULE AT BEDTIME  :  Review of Systems:  Out of a complete 14 point review of systems, all are reviewed and negative with the exception of these symptoms as  listed below:   Review of Systems  Respiratory: Positive for cough and shortness of breath.   Genitourinary: Positive for urgency.       Frequency of urination  Neurological:       Insomnia,daytime sleepiness, apnea  Psychiatric/Behavioral: The patient is nervous/anxious.        Depression    Objective:  Neurologic Exam  Physical Exam Physical Examination:   Filed Vitals:   12/18/13 1112  BP: 135/75  Pulse: 64  Temp: 97.5 F (36.4 C)   General Examination: The patient is a very pleasant 68 y.o. male in no acute distress. He appears well-developed and well-nourished and very well groomed. He is in good spirits.   HEENT: Normocephalic, atraumatic, pupils are equal, round and reactive to light and accommodation. Funduscopic exam is normal with sharp disc margins noted. Extraocular tracking is good without limitation to gaze excursion or nystagmus noted. Normal smooth pursuit is noted. Hearing is grossly intact. Face is symmetric with normal facial animation and normal facial sensation. Speech is clear with no dysarthria noted. There  is no hypophonia. There is no lip, neck/head, jaw or voice tremor. Neck is supple with full range of passive and active motion. There are no carotid bruits on auscultation. Oropharynx exam reveals: mild mouth dryness, adequate dental hygiene and moderate airway crowding, due to narrow airway entry and larger tongue. Mallampati is class III. Tongue protrudes centrally and palate elevates symmetrically. Tonsils are 1+.    Chest: Clear to auscultation without wheezing, rhonchi or crackles noted.  Heart: S1+S2+0, regular and normal without murmurs, rubs or gallops noted.   Abdomen: Soft, non-tender and non-distended with normal bowel sounds appreciated on auscultation.  Extremities: There is no pitting edema in the distal lower extremities bilaterally. Pedal pulses are intact.  Skin: Warm and dry without trophic changes noted. There are mild varicose veins  in the right distal leg.  Musculoskeletal: exam reveals no obvious joint deformities, tenderness or joint swelling or erythema.   Neurologically:  Mental status: The patient is awake, alert and oriented in all 4 spheres. His memory, attention, language and knowledge are appropriate. There is no aphasia, agnosia, apraxia or anomia. Speech is clear with normal prosody and enunciation. Thought process is linear. Mood is congruent and affect is normal.  Cranial nerves are as described above under HEENT exam. In addition, shoulder shrug is normal with equal shoulder height noted. Motor exam: Normal bulk, strength and tone is noted. There is no drift, tremor or rebound. Romberg is negative. Reflexes are 2+ throughout. Toes are downgoing bilaterally. Fine motor skills are intact with normal finger taps, normal hand movements, normal rapid alternating patting, normal foot taps and normal foot agility.  Cerebellar testing shows no dysmetria or intention tremor on finger to nose testing. Heel to shin is unremarkable bilaterally. There is no truncal or gait ataxia.  Sensory exam is intact to light touch, pinprick, vibration, temperature sense in the upper and lower extremities.  Gait, station and balance are unremarkable. No veering to one side is noted. No leaning to one side is noted. Posture is age-appropriate and stance is narrow based. No problems turning are noted. He turns en bloc. Tandem walk is unremarkable.         Assessment and Plan:   In summary, Gabriel [name] is a very pleasant 68 year old male with an underlying medical history of arthritis of the left shoulder, degenerative disc disease, cervical radiculopathy, depression with anxiety, and reflux disease, who presents for follow up consultation of his obstructive sleep apnea with significant residual EDS. He is on CPAP with excellent compliance and has always been fully compliant with treatment. Provigil has helped his daytime somnolence and he  is typically taking 100 mg once daily. He reports problems sleeping at night. We talked about his anxiety and worsening depression and stress quite a bit today. He also does not keep a very good sleep awake schedule. We talked about sleep hygiene as well. I explained his most recent sleep study results to him which included a CPAP study on 08/27/2013 followed by a nap study the next day on 08/28/2013 which did not indicate any significant residual daytime somnolence. I suggested he continue with Provigil but he can take 100 mg twice daily, with the second dose no later than 3 PM to avoid nighttime insomnia. He is advised to be better with his sleep schedule. He can try melatonin at night, 3-5 mg about an hour or 2 before his projected bedtime. He is again advised not to drive when sleepy and he in encouraged to  drink more water. He is advised to talk to his primary care physician about his residual mood related issues including increased stress recently. He is commended for being compliant with CPAP therapy. I would like to see him back in 6 months from now, sooner if the need arises and encouraged him to call with any interim questions or concerns. I answered all his questions today and the patient was in agreement.

## 2013-12-23 ENCOUNTER — Encounter: Payer: Self-pay | Admitting: Neurology

## 2013-12-25 ENCOUNTER — Telehealth: Payer: Self-pay

## 2013-12-25 NOTE — Telephone Encounter (Signed)
Pt talked with CAN last night; CAN triage report is in Dr Gutierrez's in box. [place] lunch [place] a church on 12[place]24[place]13 and had vomiting and diarrhea q 2 hrs until early this AM. Pt had diarrhea x 10 and vomiting x 6. Pt said vomiting and diarrhea have subsided but pt wants to know what he can eat. Advised pt as from CAN nurse could start with small portions of bland type food such as crackers, toast,rice, cooked cereal, baked potato without the butter. Pt wanted to hear from Dr Danise Mina what he would suggest for pt to eat. Pt request cb ASAP.

## 2013-12-25 NOTE — Telephone Encounter (Signed)
Noted. Recommend clear liquid diet for next 24 hours (chicken broth, jello), then can slowly advance diet if tolerated well (as per below). Let us know if not improving as expected

## 2013-12-25 NOTE — Telephone Encounter (Signed)
Patient notified and verbalized understanding. 

## 2013-12-27 ENCOUNTER — Other Ambulatory Visit: Payer: Self-pay | Admitting: *Deleted

## 2013-12-27 MED ORDER — OMEPRAZOLE 20 MG PO CPDR: 20.0000 mg | DELAYED_RELEASE_CAPSULE | Freq: Every day | ORAL | Status: DC

## 2014-01-10 ENCOUNTER — Other Ambulatory Visit: Payer: Self-pay | Admitting: Family Medicine

## 2014-01-10 ENCOUNTER — Other Ambulatory Visit: Payer: Self-pay

## 2014-01-10 MED ORDER — METHOCARBAMOL 750 MG PO TABS: 750.0000 mg | ORAL_TABLET | Freq: Three times a day (TID) | ORAL | Status: DC | PRN

## 2014-01-10 MED ORDER — ZOLPIDEM TARTRATE 10 MG PO TABS: 5.0000 mg | ORAL_TABLET | Freq: Every evening | ORAL | Status: DC | PRN

## 2014-01-10 NOTE — Telephone Encounter (Signed)
Printed and in [name]'s box 

## 2014-01-10 NOTE — Telephone Encounter (Signed)
Rxs faxed to [facility].

## 2014-01-10 NOTE — Telephone Encounter (Signed)
Pt left v/m requesting refill methocarbamol and [facility]pidem [facility];Please advise.

## 2014-01-13 ENCOUNTER — Other Ambulatory Visit: Payer: Self-pay | Admitting: *Deleted

## 2014-01-13 NOTE — Telephone Encounter (Signed)
Ok to refill? (mail order)

## 2014-01-14 MED ORDER — ZOLPIDEM TARTRATE 10 MG PO TABS: 5.0000 mg | ORAL_TABLET | Freq: Every evening | ORAL | Status: DC | PRN

## 2014-01-14 NOTE — Telephone Encounter (Signed)
wasn't this done last week? Reprinted copy.

## 2014-01-14 NOTE — Telephone Encounter (Addendum)
Yes it was and faxed along with robaxin script. Will resend.

## 2014-01-27 ENCOUNTER — Ambulatory Visit (INDEPENDENT_AMBULATORY_CARE_PROVIDER_SITE_OTHER): Payer: Medicare Other | Admitting: Family Medicine

## 2014-01-27 ENCOUNTER — Encounter: Payer: Self-pay | Admitting: Family Medicine

## 2014-01-27 VITALS — BP 146/72 | HR 74 | Temp 98.6°F | Wt 220.8 lb

## 2014-01-27 DIAGNOSIS — G8929 Other chronic pain: Secondary | ICD-10-CM | POA: Diagnosis not present

## 2014-01-27 DIAGNOSIS — M545 Low back pain, unspecified: Secondary | ICD-10-CM

## 2014-01-27 NOTE — Progress Notes (Signed)
Pre visit review using our clinic review tool, if applicable. No additional management support is needed unless otherwise documented below in the visit note.  Frequently bending and lifting caring for his wife.   Gabriel back pain at baseline. Last seen by Dr. Harl Bowie at [facility] 11/27/13.  MRI report noted, w/o acute changes.   Now with a new pain over the last few weeks.  R side of back, upper L spine, lower T spine area.  Not a midline pain, no L sided sx. No urinary sx. No fevers.  No rash.  Normal BMs.  Has tried meloxicam but w/o a lot of relief.   Had been on robaxin prev, was off it for a few weeks, he just got started back on that in the last few days.   Still down is the worst pain, less pain standing.   No radicular sx. Pushing on the sore area helps the pain.  Hasn't tired heat yet.   More pain with forward flexion than back extension.  Pain twisting.    Meds, vitals, and allergies reviewed.   ROS: See HPI.  Otherwise, noncontributory.  nad ncat Mmm Neck supple rrr ctab Midline back not ttp R paraspinal muscles ttp with spasms noted but no cva pain No rash

## 2014-01-27 NOTE — Patient Instructions (Signed)
Get back on the methocarbamol, use a heating pad, gently stretch.

## 2014-01-28 NOTE — Assessment & Plan Note (Signed)
Looks to be a separate issue, with muscle spasms.  Had been on robaxin for a while, just restarted.  Continue than, use heat, stretch, f/u prn.   No need for repeat imaging.  He agrees with plan.

## 2014-04-08 ENCOUNTER — Ambulatory Visit (INDEPENDENT_AMBULATORY_CARE_PROVIDER_SITE_OTHER): Payer: Medicare Other | Admitting: Family Medicine

## 2014-04-08 ENCOUNTER — Encounter: Payer: Self-pay | Admitting: Family Medicine

## 2014-04-08 VITALS — BP 128/86 | HR 68 | Temp 98.3°F | Wt 221.5 lb

## 2014-04-08 DIAGNOSIS — K635 Polyp of colon: Secondary | ICD-10-CM

## 2014-04-08 DIAGNOSIS — M25512 Pain in left shoulder: Secondary | ICD-10-CM | POA: Diagnosis not present

## 2014-04-08 MED ORDER — MELOXICAM 7.5 MG PO TABS: 7.5000 mg | ORAL_TABLET | Freq: Every day | ORAL | Status: DC

## 2014-04-08 NOTE — Patient Instructions (Signed)
I am suspicious for rotator cuff injury on left - treat with continued methocarbamol and restart meloxicam 7.5mg  daily along with heating pad +/- ice.  Stretching exercises provided today as well as resistance band. Try this for next 2 weeks and update me with effect - if no better we will refer you back to orthopedist Mardelle Matte).  We will refer you for colonoscopy.

## 2014-04-08 NOTE — Progress Notes (Signed)
BP 128/86 mmHg  Pulse 68  Temp(Src) 98.3 F (36.8 C) (Oral)  Wt 221 lb 8 oz (100.472 kg)   CC: L arm pain  Subjective:    Patient ID: Gabriel [name], male    DOB: 10-Aug-1945, 69 y.o.   MRN: [id number]  HPI: Gabriel [name] is a 69 y.o. male presenting on 04/08/2014 for Arm Pain   Recent trip to [place] and [place] with travel agency. On return started having bilateral arm pain. Was carrying heavy baggage and prolonged walking. Seems worse after heating pad and sports cream OTC analgesic rub. No current neck pain or shooting pain down arms. Denies numbness or weakness of arms.  Regularly takes methocarbamol twice daily.  12/2013 had GI bug.  Requests referral for colonoscopy.  Known h/o bilateral torn rotatur cuffs s/p L repair. Patient also with seronegative arthritis previously on MTX. He also has history of cervical neck pain in h/o bulging discs.   Lab Results  Component Value Date   CREATININE 1.01 04/12/2013   Relevant past medical, surgical, family and social history reviewed and updated as indicated. Interim medical history since our last visit reviewed. Allergies and medications reviewed and updated. Current Outpatient Prescriptions on File Prior to Visit  Medication Sig  . buPROPion (WELLBUTRIN XL) 300 MG 24 hr tablet TAKE 1 TABLET DAILY  . methocarbamol (ROBAXIN) 750 MG tablet Take 1 tablet (750 mg total) by mouth 3 (three) times daily as needed.  . modafinil (PROVIGIL) 100 MG tablet Take 1 tablet (100 mg total) by mouth 2 (two) times daily.  Marland Kitchen nystatin (MYCOSTATIN) 100000 UNIT/ML suspension Take 5 mLs (500,000 Units total) by mouth 4 (four) times daily as needed.  Marland Kitchen omeprazole (PRILOSEC) 20 MG capsule Take 1 capsule (20 mg total) by mouth daily.  . tamsulosin (FLOMAX) 0.4 MG CAPS capsule 1 capsule daily.  Marland Kitchen zolpidem (AMBIEN) 10 MG tablet Take 0.5 tablets (5 mg total) by mouth at bedtime as needed for sleep.   No current facility-administered medications on file prior  to visit.   Past Medical History  Diagnosis Date  . Seronegative arthritis     RA, previously on MTX, RF <7, ANA nl (rheum Dr. Donnamarie Poag)  . Hematuria - cause not known     s/p urology workup 2010, nl CT, cystoscopy (doesn't want rpt), cytology  . History of chicken pox   . Depression with anxiety     h/o remote hospitalization for depression  . GERD (gastroesophageal reflux disease)   . History of colon polyps 2005    tubular adenoma, on rpt no more (2010)  . OSA on CPAP     cpap machine at 10cm, ambien, daytime drowsiness attributed to OSA, last changed dextroamphetamine to nuvigil, previously tried provigil  . Torn rotator cuff     bilateral, s/p L repair  . Chronic lower back pain     DDD, prior on lyrica, gabapentin, lumbar stenosis with persistent numbness R ant thigh, ESI didn't help, now seeing Branch  . BPH (benign prostatic hypertrophy)     h/o prostate nodule, followed by Dr. Risa Grill yearly  . Insomnia     Dohmeier  . Cervical neck pain with evidence of disc disease     h/o bulging discs  . Prostate nodule     Review of Systems Per HPI unless specifically indicated above     Objective:    BP 128/86 mmHg  Pulse 68  Temp(Src) 98.3 F (36.8 C) (Oral)  Wt 221 lb 8  oz (100.472 kg)  Wt Readings from Last 3 Encounters:  04/08/14 221 lb 8 oz (100.472 kg)  01/27/14 220 lb 12 oz (100.132 kg)  12/18/13 221 lb (100.245 kg)    Physical Exam  Constitutional: He appears well-developed and well-nourished. No distress.  Musculoskeletal: He exhibits no edema.  No midline spine tenderness or paraspinous mm tenderness  R shoulder WNL L shoulder exam No deformity of shoulders on inspection. No pain with palpation of shoulder landmarks. FROM in abduction and forward flexion. + pain with testing SITS in ext/int rotation. ++ pain with empty can sign. Neg Yerguson, Speed test. No impingement. No pain with crossover test. + pain with rotation of humeral head in [facility] joint.     Nursing note and vitals reviewed.     Assessment & Plan:  Colonoscopy referral placed.  Problem List Items Addressed This Visit    Left shoulder pain - Primary    Known h/o bilateral supraspinatus tear s/p L repair 2008.  Anticipate recurrent L injury of supraspinatus muscle or other RTC injury. rec conservative treatment for now with restarting mobic 7.5mg  daily and continuing robaxin muscle relaxant, ice/heating pad, and provided with exercises from [facility] pt advisor. Update if no improvement, consider referral to Dr Mardelle Matte again vs steroid shot which he responded to well in past. Pt agrees with plan.      Colon polyp   Relevant Orders   Ambulatory referral to Gastroenterology       Follow up plan: Return if symptoms worsen or fail to improve, for follow up visit.

## 2014-04-08 NOTE — Progress Notes (Signed)
Pre visit review using our clinic review tool, if applicable. No additional management support is needed unless otherwise documented below in the visit note. 

## 2014-04-08 NOTE — Assessment & Plan Note (Signed)
Known h/o bilateral supraspinatus tear s/p L repair 2008.  Anticipate recurrent L injury of supraspinatus muscle or other RTC injury. rec conservative treatment for now with restarting mobic 7.5mg  daily and continuing robaxin muscle relaxant, ice/heating pad, and provided with exercises from [facility] pt advisor. Update if no improvement, consider referral to Dr Mardelle Matte again vs steroid shot which he responded to well in past. Pt agrees with plan.

## 2014-04-11 ENCOUNTER — Telehealth: Payer: Self-pay | Admitting: Family Medicine

## 2014-04-11 NOTE — Telephone Encounter (Signed)
Noted. Ok by me plz fax my note from Tuesday attn Dr Mardelle Matte.

## 2014-04-11 NOTE — Telephone Encounter (Signed)
Patient was seen this week for left arm.  Patient said he's in a lot of pain.  He forgot that he had seen Dr.Landau before and he called Dr.Landau's office and scheduled an appointment on Monday morning.

## 2014-04-14 ENCOUNTER — Other Ambulatory Visit: Payer: Self-pay | Admitting: Orthopedic Surgery

## 2014-04-14 DIAGNOSIS — M25511 Pain in right shoulder: Secondary | ICD-10-CM

## 2014-04-14 DIAGNOSIS — M25512 Pain in left shoulder: Secondary | ICD-10-CM | POA: Diagnosis not present

## 2014-04-14 NOTE — Telephone Encounter (Signed)
progess notes on 04/08/14 were faxed to Dr Gwenlyn Found Faxed to [phone]

## 2014-04-21 ENCOUNTER — Ambulatory Visit
Admission: RE | Admit: 2014-04-21 | Discharge: 2014-04-21 | Disposition: A | Discharge status: Home / Self Care | Payer: Medicare Other | Source: Ambulatory Visit | Attending: Orthopedic Surgery | Admitting: Orthopedic Surgery

## 2014-04-21 DIAGNOSIS — M25512 Pain in left shoulder: Secondary | ICD-10-CM

## 2014-04-21 DIAGNOSIS — S46012A Strain of muscle(s) and tendon(s) of the rotator cuff of left shoulder, initial encounter: Secondary | ICD-10-CM | POA: Diagnosis not present

## 2014-04-21 MED ORDER — IOHEXOL 180 MG/ML  SOLN
15.0000 mL | Freq: Once | INTRAMUSCULAR | Status: AC | PRN
  Administered 2014-04-21: 15 mL via INTRA_ARTICULAR

## 2014-04-24 ENCOUNTER — Encounter: Payer: Self-pay | Admitting: Gastroenterology

## 2014-04-29 ENCOUNTER — Ambulatory Visit
Admission: RE | Admit: 2014-04-29 | Discharge: 2014-04-29 | Disposition: A | Discharge status: Home / Self Care | Payer: Medicare Other | Source: Ambulatory Visit | Attending: Orthopedic Surgery | Admitting: Orthopedic Surgery

## 2014-04-29 DIAGNOSIS — M25511 Pain in right shoulder: Secondary | ICD-10-CM

## 2014-04-29 DIAGNOSIS — M19011 Primary osteoarthritis, right shoulder: Secondary | ICD-10-CM | POA: Diagnosis not present

## 2014-04-29 DIAGNOSIS — M66821 Spontaneous rupture of other tendons, right upper arm: Secondary | ICD-10-CM | POA: Diagnosis not present

## 2014-04-29 DIAGNOSIS — M24111 Other articular cartilage disorders, right shoulder: Secondary | ICD-10-CM | POA: Diagnosis not present

## 2014-04-29 DIAGNOSIS — M75101 Unspecified rotator cuff tear or rupture of right shoulder, not specified as traumatic: Secondary | ICD-10-CM | POA: Diagnosis not present

## 2014-05-02 ENCOUNTER — Other Ambulatory Visit: Payer: Medicare Other

## 2014-05-12 DIAGNOSIS — M25511 Pain in right shoulder: Secondary | ICD-10-CM | POA: Diagnosis not present

## 2014-05-16 ENCOUNTER — Encounter: Payer: Self-pay | Admitting: Family Medicine

## 2014-05-23 DIAGNOSIS — N138 Other obstructive and reflux uropathy: Secondary | ICD-10-CM | POA: Diagnosis not present

## 2014-05-23 DIAGNOSIS — N529 Male erectile dysfunction, unspecified: Secondary | ICD-10-CM | POA: Diagnosis not present

## 2014-05-23 DIAGNOSIS — R312 Other microscopic hematuria: Secondary | ICD-10-CM | POA: Diagnosis not present

## 2014-05-23 DIAGNOSIS — N402 Nodular prostate without lower urinary tract symptoms: Secondary | ICD-10-CM | POA: Diagnosis not present

## 2014-05-23 DIAGNOSIS — N401 Enlarged prostate with lower urinary tract symptoms: Secondary | ICD-10-CM | POA: Diagnosis not present

## 2014-05-30 DIAGNOSIS — M25511 Pain in right shoulder: Secondary | ICD-10-CM | POA: Diagnosis not present

## 2014-06-02 ENCOUNTER — Other Ambulatory Visit: Payer: Self-pay | Admitting: Family Medicine

## 2014-06-04 ENCOUNTER — Other Ambulatory Visit: Payer: Self-pay | Admitting: Neurology

## 2014-06-04 DIAGNOSIS — G4733 Obstructive sleep apnea (adult) (pediatric): Secondary | ICD-10-CM

## 2014-06-04 DIAGNOSIS — G4719 Other hypersomnia: Secondary | ICD-10-CM

## 2014-06-04 DIAGNOSIS — Z9989 Dependence on other enabling machines and devices: Secondary | ICD-10-CM

## 2014-06-04 NOTE — Telephone Encounter (Signed)
Patient called requesting a refill for modafinil (PROVIGIL) 100 MG tablet. [facility]  Please call and advice # [phone]

## 2014-06-05 ENCOUNTER — Telehealth: Payer: Self-pay | Admitting: *Deleted

## 2014-06-05 MED ORDER — MODAFINIL 100 MG PO TABS: 100.0000 mg | ORAL_TABLET | Freq: Two times a day (BID) | ORAL | Status: DC

## 2014-06-05 NOTE — Telephone Encounter (Addendum)
I appears the patient called at 4:52 pm yesterday evening, and the request was sent to provider.  This was approved today.  I called the patient back to advise.  He is aware.

## 2014-06-14 ENCOUNTER — Encounter: Payer: Self-pay | Admitting: Family Medicine

## 2014-06-16 ENCOUNTER — Ambulatory Visit (INDEPENDENT_AMBULATORY_CARE_PROVIDER_SITE_OTHER): Payer: Medicare Other | Admitting: Gastroenterology

## 2014-06-16 ENCOUNTER — Encounter: Payer: Self-pay | Admitting: Gastroenterology

## 2014-06-16 VITALS — BP 140/80 | HR 72 | Ht 67.0 in | Wt 217.0 lb

## 2014-06-16 DIAGNOSIS — K635 Polyp of colon: Secondary | ICD-10-CM | POA: Diagnosis not present

## 2014-06-16 DIAGNOSIS — R197 Diarrhea, unspecified: Secondary | ICD-10-CM | POA: Diagnosis not present

## 2014-06-16 DIAGNOSIS — Z860100 Personal history of colon polyps, unspecified: Secondary | ICD-10-CM

## 2014-06-16 DIAGNOSIS — Z1211 Encounter for screening for malignant neoplasm of colon: Secondary | ICD-10-CM | POA: Diagnosis not present

## 2014-06-16 DIAGNOSIS — R194 Change in bowel habit: Secondary | ICD-10-CM | POA: Diagnosis not present

## 2014-06-16 MED ORDER — NA SULFATE-K SULFATE-MG SULF 17.5-3.13-1.6 GM/177ML PO SOLN: 1.0000 | Freq: Once | ORAL | Status: DC

## 2014-06-16 NOTE — Progress Notes (Signed)
_                                                                                                                History of Present Illness:  Gabriel [name] is a 69 year old white male referred at the request of Dr. Danise Mina for evaluation of change of bowel habits.  Over the last 6-8 months he's noted change in bowel habits consisting of poorly formed to loose stools.  This occurs several times a week.  He no longer tolerates dairy products.  He denies rectal bleeding or abdominal pain.  Several months ago he was started on Provigil because of sleep apnea.  He has a history of colon polyps.  Last colonoscopy in 2010 was negative for polyps and showed only few diverticula.  He was told to undergo a recall in 5 years.  There've been no other changes in medications or diet.   Past Medical History  Diagnosis Date  . Seronegative arthritis     RA, previously on MTX, RF <7, ANA nl (rheum Dr. Donnamarie Poag)  . Hematuria - cause not known     s/p urology workup 2010, nl CT, cystoscopy (doesn't want rpt), cytology  . History of chicken pox   . Depression with anxiety     h/o remote hospitalization for depression  . GERD (gastroesophageal reflux disease)   . History of colon polyps 2005    tubular adenoma, on rpt no more (2010)  . OSA on CPAP     cpap machine at 10cm, ambien, daytime drowsiness attributed to OSA, last changed dextroamphetamine to nuvigil, previously tried provigil  . Torn rotator cuff     bilateral, s/p L repair, chronic R RTC tear (supraspinatus)  . Chronic lower back pain     DDD, prior on lyrica, gabapentin, lumbar stenosis with persistent numbness R ant thigh, ESI didn't help, now seeing Branch  . BPH (benign prostatic hypertrophy)     h/o prostate nodule, followed by Dr. Risa Grill yearly  . Insomnia     Dohmeier  . Cervical neck pain with evidence of disc disease     h/o bulging discs  . Prostate nodule     followed by Risa Grill, reassuring exam and PSA   Past Surgical  History  Procedure Laterality Date  . Achilles tendon repair  2002  . Cataract extraction  2007    left  . Rotator cuff repair  2008    Left (Dr. Gaylyn Rong in [place], now [place])  . Lumbar disc surgery  07/2010    L3/4 diskectomy (Dr. Harl Bowie at [facility])  . Colonoscopy  11/2008    diverticulosis, rpt 5 yrs  . Mri thoracic  09/2010    thoracic DDD/kyphosis, minimal anterolisthesis T2/3 with B foraminal narrowing  . Upper gastrointestinal endoscopy  02/2005    minimal GERD, small gastric polyp, tiny HH  . Dobutamine stress echo  08/2006    negative for ischemia   family history includes Arthritis in his mother; COPD in his father. There is no history  of Cancer, Stroke, Coronary artery disease, or Diabetes. Current Outpatient Prescriptions  Medication Sig Dispense Refill  . buPROPion (WELLBUTRIN XL) 300 MG 24 hr tablet TAKE 1 TABLET DAILY 90 tablet 3  . Melatonin 5 MG CAPS Take 5 mg by mouth at bedtime as needed.    . meloxicam (MOBIC) 7.5 MG tablet Take 1 tablet (7.5 mg total) by mouth daily. 30 tablet 3  . methocarbamol (ROBAXIN) 750 MG tablet Take 1 tablet (750 mg total) by mouth 3 (three) times daily as needed. 270 tablet 3  . modafinil (PROVIGIL) 100 MG tablet Take 1 tablet (100 mg total) by mouth 2 (two) times daily. 180 tablet 1  . nystatin (MYCOSTATIN) 100000 UNIT/ML suspension Take 5 mLs (500,000 Units total) by mouth 4 (four) times daily as needed. 180 mL 1  . omeprazole (PRILOSEC) 20 MG capsule TAKE 1 CAPSULE DAILY 90 capsule 1  . tamsulosin (FLOMAX) 0.4 MG CAPS capsule 1 capsule daily.    Marland Kitchen zolpidem (AMBIEN) 10 MG tablet Take 0.5 tablets (5 mg total) by mouth at bedtime as needed for sleep. 45 tablet 3  . Na Sulfate-K Sulfate-Mg Sulf (SUPREP BOWEL PREP) SOLN Take 1 kit by mouth once. 1 Bottle 0   No current facility-administered medications for this visit.   Allergies as of 06/16/2014 - Review Complete 06/16/2014  Allergen Reaction Noted  . Amoxicillin Nausea And Vomiting  12/02/2010  . Erythromycin Nausea And Vomiting 12/02/2010  . Sulfa drugs cross reactors Other (See Comments) 12/02/2010    reports that he has never smoked. He has never used smokeless tobacco. He reports that he does not drink alcohol or use illicit drugs.   Review of Systems: He suffers from shoulder elbow and pain in his upper extremities.  Pertinent positive and negative review of systems were noted in the above HPI section. All other review of systems were otherwise negative.  Vital signs were reviewed in today's medical record Physical Exam: General: Well developed , well nourished, very anxious, in no acute distress Skin: anicteric Head: Normocephalic and atraumatic Eyes:  sclerae anicteric, EOMI Ears: Normal auditory acuity Mouth: No deformity or lesions Neck: Supple, no masses or thyromegaly Lymph Nodes: no lymphadenopathy Lungs: Clear throughout to auscultation Heart: Regular rate and rhythm; no murmurs, rubs or bruits Gastroinestinal: Soft, non tender and non distended. No masses, hepatosplenomegaly or hernias noted. Normal Bowel sounds Rectal:deferred Musculoskeletal: Symmetrical with no gross deformities  Skin: No lesions on visible extremities Pulses:  Normal pulses noted Extremities: No clubbing, cyanosis, edema or deformities noted Neurological: Alert oriented x 4, grossly nonfocal Cervical Nodes:  No significant cervical adenopathy Inguinal Nodes: No significant inguinal adenopathy Psychological:  Alert and cooperative. Normal mood and affect  See Assessment and Plan under Problem List

## 2014-06-16 NOTE — Assessment & Plan Note (Addendum)
Mild change of bowel habits may be medication-related.  In an already anxious individual Provigil can increase the level of anxiety which could be causing the loose stools.  He does claim that he has to to constipation as well.  Recommendations #1 fiber supplementation #2 to consider holding Provigil if colonoscopy is normal and symptoms do not improve

## 2014-06-16 NOTE — Assessment & Plan Note (Signed)
History of adenomatous colon polyps.  Last colonoscopy in 2010 negative for polyps.  Plan follow-up colonoscopy

## 2014-06-16 NOTE — Patient Instructions (Signed)

## 2014-06-19 ENCOUNTER — Encounter: Payer: Self-pay | Admitting: Neurology

## 2014-06-19 ENCOUNTER — Ambulatory Visit (INDEPENDENT_AMBULATORY_CARE_PROVIDER_SITE_OTHER): Payer: Medicare Other | Admitting: Neurology

## 2014-06-19 VITALS — BP 136/82 | HR 78 | Resp 18 | Ht 67.0 in | Wt 216.0 lb

## 2014-06-19 DIAGNOSIS — G4710 Hypersomnia, unspecified: Secondary | ICD-10-CM | POA: Diagnosis not present

## 2014-06-19 DIAGNOSIS — G4730 Sleep apnea, unspecified: Secondary | ICD-10-CM

## 2014-06-19 DIAGNOSIS — G4719 Other hypersomnia: Secondary | ICD-10-CM | POA: Diagnosis not present

## 2014-06-19 DIAGNOSIS — G4733 Obstructive sleep apnea (adult) (pediatric): Secondary | ICD-10-CM | POA: Diagnosis not present

## 2014-06-19 DIAGNOSIS — Z9989 Dependence on other enabling machines and devices: Secondary | ICD-10-CM

## 2014-06-19 DIAGNOSIS — M25512 Pain in left shoulder: Secondary | ICD-10-CM

## 2014-06-19 DIAGNOSIS — M25511 Pain in right shoulder: Secondary | ICD-10-CM | POA: Diagnosis not present

## 2014-06-19 MED ORDER — MODAFINIL 100 MG PO TABS: 100.0000 mg | ORAL_TABLET | Freq: Two times a day (BID) | ORAL | Status: DC

## 2014-06-19 NOTE — Patient Instructions (Signed)
We will continue with modafinil 100 mg up to 2 times a day.   Please continue using your CPAP regularly. While your insurance requires that you use CPAP at least 4 hours each night on 70% of the nights, I recommend, that you not skip any nights and use it throughout the night if you can. Getting used to CPAP and staying with the treatment long term does take time and patience and discipline. Untreated obstructive sleep apnea when it is moderate to severe can have an adverse impact on cardiovascular health and raise her risk for heart disease, arrhythmias, hypertension, congestive heart failure, stroke and diabetes. Untreated obstructive sleep apnea causes sleep disruption, nonrestorative sleep, and sleep deprivation. This can have an impact on your day to day functioning and cause daytime sleepiness and impairment of cognitive function, memory loss, mood disturbance, and problems focussing. Using CPAP regularly can improve these symptoms.  You can continue with melatonin 5 mg for sleep.

## 2014-06-19 NOTE — Progress Notes (Signed)
Subjective:    Patient ID: Gabriel [name] is a 69 y.o. male.  HPI      Interim history:    Gabriel [name] is a very friendly 69 year old left-handed woman with an underlying medical history of arthritis of the left shoulder, degenerative disc disease, cervical radiculopathy, depression with anxiety, reflux disease, obstructive sleep apnea, who presents for follow-up consultation of his OSA, treated with CPAP and associated with residual EDS. He is unaccompanied today. I last saw him on 12/18/2013, at which time he reported having trouble sleeping at night. He felt sleepy during the day. He was on Provigil 100 mg once daily. He had more stress and more back pain. He was worried about his wife's health.  Today, 06/19/2014: I reviewed his CPAP compliance data from 05/18/2014 through 06/16/2014 which is a total of 30 days during which time he used his machine every night with percent used days greater than 4 hours at 100%, indicating superb compliance with an average usage of 6 hours and 33 minutes, residual AHI low at 1.2 per hour, leak however high, with the 95th percentile at 40.2 L/m on a pressure of 11 cm with EPR of 2.   Today, 06/19/2014: He reports being compliant with treatment. He has been using 5 mg of Ambien each night and he also uses 5 mg of melatonin each night. Modafinil is marginally helpful he feels. He usually takes 1 in the morning and sometimes a second one in the afternoon. 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 He has a lot of stress at home. His wife is getting more and more debilitated and needs more more help in her daily activities, including dressing and transferring and he feels very exhausted. He has bilateral shoulder pain. He feels that his tendons are worn out. He tries to do water exercises but even those are difficult at times. He has to do a lot of lifting at home. His wife is also in need of help at night. This disrupts his sleep. Sometimes he just cannot go  back to sleep after he is up with her. He feels that when he works he gets a break.  Previously:   I saw him on 07/02/2013, at which time I suggested he return for a full night CPAP study followed by a nap study the next day. He was advised to taper off his nortriptyline and Robaxin as well as stop his Provigil 7-10 days prior to sleep testing. He had a CPAP study on 08/27/2013. Sleep efficiency was 86.9%. Latency to sleep was 3.5 minutes. Wake after sleep onset was 55.5 minutes with moderate to severe sleep fragmentation noted. He had an elevated arousal index at 15.2 per hour. He had an increased percentage of stage 1 sleep and a mildly increased percentage of REM sleep at 26.1% with a mildly prolonged REM latency of 130.5 minutes. He had frequent PVCs. He was on CPAP at the usual pressure of 10 cm which was then increased to 11 and 12 cm. I felt he did a little bit better on 11 cm and since it was not a big change from his baseline treatment pressure we proceeded with the next day nap study. He had an MSLT on 08/28/2013 with 5. Mean sleep latency was 10.4 with no REM onset naps. We called him with his test results on the phone. I did make an adjustment to his CPAP pressure to 11 cm.  I reviewed his compliance data from 11/16/2013 through 12/15/2013 which  is a total of 30 days during which time he used his CPAP every night with percent used days greater than 4 hours of 97%, indicating excellent compliance, average usage of 6 hours and 17 minutes. Residual AHI at 1.2 per hour, leak at times was high with the 95th percentile at 47.3 L/m. Pressure at 11 cm with EPR of 2.   I saw him on 01/14/13, at which time we talked about his sleep study results and his CPAP   compliance which was good. He was well treated in terms of reduction of his residual AHI. I suggested he try Nuvigil as he had some side effects in the past with Provigil. We also talked about potentially bringing him back for further testing with  [facility] down the [place]. In the interim, he was seen by our NP, Ms. Lam, on 04/17/2013, at which time he reported that he could not afford Nuvigil and we decided to prescribe Provigil again. He requested to try it again. We also scheduled him for a repeat sleep study followed by a nap study to further delineate and quantify his sleepiness. I asked him to taper off sedating medications with the help of his primary care physician and also discontinue his Provigil 7-10 days prior to the sleep studies. He was given instructions by his primary care physician to come off of his muscle relaxer and nortriptyline. He did not have his sleep studies done and called to cancel those. I reviewed his CPAP compliance down from 03/18/2013 through 04/16/2013 which is a total of 30 days during which time he uses CPAP every night with 80% used days greater than 4 hours at 100%, indicating superb compliance with an average usage of 7 hours. Residual AHI was acceptable at 2.7 per hour and leak was acceptable at 20.1 L per minute at the 95th percentile.  I reviewed his compliance data from 06/02/2013 through 07/01/2013 which is the last 30 days during which time he was 100% compliant. Average usage was 6 hours and 39 minutes. Residual AHI low at 1 per hour, leak was a time high with the 95th percentile at 34.7 L per minute, pressure at 10 cm with EPR of 2.   I first met him on 10/16/12, at which time, he reported problems initiating and maintaining sleep and ongoing severe EDS. He has been taking Pamelor at bedtime (previously Elavil, but had to stop because of severe mouth dryness) as well as Ambien 5 mg at bedtime and has been on it for about 25 years. I recommended re-evaluation of his OSA first with a split night sleep study. He has this test on 11/21/2012 and I went over his sleep study report in detail with him previously: His baseline sleep efficiency was reduced at 60.5% with a latency to sleep of 14 minutes and wake after sleep  onset of 36 minutes with moderate to severe sleep fragmentation noted. He had a markedly elevated arousal index at 96.5 per hour. There was a markedly increased percentage of stage I sleep, reduced percentage of stage II sleep, and absence of slow-wave sleep and REM sleep prior to CPAP. He had periodic leg movements of 5.5 per hour. He had frequent, unifocal-appearing PVCs. He had mild to moderate snoring. His AHI was highly elevated at 83.9 per hour. His baseline oxygen saturation was 93%, his nadir was 84% in non-REM sleep. He was then titrated on CPAP. His sleep efficiency was significantly increased and his arousal index improved. He had absence of slow wave  sleep and 12.7% of REM sleep. His average oxygen saturation was 93%, his nadir was 82%. He was titrated from 5-11 cm of water pressure and his AHI was reduced to 0 events per hour at the pressure of 10. I also reviewed his compliance data from his machine: From 12/12/2012 through 01/13/2013 which is a total of 33 days during which she used CPAP every day. His average usage was 6 hours and 44 minutes. His residual AHI was 1.7 per hour on the pressure of 10 cm with EPR of 2. He had a fairly high leak at times however.   He is feeling better than before the sleep study. He is compliant with treatment, likes the machine better and the mask as well. She feels, his sleepiness is about the same, but he feels better. He may take a nap without CPAP. He does not keep a very good sleep schedule. He uses the bathroom about once per night.   He was previously diagnosed with OSA in 1998 and was prescribed CPAP of 12 cm it indicated full compliance. I reviewed his CPAP titration study from 01/24/2010 last time. His AHI was reduced to 0 per hour and O2 nadir was 91% for the night. REM latency of 49 minutes, but this was a titration study, and could have shown REM rebound.   He was tried on Provigil years ago, and it did not help and he felt nervous and irritable on it.  He denies cataplexy, or hypogogic or hypnopompic hallucinations, but has had sleep paralysis. He had an MSLT about 10 years ago, which showed no evidence of narcolepsy per patient.    His Past Medical History Is Significant For: Past Medical History  Diagnosis Date  . Seronegative arthritis     RA, previously on MTX, RF <7, ANA nl (rheum Dr. Donnamarie Poag)  . Hematuria - cause not known     s/p urology workup 2010, nl CT, cystoscopy (doesn't want rpt), cytology  . History of chicken pox   . Depression with anxiety     h/o remote hospitalization for depression  . GERD (gastroesophageal reflux disease)   . History of colon polyps 2005    tubular adenoma, on rpt no more (2010)  . OSA on CPAP     cpap machine at 10cm, ambien, daytime drowsiness attributed to OSA, last changed dextroamphetamine to nuvigil, previously tried provigil  . Torn rotator cuff     bilateral, s/p L repair, chronic R RTC tear (supraspinatus)  . Chronic lower back pain     DDD, prior on lyrica, gabapentin, lumbar stenosis with persistent numbness R ant thigh, ESI didn't help, now seeing [facility]  . BPH (benign prostatic hypertrophy)     h/o prostate nodule, followed by Dr. Risa Grill yearly  . Insomnia     D[place]ier  . Cervical neck pain with evidence of disc disease     h/o bulging discs  . Prostate nodule     followed by Risa Grill, reassuring exam and PSA    His Past Surgical History Is Significant For: Past Surgical History  Procedure Laterality Date  . Achilles tendon repair  2002  . Cataract extraction  2007    left  . Rotator cuff repair  2008    Left (Dr. Gaylyn Rong in [place], now [place])  . Lumbar disc surgery  07/2010    L3/4 diskectomy (Dr. Harl Bowie at [facility])  . Colonoscopy  11/2008    diverticulosis, rpt 5 yrs  . Mri thoracic  09/2010  thoracic DDD/kyphosis, minimal anterolisthesis T2/3 with B foraminal narrowing  . Upper gastrointestinal endoscopy  02/2005    minimal GERD, small gastric polyp, tiny HH  .  Dobutamine stress echo  08/2006    negative for ischemia    His Family History Is Significant For: Family History  Problem Relation Age of Onset  . Arthritis Mother   . COPD Father     smoker  . Cancer Neg Hx   . Stroke Neg Hx   . Coronary artery disease Neg Hx   . Diabetes Neg Hx     His Social History Is Significant For: History   Social History  . Marital Status: Married    Spouse Name: [name]  . Number of Children: 2  . Years of Education: Masters +   Occupational History  . Clergyman Other    [employer]   Social History Main Topics  . Smoking status: Never Smoker   . Smokeless tobacco: Never Used  . Alcohol Use: No  . Drug Use: No  . Sexual Activity: Not on file   Other Topics Concern  . None   Social History Narrative   Caffeine: avoids   Patient is married [name]) Lives with wife - who is in a power chair.   Occupation: Scientist, product/process development, Oceanographer   Edu: masters +   Likes to travel   Activity: no regular activity, to join Y   Diet: some fruits/vegetables   Patient has two children.   Patient is left-handed.    His Allergies Are:  Allergies  Allergen Reactions  . Amoxicillin Nausea And Vomiting  . Erythromycin Nausea And Vomiting  . Sulfa Drugs Cross Reactors Other (See Comments)    Thrush  :   His Current Medications Are:  Outpatient Encounter Prescriptions as of 06/19/2014  Medication Sig  . acetaminophen (TYLENOL) 500 MG tablet Take 500 mg by mouth every 6 (six) hours as needed.  Marland Kitchen buPROPion (WELLBUTRIN XL) 300 MG 24 hr tablet TAKE 1 TABLET DAILY  . Melatonin 5 MG CAPS Take 5 mg by mouth at bedtime as needed.  . methocarbamol (ROBAXIN) 750 MG tablet Take 1 tablet (750 mg total) by mouth 3 (three) times daily as needed.  . modafinil (PROVIGIL) 100 MG tablet Take 1 tablet (100 mg total) by mouth 2 (two) times daily.  . Na Sulfate-K Sulfate-Mg Sulf (SUPREP BOWEL PREP) SOLN Take 1 kit by mouth once.  Marland Kitchen omeprazole (PRILOSEC) 20 MG  capsule TAKE 1 CAPSULE DAILY  . tamsulosin (FLOMAX) 0.4 MG CAPS capsule 1 capsule daily.  Marland Kitchen zolpidem (AMBIEN) 10 MG tablet Take 0.5 tablets (5 mg total) by mouth at bedtime as needed for sleep.  . meloxicam (MOBIC) 7.5 MG tablet Take 1 tablet (7.5 mg total) by mouth daily. (Patient not taking: Reported on 06/19/2014)  . nystatin (MYCOSTATIN) 100000 UNIT/ML suspension Take 5 mLs (500,000 Units total) by mouth 4 (four) times daily as needed. (Patient not taking: Reported on 06/19/2014)   No facility-administered encounter medications on file as of 06/19/2014.  :  Review of Systems:  Out of a complete 14 point review of systems, all are reviewed and negative with the exception of these symptoms as listed below:   Review of Systems  All other systems reviewed and are negative.   Objective:  Neurologic Exam  Physical Exam Physical Examination:   Filed Vitals:   06/19/14 1557  BP: 136/82  Pulse: 78  Resp: 18   General Examination: The patient is a very  pleasant 69 y.o. male in no acute distress. He appears well-developed and well-nourished and very well groomed. He is in less good spirits today. His speech is pressured today. He is talking fast.   HEENT: Normocephalic, atraumatic, pupils are equal, round and reactive to light and accommodation. Funduscopic exam is normal with sharp disc margins noted. Extraocular tracking is good without limitation to gaze excursion or nystagmus noted. Normal smooth pursuit is noted. Hearing is grossly intact. Face is symmetric with normal facial animation and normal facial sensation. Speech is clear with no dysarthria noted. There is no hypophonia. There is no lip, neck/head, jaw or voice tremor. Neck is supple with full range of passive and active motion. There are no carotid bruits on auscultation. Oropharynx exam reveals: mild mouth dryness, adequate dental hygiene and moderate airway crowding, due to narrow airway entry and larger tongue. Mallampati is  class III. Tongue protrudes centrally and palate elevates symmetrically. Tonsils are 1+.    Chest: Clear to auscultation without wheezing, rhonchi or crackles noted.  Heart: S1+S2+0, regular and normal without murmurs, rubs or gallops noted.   Abdomen: Soft, non-tender and non-distended with normal bowel sounds appreciated on auscultation.  Extremities: There is no pitting edema in the distal lower extremities bilaterally. Pedal pulses are intact.  Skin: Warm and dry without trophic changes noted. There are mild varicose veins in the right distal leg.  Musculoskeletal: exam reveals no obvious joint deformities, tenderness or joint swelling or erythema he has some decrease in range of motion in both shoulders.   Neurologically:  Mental status: The patient is awake, alert and oriented in all 4 spheres. His memory, attention, language and knowledge are appropriate. There is no aphasia, agnosia, apraxia or anomia. Speech is clear with normal prosody and enunciation. Thought process is linear. Mood is congruent and affect is normal.  Cranial nerves are as described above under HEENT exam. In addition, shoulder shrug is normal with equal shoulder height noted. Motor exam: Normal bulk, strength and tone is noted. There is no drift, tremor or rebound. Romberg is negative. Reflexes are 2+ throughout. Toes are downgoing bilaterally. Fine motor skills are intact with normal finger taps, normal hand movements, normal rapid alternating patting, normal foot taps and normal foot agility.  Cerebellar testing shows no dysmetria or intention tremor on finger to nose testing. Heel to shin is unremarkable bilaterally. There is no truncal or gait ataxia.  Sensory exam is intact to light touch, pinprick, vibration, temperature sense in the upper and lower extremities.  Gait, station and balance are unremarkable. No veering to one side is noted. No leaning to one side is noted. Posture is age-appropriate and stance is  narrow based. No problems turning are noted. He turns en bloc. Tandem walk is unremarkable.         Assessment and Plan:   In summary, DICKY BOER is a very pleasant 68 year old male with an underlying medical history of arthritis of the left shoulder, degenerative disc disease, cervical radiculopathy, depression with anxiety, and reflux disease, who presents for follow up consultation of his obstructive sleep apnea with residual EDS. He is on CPAP with full compliance and has always been fully compliant with treatment. Provigil has helped his daytime somnolence  to some degree. Unfortunately the mask is leaking some. He is encouraged to have it refitted. He typically takes modafinil 100 mg once or twice daily. We again talked about his stress. Thankfully he has some pain help at home for at least  twice a week for dressing and bathing help for his wife. He worries more more about her condition and he feels that he can no longer maintain the care that he has provided for her all these years. He worries about wearing out and has bilateral shoulder pain for which he continues to exercise some. I suggested he continue with Provigil at the current dose. He can continue using melatonin at night. He is advised to talk to his primary care physician about his residual mood related issues including increased stress recently. He is commended for being compliant with CPAP therapy. I would like to see him back in 6 months from now, sooner if the need arises and encouraged him to call with any interim questions or concerns. I answered all his questions today and the patient was in agreement. I spent 25 minutes in total face-to-face time with the patient, more than 50% of which was spent in counseling and coordination of care, reviewing test results, reviewing medication and discussing or reviewing the diagnosis of stress, and sleepiness, and OSA, the prognosis and treatment options.

## 2014-06-25 ENCOUNTER — Other Ambulatory Visit: Payer: Self-pay | Admitting: *Deleted

## 2014-06-25 NOTE — Telephone Encounter (Signed)
Ok to refill? Wants 90 supply to mail order. Will need written script.

## 2014-06-26 MED ORDER — ZOLPIDEM TARTRATE 10 MG PO TABS: 5.0000 mg | ORAL_TABLET | Freq: Every evening | ORAL | Status: DC | PRN

## 2014-06-26 NOTE — Telephone Encounter (Signed)
Rx faxed to [facility].

## 2014-06-26 NOTE — Telephone Encounter (Signed)
printed and in [name]'s box. 

## 2014-07-01 ENCOUNTER — Encounter: Payer: Self-pay | Admitting: Gastroenterology

## 2014-07-01 ENCOUNTER — Ambulatory Visit (AMBULATORY_SURGERY_CENTER): Payer: Medicare Other | Admitting: Gastroenterology

## 2014-07-01 VITALS — BP 122/84 | HR 67 | Temp 96.8°F | Resp 12 | Ht 67.0 in | Wt 217.0 lb

## 2014-07-01 DIAGNOSIS — G4733 Obstructive sleep apnea (adult) (pediatric): Secondary | ICD-10-CM | POA: Diagnosis not present

## 2014-07-01 DIAGNOSIS — D120 Benign neoplasm of cecum: Secondary | ICD-10-CM | POA: Diagnosis not present

## 2014-07-01 DIAGNOSIS — D122 Benign neoplasm of ascending colon: Secondary | ICD-10-CM

## 2014-07-01 DIAGNOSIS — F341 Dysthymic disorder: Secondary | ICD-10-CM | POA: Diagnosis not present

## 2014-07-01 DIAGNOSIS — Z86010 Personal history of colonic polyps: Secondary | ICD-10-CM

## 2014-07-01 DIAGNOSIS — K5730 Diverticulosis of large intestine without perforation or abscess without bleeding: Secondary | ICD-10-CM

## 2014-07-01 MED ORDER — SODIUM CHLORIDE 0.9 % IV SOLN: 500.0000 mL | INTRAVENOUS | Status: DC

## 2014-07-01 NOTE — Op Note (Signed)
[place]  [address]. [place], [id number]   COLONOSCOPY PROCEDURE REPORT  PATIENT: Gabriel [name], Gabriel [name]  MR#: [id number] BIRTHDATE: August 21, 1945 , 76  yrs. old GENDER: male ENDOSCOPIST: Inda Castle, MD REFERRED BY: PROCEDURE DATE:  07/01/2014 PROCEDURE:   Colonoscopy, screening, Colonoscopy with snare polypectomy, and Colonoscopy with cold biopsy polypectomy First Screening Colonoscopy - Avg.  risk and is 50 yrs.  old or older - No.  Prior Negative Screening - Now for repeat screening. Above average risk Prior Negative Screening - Now for repeat screening.  Other: See Comments  History of Adenoma - Now for follow-up colonoscopy & has been > or = to 3 yrs.  Yes hx of adenoma.  Has been 3 or more years since last colonoscopy.  Polyps removed today? Yes ASA CLASS:   Class II INDICATIONS:PH Colon Adenoma and change in bowel habits. MEDICATIONS: Monitored anesthesia care and Propofol 250 mg IV  DESCRIPTION OF PROCEDURE:   After the risks benefits and alternatives of the procedure were thoroughly explained, informed consent was obtained.  The digital rectal exam revealed no abnormalities of the rectum.   The LB [id number]  endoscope was introduced through the anus and advanced to the cecum, which was identified by both the appendix and ileocecal valve. No adverse events experienced.   The quality of the prep was (Suprep was used) good.  The instrument was then slowly withdrawn as the colon was fully examined. Estimated blood loss is zero unless otherwise noted in this procedure report.      COLON FINDINGS: There was mild diverticulosis noted in the ascending colon and at the cecum.   A sessile polyp measuring 2 mm in size was found in the ascending colon.  A polypectomy was performed with cold forceps.   A sessile polyp measuring 3 mm in size was found in the ascending colon.  A polypectomy was performed with a cold snare.  The resection was complete,  the polyp tissue was completely retrieved and sent to histology.  Retroflexed views revealed no abnormalities. The time to cecum = 5.6 Withdrawal time = 10.8   The scope was withdrawn and the procedure completed. COMPLICATIONS: There were no immediate complications.  ENDOSCOPIC IMPRESSION: 1.   Mild diverticulosis was noted in the ascending colon and at the cecum 2.   Sessile polyp was found in the ascending colon; polypectomy was performed with cold forceps 3.   Sessile polyp was found in the ascending colon; polypectomy was performed with a cold snare  RECOMMENDATIONS: 1.  If the polyp(s) removed today are proven to be adenomatous (pre-cancerous) polyps, you will need a repeat colonoscopy in 5 years.  Otherwise you should continue to follow colorectal cancer screening guidelines for "routine risk" patients with colonoscopy in 10 years.  You will receive a letter within 1-2 weeks with the results of your biopsy as well as final recommendations.  Please call my office if you have not received a letter after 3 weeks. 2.  High fiber diet  eSigned:  Inda Castle, MD 07/01/2014 10:29 AM   cc:   PATIENT NAME:  Jill, Ruppe MR#: [id number]

## 2014-07-01 NOTE — Patient Instructions (Addendum)
Call office in 3 weeks to report progress on high-fiber dietYOU HAD AN ENDOSCOPIC PROCEDURE TODAY AT [facility]:   Refer to the procedure report that was given to you for any specific questions about what was found during the examination.  If the procedure report does not answer your questions, please call your gastroenterologist to clarify.  If you requested that your care partner not be given the details of your procedure findings, then the procedure report has been included in a sealed envelope for you to review at your convenience later.  YOU SHOULD EXPECT: Some feelings of bloating in the abdomen. Passage of more gas than usual.  Walking can help get rid of the air that was put into your GI tract during the procedure and reduce the bloating. If you had a lower endoscopy (such as a colonoscopy or flexible sigmoidoscopy) you may notice spotting of blood in your stool or on the toilet paper. If you underwent a bowel prep for your procedure, you may not have a normal bowel movement for a few days.  Please Note:  You might notice some irritation and congestion in your nose or some drainage.  This is from the oxygen used during your procedure.  There is no need for concern and it should clear up in a day or so.  SYMPTOMS TO REPORT IMMEDIATELY:   Following lower endoscopy (colonoscopy or flexible sigmoidoscopy):  Excessive amounts of blood in the stool  Significant tenderness or worsening of abdominal pains  Swelling of the abdomen that is new, acute  Fever of 100F or higher   For urgent or emergent issues, a gastroenterologist can be reached at any hour by calling [phone].   DIET: Your first meal following the procedure should be a small meal and then it is ok to progress to your normal diet. Heavy or fried foods are harder to digest and may make you feel nauseous or bloated.  Likewise, meals heavy in dairy and vegetables can increase bloating.  Drink plenty of fluids but  you should avoid alcoholic beverages for 24 hours.  ACTIVITY:  You should plan to take it easy for the rest of today and you should NOT DRIVE or use heavy machinery until tomorrow (because of the sedation medicines used during the test).    FOLLOW UP: Our staff will call the number listed on your records the next business day following your procedure to check on you and address any questions or concerns that you may have regarding the information given to you following your procedure. If we do not reach you, we will leave a message.  However, if you are feeling well and you are not experiencing any problems, there is no need to return our call.  We will assume that you have returned to your regular daily activities without incident.  If any biopsies were taken you will be contacted by phone or by letter within the next 1-3 weeks.  Please call us at [phone] if you have not heard about the biopsies in 3 weeks.    SIGNATURES/CONFIDENTIALITY: You and/or your care partner have signed paperwork which will be entered into your electronic medical record.  These signatures attest to the fact that that the information above on your After Visit Summary has been reviewed and is understood.  Full responsibility of the confidentiality of this discharge information lies with you and/or your care-partner.  READ ALL OF THE HANDOUTS GIVEN TO YOU BY YOUR RECOVERY ROOM NURSE

## 2014-07-01 NOTE — Progress Notes (Signed)
Called to room to assist during endoscopic procedure.  Patient ID and intended procedure confirmed with present staff. Received instructions for my participation in the procedure from the performing physician.  

## 2014-07-01 NOTE — Progress Notes (Signed)
To PACU. Awake and Alert. Pleased with MAC. Report to RN

## 2014-07-02 ENCOUNTER — Telehealth: Payer: Self-pay

## 2014-07-02 NOTE — Telephone Encounter (Signed)
  Follow up Call-  Call back number 07/01/2014  Post procedure Call Back phone  # [phone]  Permission to leave phone message Yes     Patient questions:  Do you have a fever, pain , or abdominal swelling? No. Pain Score  0 *  Have you tolerated food without any problems? Yes.    Have you been able to return to your normal activities? Yes.    Do you have any questions about your discharge instructions: Diet   No. Medications  No. Follow up visit  No.  Do you have questions or concerns about your Care? No.  Actions: * If pain score is 4 or above: No action needed, pain <4.

## 2014-07-09 HISTORY — PX: COLONOSCOPY: SHX174

## 2014-07-10 ENCOUNTER — Encounter: Payer: Self-pay | Admitting: Gastroenterology

## 2014-07-15 ENCOUNTER — Encounter: Payer: Self-pay | Admitting: Family Medicine

## 2014-08-15 ENCOUNTER — Ambulatory Visit (INDEPENDENT_AMBULATORY_CARE_PROVIDER_SITE_OTHER): Payer: Medicare Other | Admitting: Family Medicine

## 2014-08-15 ENCOUNTER — Encounter: Payer: Self-pay | Admitting: Family Medicine

## 2014-08-15 VITALS — BP 126/88 | HR 86 | Temp 98.7°F | Ht 67.0 in | Wt 218.6 lb

## 2014-08-15 DIAGNOSIS — J069 Acute upper respiratory infection, unspecified: Secondary | ICD-10-CM

## 2014-08-15 NOTE — Progress Notes (Signed)
HPI:  URI: -started: 2-3 days ago -symptoms:nasal congestion, sore throat, cough, PND -denies:fever, SOB, NVD, tooth pain, sinus pain -has tried: hycodan cough syrup he has at home not expired and OTC cold medication  -sick contacts/travel/risks: denies flu exposure, tick exposure or or Ebola risks -no hx of lung disease, not a smoker  ROS: See pertinent positives and negatives per HPI.  Past Medical History  Diagnosis Date  . Seronegative arthritis     RA, previously on MTX, RF <7, ANA nl (rheum Dr. Donnamarie Poag)  . Hematuria - cause not known     s/p urology workup 2010, nl CT, cystoscopy (doesn't want rpt), cytology  . History of chicken pox   . Depression with anxiety     h/o remote hospitalization for depression  . GERD (gastroesophageal reflux disease)   . History of colon polyps 2005    tubular adenoma, on rpt no more (2010)  . OSA on CPAP     cpap machine at 10cm, ambien, daytime drowsiness attributed to OSA, last changed dextroamphetamine to nuvigil, previously tried provigil  . Torn rotator cuff     bilateral, s/p L repair, chronic R RTC tear (supraspinatus)  . Chronic lower back pain     DDD, prior on lyrica, gabapentin, lumbar stenosis with persistent numbness R ant thigh, ESI didn't help, now seeing [facility]  . BPH (benign prostatic hypertrophy)     h/o prostate nodule, followed by Dr. Risa Grill yearly  . Insomnia     [place]ier  . Cervical neck pain with evidence of disc disease     h/o bulging discs  . Prostate nodule     followed by Risa Grill, reassuring exam and PSA    Past Surgical History  Procedure Laterality Date  . Achilles tendon repair  2002  . Cataract extraction  2007    left  . Rotator cuff repair  2008    Left (Dr. Gaylyn Rong in [place], now [place])  . Lumbar disc surgery  07/2010    L3/4 diskectomy ([facility]. Harl Bowie at [facility])  . Colonoscopy  11/2008    diverticulosis, rpt 5 yrs  . Mri thoracic  09/2010    thoracic DDD/kyphosis, minimal anterolisthesis T2/3  with B foraminal narrowing  . Upper gastrointestinal endoscopy  02/2005    minimal GERD, small gastric polyp, tiny HH  . Dobutamine stress echo  08/2006    negative for ischemia  . Colonoscopy  07/2014    2 TA, mild diverticulosis, rpt 5 yrs [name])    Family History  Problem Relation Age of Onset  . Arthritis Mother   . COPD Father     smoker  . Cancer Neg Hx   . Stroke Neg Hx   . Coronary artery disease Neg Hx   . Diabetes Neg Hx     History   Social History  . Marital Status: Married    Spouse Name: [name]  . Number of Children: 2  . Years of Education: Masters +   Occupational History  . Clergyman Other    [employer]   Social History Main Topics  . Smoking status: Never Smoker   . Smokeless tobacco: Never Used  . Alcohol Use: No  . Drug Use: No  . Sexual Activity: Not on file   Other Topics Concern  . None   Social History Narrative   Caffeine: avoids   Patient is married [name]) Lives with wife - who is in a power chair.   Occupation: Scientist, product/process development, Oceanographer  Edu: masters +   Likes to travel   Activity: no regular activity, to join Y   Diet: some fruits/vegetables   Patient has two children.   Patient is left-handed.     Current outpatient prescriptions:  .  acetaminophen (TYLENOL) 500 MG tablet, Take 500 mg by mouth every 6 (six) hours as needed., Disp: , Rfl:  .  buPROPion (WELLBUTRIN XL) 300 MG 24 hr tablet, TAKE 1 TABLET DAILY, Disp: 90 tablet, Rfl: 3 .  HYDROCODONE-CHLORPHENIRAMINE PO, Take by mouth as needed., Disp: , Rfl:  .  Melatonin 5 MG CAPS, Take 5 mg by mouth at bedtime as needed., Disp: , Rfl:  .  meloxicam (MOBIC) 7.5 MG tablet, Take 1 tablet (7.5 mg total) by mouth daily., Disp: 30 tablet, Rfl: 3 .  methocarbamol (ROBAXIN) 750 MG tablet, Take 1 tablet (750 mg total) by mouth 3 (three) times daily as needed., Disp: 270 tablet, Rfl: 3 .  modafinil (PROVIGIL) 100 MG tablet, Take 1 tablet (100 mg total) by mouth 2 (two)  times daily., Disp: 180 tablet, Rfl: 3 .  omeprazole (PRILOSEC) 20 MG capsule, TAKE 1 CAPSULE DAILY, Disp: 90 capsule, Rfl: 1 .  tamsulosin (FLOMAX) 0.4 MG CAPS capsule, 1 capsule daily., Disp: , Rfl:  .  zolpidem (AMBIEN) 10 MG tablet, Take 0.5 tablets (5 mg total) by mouth at bedtime as needed for sleep., Disp: 45 tablet, Rfl: 0  EXAM:  Filed Vitals:   08/15/14 1408  BP: 126/88  Pulse: 86  Temp: 98.7 F (37.1 C)    Body mass index is 34.23 kg/(m^2).  GENERAL: vitals reviewed and listed above, alert, oriented, appears well hydrated and in no acute distress  HEENT: atraumatic, conjunttiva clear, no obvious abnormalities on inspection of external nose and ears, normal appearance of ear canals and TMs, clear nasal congestion, mild post oropharyngeal erythema with PND, no tonsillar edema or exudate, no sinus TTP  NECK: no obvious masses on inspection  LUNGS: clear to auscultation bilaterally, no wheezes, rales or rhonchi, good air movement  CV: HRRR, no peripheral edema  MS: moves all extremities without noticeable abnormality  PSYCH: pleasant and cooperative, no obvious depression or anxiety  ASSESSMENT AND PLAN:  Discussed the following assessment and plan:  Acute upper respiratory infection  -given HPI and exam findings today, a serious infection or illness is unlikely. We discussed potential etiologies, with VURI being most likely, and advised supportive care and monitoring. We discussed treatment side effects, likely course, antibiotic misuse, transmission, and signs of developing a serious illness. -of course, we advised to return or notify a doctor immediately if symptoms worsen or persist or new concerns arise.    Patient Instructions  INSTRUCTIONS FOR UPPER RESPIRATORY INFECTION:  -plenty of rest and fluids  -nasal saline wash 2-3 times daily (use prepackaged nasal saline or bottled/distilled water if making your own)   -can use AFRIN nasal spray for drainage  and nasal congestion - but do NOT use longer then 3-4 days  -can use tylenol (in no history of liver disease) or ibuprofen (if no history of kidney disease, bowel bleeding or significant heart disease) as directed for aches and sorethroat  -in the winter time, using a humidifier at night is helpful (please follow cleaning instructions)  -if you are taking a cough medication - use only as directed, may also try a teaspoon of honey to coat the throat and throat lozenges. If given a cough medication with codeine or hydrocodone or other narcotic please be  advised that this contains a strong and  potentially addicting medication. Please follow instructions carefully, take as little as possible and only use AS NEEDED for severe cough. Discuss potential side effects with your pharmacy. Please do not drive or operate machinery while taking these types of medications. Please do not take other sedating medications, drugs or alcohol while taking this medication without discussing with your doctor.  -for sore throat, salt water gargles can help  -follow up if you have fevers, facial pain, tooth pain, difficulty breathing or are worsening or symptoms persist longer then expected  Upper Respiratory Infection, Adult An upper respiratory infection (URI) is also known as the common cold. It is often caused by a type of germ (virus). Colds are easily spread (contagious). You can pass it to others by kissing, coughing, sneezing, or drinking out of the same glass. Usually, you get better in 1 to 3  weeks.  However, the cough can last for even longer. HOME CARE   Only take medicine as told by your doctor. Follow instructions provided above.  Drink enough water and fluids to keep your pee (urine) clear or pale yellow.  Get plenty of rest.  Return to work when your temperature is < 100 for 24 hours or as told by your doctor. You may use a face mask and wash your hands to stop your cold from spreading. GET HELP RIGHT  AWAY IF:   After the first few days, you feel you are getting worse.  You have questions about your medicine.  You have chills, shortness of breath, or red spit (mucus).  You have pain in the face for more then 1-2 days, especially when you bend forward.  You have a fever, puffy (swollen) neck, pain when you swallow, or white spots in the back of your throat.  You have a bad headache, ear pain, sinus pain, or chest pain.  You have a high-pitched whistling sound when you breathe in and out (wheezing).  You cough up blood.  You have sore muscles or a stiff neck. MAKE SURE YOU:   Understand these instructions.  Will watch your condition.  Will get help right away if you are not doing well or get worse. Document Released: 07/13/2007 Document Revised: 04/18/2011 Document Reviewed: 05/01/2013 [facility] Patient Information 2015 [place], [place]. This information is not intended to replace advice given to you by your health care provider. Make sure you discuss any questions you have with your health care provider.      Colin Benton R.

## 2014-08-15 NOTE — Progress Notes (Signed)
Pre visit review using our clinic review tool, if applicable. No additional management support is needed unless otherwise documented below in the visit note. 

## 2014-08-15 NOTE — Patient Instructions (Signed)

## 2014-09-29 DIAGNOSIS — Z961 Presence of intraocular lens: Secondary | ICD-10-CM | POA: Diagnosis not present

## 2014-10-06 DIAGNOSIS — M5412 Radiculopathy, cervical region: Secondary | ICD-10-CM | POA: Diagnosis not present

## 2014-10-10 ENCOUNTER — Other Ambulatory Visit: Payer: Self-pay | Admitting: *Deleted

## 2014-10-10 NOTE — Telephone Encounter (Signed)
Ok to refill 90 day supply to mail order? Will need written Rx to fax.

## 2014-10-14 MED ORDER — ZOLPIDEM TARTRATE 10 MG PO TABS: 5.0000 mg | ORAL_TABLET | Freq: Every evening | ORAL | Status: DC | PRN

## 2014-10-14 NOTE — Telephone Encounter (Signed)
Rx faxed to mail order 

## 2014-10-14 NOTE — Telephone Encounter (Signed)
Printed and in [name]'s box 

## 2014-10-15 ENCOUNTER — Telehealth: Payer: Self-pay

## 2014-10-15 NOTE — Telephone Encounter (Signed)
Received electronic refill request for Zolpidem Tartrate 10mg  tabs. Okay to refill?

## 2014-10-15 NOTE — Telephone Encounter (Signed)
This was faxed to mail order yesterday. It was just crossing paths.

## 2014-10-15 NOTE — Telephone Encounter (Signed)
plz check with pharmacy - this should have been phoned in yesterday.

## 2014-11-10 DIAGNOSIS — M5412 Radiculopathy, cervical region: Secondary | ICD-10-CM | POA: Diagnosis not present

## 2014-11-10 DIAGNOSIS — M25511 Pain in right shoulder: Secondary | ICD-10-CM | POA: Diagnosis not present

## 2014-11-13 DIAGNOSIS — M7542 Impingement syndrome of left shoulder: Secondary | ICD-10-CM | POA: Diagnosis not present

## 2014-11-13 DIAGNOSIS — M25512 Pain in left shoulder: Secondary | ICD-10-CM | POA: Diagnosis not present

## 2014-11-13 DIAGNOSIS — M25511 Pain in right shoulder: Secondary | ICD-10-CM | POA: Diagnosis not present

## 2014-11-13 DIAGNOSIS — M7541 Impingement syndrome of right shoulder: Secondary | ICD-10-CM | POA: Diagnosis not present

## 2014-11-15 ENCOUNTER — Encounter: Payer: Self-pay | Admitting: Family Medicine

## 2014-11-17 ENCOUNTER — Ambulatory Visit (INDEPENDENT_AMBULATORY_CARE_PROVIDER_SITE_OTHER): Payer: Medicare Other

## 2014-11-17 DIAGNOSIS — Z23 Encounter for immunization: Secondary | ICD-10-CM | POA: Diagnosis not present

## 2014-11-18 DIAGNOSIS — M7542 Impingement syndrome of left shoulder: Secondary | ICD-10-CM | POA: Diagnosis not present

## 2014-11-18 DIAGNOSIS — M25512 Pain in left shoulder: Secondary | ICD-10-CM | POA: Diagnosis not present

## 2014-11-18 DIAGNOSIS — M7541 Impingement syndrome of right shoulder: Secondary | ICD-10-CM | POA: Diagnosis not present

## 2014-11-18 DIAGNOSIS — M25511 Pain in right shoulder: Secondary | ICD-10-CM | POA: Diagnosis not present

## 2014-11-20 DIAGNOSIS — M25512 Pain in left shoulder: Secondary | ICD-10-CM | POA: Diagnosis not present

## 2014-11-20 DIAGNOSIS — M25511 Pain in right shoulder: Secondary | ICD-10-CM | POA: Diagnosis not present

## 2014-11-20 DIAGNOSIS — M7541 Impingement syndrome of right shoulder: Secondary | ICD-10-CM | POA: Diagnosis not present

## 2014-11-20 DIAGNOSIS — M7542 Impingement syndrome of left shoulder: Secondary | ICD-10-CM | POA: Diagnosis not present

## 2014-11-25 DIAGNOSIS — M25511 Pain in right shoulder: Secondary | ICD-10-CM | POA: Diagnosis not present

## 2014-11-25 DIAGNOSIS — M7541 Impingement syndrome of right shoulder: Secondary | ICD-10-CM | POA: Diagnosis not present

## 2014-11-25 DIAGNOSIS — M25512 Pain in left shoulder: Secondary | ICD-10-CM | POA: Diagnosis not present

## 2014-11-27 DIAGNOSIS — M25511 Pain in right shoulder: Secondary | ICD-10-CM | POA: Diagnosis not present

## 2014-11-27 DIAGNOSIS — M7541 Impingement syndrome of right shoulder: Secondary | ICD-10-CM | POA: Diagnosis not present

## 2014-11-27 DIAGNOSIS — M25512 Pain in left shoulder: Secondary | ICD-10-CM | POA: Diagnosis not present

## 2014-11-27 DIAGNOSIS — M7542 Impingement syndrome of left shoulder: Secondary | ICD-10-CM | POA: Diagnosis not present

## 2014-12-03 DIAGNOSIS — M7542 Impingement syndrome of left shoulder: Secondary | ICD-10-CM | POA: Diagnosis not present

## 2014-12-03 DIAGNOSIS — M7541 Impingement syndrome of right shoulder: Secondary | ICD-10-CM | POA: Diagnosis not present

## 2014-12-03 DIAGNOSIS — M25512 Pain in left shoulder: Secondary | ICD-10-CM | POA: Diagnosis not present

## 2014-12-03 DIAGNOSIS — M25511 Pain in right shoulder: Secondary | ICD-10-CM | POA: Diagnosis not present

## 2014-12-04 DIAGNOSIS — M7541 Impingement syndrome of right shoulder: Secondary | ICD-10-CM | POA: Diagnosis not present

## 2014-12-04 DIAGNOSIS — M7542 Impingement syndrome of left shoulder: Secondary | ICD-10-CM | POA: Diagnosis not present

## 2014-12-04 DIAGNOSIS — M25512 Pain in left shoulder: Secondary | ICD-10-CM | POA: Diagnosis not present

## 2014-12-04 DIAGNOSIS — M25511 Pain in right shoulder: Secondary | ICD-10-CM | POA: Diagnosis not present

## 2014-12-09 ENCOUNTER — Other Ambulatory Visit: Payer: Self-pay | Admitting: Family Medicine

## 2014-12-10 ENCOUNTER — Other Ambulatory Visit: Payer: Self-pay | Admitting: *Deleted

## 2014-12-10 NOTE — Telephone Encounter (Signed)
Ok to refill to mail order? Requests 90 day supply. Will need written Rx to fax.

## 2014-12-11 MED ORDER — ZOLPIDEM TARTRATE 10 MG PO TABS: 5.0000 mg | ORAL_TABLET | Freq: Every evening | ORAL | Status: DC | PRN

## 2014-12-11 NOTE — Telephone Encounter (Signed)
Printed and in [name]'s box 

## 2014-12-11 NOTE — Telephone Encounter (Signed)
Rx faxed to [facility].

## 2014-12-24 ENCOUNTER — Ambulatory Visit: Payer: Self-pay | Admitting: Neurology

## 2014-12-29 ENCOUNTER — Encounter: Payer: Self-pay | Admitting: Family Medicine

## 2014-12-29 ENCOUNTER — Ambulatory Visit (INDEPENDENT_AMBULATORY_CARE_PROVIDER_SITE_OTHER): Payer: Medicare Other | Admitting: Family Medicine

## 2014-12-29 VITALS — BP 144/90 | HR 66 | Temp 99.0°F | Wt 220.0 lb

## 2014-12-29 DIAGNOSIS — J019 Acute sinusitis, unspecified: Secondary | ICD-10-CM | POA: Diagnosis not present

## 2014-12-29 MED ORDER — HYDROCODONE-HOMATROPINE 5-1.5 MG/5ML PO SYRP: 5.0000 mL | ORAL_SOLUTION | Freq: Every evening | ORAL | Status: DC | PRN

## 2014-12-29 MED ORDER — AZITHROMYCIN 250 MG PO TABS: ORAL_TABLET | ORAL | Status: DC

## 2014-12-29 NOTE — Assessment & Plan Note (Signed)
Anticipate viral given short duration. Discussed with patient. rec fluids, rest, plain mucinex, hycodan cough syrup he has at home, and provided with WASP for zpack with instructions on when to fill.  Update if not improving with treatment. Pt agrees with plan.

## 2014-12-29 NOTE — Progress Notes (Signed)
BP 144/90 mmHg  Pulse 66  Temp(Src) 99 F (37.2 C) (Oral)  Wt 220 lb (99.791 kg)  SpO2 97%   CC: sinusitis  Subjective:    Patient ID: Gabriel [name], male    DOB: Jul 18, 1945, 69 y.o.   MRN: [id number]  HPI: Gabriel [name] is a 68 y.o. male presenting on 12/29/2014 for Sinusitis   5d h/o sneezing, coughing productive of yellow mucous, PNdrainage. Mild dyspnea. More fatigued as well. Head > chest congestion  No fevers/chills, ear or tooth pain, headache, wheezing.   No sick contacts.  + smoke exposure.  No asthma or COPD.  So far has tried hycodan cough syrup, halls cough drops, chloraseptic spray.   Relevant past medical, surgical, family and social history reviewed and updated as indicated. Interim medical history since our last visit reviewed. Allergies and medications reviewed and updated. Current Outpatient Prescriptions on File Prior to Visit  Medication Sig  . acetaminophen (TYLENOL) 500 MG tablet Take 500 mg by mouth every 6 (six) hours as needed.  Marland Kitchen buPROPion (WELLBUTRIN XL) 300 MG 24 hr tablet Take one tablet daily **MUST HAVE PHYSICAL FOR FURTHER REFILLS**  . HYDROCODONE-CHLORPHENIRAMINE PO Take by mouth as needed.  . Melatonin 5 MG CAPS Take 5 mg by mouth at bedtime as needed.  . meloxicam (MOBIC) 7.5 MG tablet Take 1 tablet (7.5 mg total) by mouth daily. (Patient taking differently: Take 7.5 mg by mouth daily as needed. )  . methocarbamol (ROBAXIN) 750 MG tablet Take 1 tablet (750 mg total) by mouth 3 (three) times daily as needed.  . modafinil (PROVIGIL) 100 MG tablet Take 1 tablet (100 mg total) by mouth 2 (two) times daily.  Marland Kitchen omeprazole (PRILOSEC) 20 MG capsule TAKE 1 CAPSULE DAILY  . tamsulosin (FLOMAX) 0.4 MG CAPS capsule 1 capsule daily.  Marland Kitchen zolpidem (AMBIEN) 10 MG tablet Take 0.5 tablets (5 mg total) by mouth at bedtime as needed for sleep.   No current facility-administered medications on file prior to visit.    Review of Systems Per HPI unless  specifically indicated in ROS section     Objective:    BP 144/90 mmHg  Pulse 66  Temp(Src) 99 F (37.2 C) (Oral)  Wt 220 lb (99.791 kg)  SpO2 97%  Wt Readings from Last 3 Encounters:  12/29/14 220 lb (99.791 kg)  08/15/14 218 lb 9.6 oz (99.156 kg)  07/01/14 217 lb (98.431 kg)    Physical Exam  Constitutional: He appears well-developed and well-nourished. No distress.  HENT:  Head: Normocephalic and atraumatic.  Right Ear: Hearing, tympanic membrane, external ear and ear canal normal.  Left Ear: Hearing, tympanic membrane, external ear and ear canal normal.  Nose: Nose normal. No mucosal edema or rhinorrhea. Right sinus exhibits no maxillary sinus tenderness and no frontal sinus tenderness. Left sinus exhibits no maxillary sinus tenderness and no frontal sinus tenderness.  Mouth/Throat: Uvula is midline and mucous membranes are normal. Posterior oropharyngeal edema and posterior oropharyngeal erythema present. No oropharyngeal exudate or tonsillar abscesses.  Nasal mucosal inflammation  Eyes: Conjunctivae and EOM are normal. Pupils are equal, round, and reactive to light. No scleral icterus.  Neck: Normal range of motion. Neck supple.  Cardiovascular: Normal rate, regular rhythm, normal heart sounds and intact distal pulses.   No murmur heard. Pulmonary/Chest: Effort normal and breath sounds normal. No respiratory distress. He has no wheezes. He has no rales.  Lungs clear  Lymphadenopathy:    He has no cervical adenopathy.  Skin:  Skin is warm and dry. No rash noted.  Nursing note and vitals reviewed.      Assessment & Plan:   Problem List Items Addressed This Visit    Acute sinusitis - Primary    Anticipate viral given short duration. Discussed with patient. rec fluids, rest, plain mucinex, hycodan cough syrup he has at home, and provided with WASP for zpack with instructions on when to fill.  Update if not improving with treatment. Pt agrees with plan.      Relevant  Medications   azithromycin (ZITHROMAX) 250 MG tablet   HYDROcodone-homatropine (HYCODAN) 5-1.5 MG/5ML syrup       Follow up plan: Return if symptoms worsen or fail to improve.

## 2014-12-29 NOTE — Patient Instructions (Addendum)
I think you have viral upper respiratory infection possibly sinusitis, but likely viral as early on  Treat with hycodan cough syrup at home - nightly. Push fluids and rest May use allergy medicine or plain mucinex with plenty of water to help mobilize mucous out. Watch for fever >101, worsening productive cough, or symptoms worsening past 7-10 days. If this happens, fill zpack antibiotic, prescription provided today.

## 2014-12-29 NOTE — Progress Notes (Signed)
Pre visit review using our clinic review tool, if applicable. No additional management support is needed unless otherwise documented below in the visit note. 

## 2015-01-05 ENCOUNTER — Encounter: Payer: Self-pay | Admitting: Primary Care

## 2015-01-05 ENCOUNTER — Ambulatory Visit (INDEPENDENT_AMBULATORY_CARE_PROVIDER_SITE_OTHER): Payer: Medicare Other | Admitting: Primary Care

## 2015-01-05 ENCOUNTER — Telehealth: Payer: Self-pay

## 2015-01-05 VITALS — BP 126/88 | HR 69 | Temp 98.1°F | Ht 67.0 in | Wt 218.8 lb

## 2015-01-05 DIAGNOSIS — J209 Acute bronchitis, unspecified: Secondary | ICD-10-CM | POA: Diagnosis not present

## 2015-01-05 MED ORDER — LEVOFLOXACIN 500 MG PO TABS: 500.0000 mg | ORAL_TABLET | Freq: Every day | ORAL | Status: DC

## 2015-01-05 NOTE — Telephone Encounter (Signed)
Pt seen 12/29/14; pt started zpak on 01/01/15 and pt said had pain all over body after taking zpak and pt did not take anymore of abx.(did not add to allergy list until pt discusses with provider); prod cough with clear to yellow phlegm and now pt is wheezing. No known fever or SOB. Pt scheduled to see Allie Bossier NP 01/05/15 at 10:45.

## 2015-01-05 NOTE — Progress Notes (Signed)
Subjective:    Patient ID: [name], male    DOB: April 08, 1945, 69 y.o.   MRN: [id number]  HPI  Mr. Verhagen is a 69 year old male who presents today with a chief complaint of cough and wheezing. He was evaluated on 12/29/14 with complaints of 5 day history of sneezing, productive cough (yellow mucous), mild dyspnea, fatigue, chest congestion. He was provided with a script for Zpak (to fill if no improvement as this was likely viral at the time) and hycodan cough syrup, He was encouraged to try mucinex and increase consumption of fluids.  He started the Zpak on 01/01/15 but only took 1 dose as he had full body pain after taking first dose. He continues to have nasal congestion and productive cough with yellow sputum. He's not feeling much improved overall. The Hycodan has helped with cough HS. He's not taken anything else OTC.  Review of Systems  Constitutional: Positive for fatigue. Negative for fever and chills.  HENT: Positive for congestion, postnasal drip and sore throat. Negative for ear pain and sinus pressure.   Respiratory: Positive for cough. Negative for shortness of breath.   Cardiovascular: Negative for chest pain.  Musculoskeletal: Negative for myalgias.       Past Medical History  Diagnosis Date  . Seronegative arthritis     RA, previously on MTX, RF <7, ANA nl (rheum Dr. Donnamarie Poag)  . Hematuria - cause not known     s/p urology workup 2010, nl CT, cystoscopy (doesn't want rpt), cytology  . History of chicken pox   . Depression with anxiety     h/o remote hospitalization for depression  . GERD (gastroesophageal reflux disease)   . History of colon polyps 2005    tubular adenoma, on rpt no more (2010)  . OSA on CPAP     cpap machine at 10cm, ambien, daytime drowsiness attributed to OSA, last changed dextroamphetamine to nuvigil, previously tried provigil  . Torn rotator cuff     bilateral, s/p L repair and now recurrent, chronic massive R RTC tear (supraspinatus)  .  Chronic lower back pain     DDD, prior on lyrica, gabapentin, lumbar stenosis with persistent numbness R ant thigh, ESI didn't help, now seeing [facility]  . BPH (benign prostatic hypertrophy)     h/o prostate nodule, followed by Dr. Risa Grill yearly  . Insomnia     Dohmeier  . Cervical neck pain with evidence of disc disease     h/o bulging discs  . Prostate nodule     followed by Risa Grill, reassuring exam and PSA    Social History   Social History  . Marital Status: Married    Spouse Name: [name]  . Number of Children: 2  . Years of Education: Masters +   Occupational History  . Clergyman Other    [employer]   Social History Main Topics  . Smoking status: Never Smoker   . Smokeless tobacco: Never Used  . Alcohol Use: No  . Drug Use: No  . Sexual Activity: Not on file   Other Topics Concern  . Not on file   Social History Narrative   Caffeine: avoids   Patient is married [name]) Lives with wife - who is in a power chair.   Occupation: Scientist, product/process development, Oceanographer   Edu: masters +   Likes to travel   Activity: no regular activity, to join Y   Diet: some fruits/vegetables   Patient has two children.  Patient is left-handed.    Past Surgical History  Procedure Laterality Date  . Achilles tendon repair  2002  . Cataract extraction  2007    left  . Rotator cuff repair  2008    Left (Dr. Gaylyn Rong in [place], now [place])  . Lumbar disc surgery  07/2010    L3/4 diskectomy (Dr. Harl Bowie at [facility])  . Colonoscopy  11/2008    diverticulosis, rpt 5 yrs  . Mri thoracic  09/2010    thoracic DDD/kyphosis, minimal anterolisthesis T2/3 with B foraminal narrowing  . Upper gastrointestinal endoscopy  02/2005    minimal GERD, small gastric polyp, tiny HH  . Dobutamine stress echo  08/2006    negative for ischemia  . Colonoscopy  07/2014    2 TA, mild diverticulosis, rpt 5 yrs [name])    Family History  Problem Relation Age of Onset  . Arthritis Mother   . COPD  Father     smoker  . Cancer Neg Hx   . Stroke Neg Hx   . Coronary artery disease Neg Hx   . Diabetes Neg Hx     Allergies  Allergen Reactions  . Amoxicillin Nausea And Vomiting  . Erythromycin Nausea And Vomiting  . Sulfa Drugs Cross Reactors Other (See Comments)    Thrush    Current Outpatient Prescriptions on File Prior to Visit  Medication Sig Dispense Refill  . acetaminophen (TYLENOL) 500 MG tablet Take 500 mg by mouth every 6 (six) hours as needed.    Marland Kitchen buPROPion (WELLBUTRIN XL) 300 MG 24 hr tablet Take one tablet daily **MUST HAVE PHYSICAL FOR FURTHER REFILLS** 90 tablet 0  . HYDROCODONE-CHLORPHENIRAMINE PO Take by mouth as needed.    Marland Kitchen HYDROcodone-homatropine (HYCODAN) 5-1.5 MG/5ML syrup Take 5 mLs by mouth at bedtime as needed for cough (sedation precautions). 140 mL 0  . Melatonin 5 MG CAPS Take 5 mg by mouth at bedtime as needed.    . meloxicam (MOBIC) 7.5 MG tablet Take 1 tablet (7.5 mg total) by mouth daily. (Patient taking differently: Take 7.5 mg by mouth daily as needed. ) 30 tablet 3  . methocarbamol (ROBAXIN) 750 MG tablet Take 1 tablet (750 mg total) by mouth 3 (three) times daily as needed. 270 tablet 3  . modafinil (PROVIGIL) 100 MG tablet Take 1 tablet (100 mg total) by mouth 2 (two) times daily. 180 tablet 3  . omeprazole (PRILOSEC) 20 MG capsule TAKE 1 CAPSULE DAILY 90 capsule 1  . tamsulosin (FLOMAX) 0.4 MG CAPS capsule 1 capsule daily.    Marland Kitchen zolpidem (AMBIEN) 10 MG tablet Take 0.5 tablets (5 mg total) by mouth at bedtime as needed for sleep. 45 tablet 1   No current facility-administered medications on file prior to visit.    BP 126/88 mmHg  Pulse 69  Temp(Src) 98.1 F (36.7 C) (Oral)  Ht 5\' 7"  (1.702 m)  Wt 218 lb 12.8 oz (99.247 kg)  BMI 34.26 kg/m2  SpO2 97%    Objective:   Physical Exam  Constitutional: He appears well-nourished.  HENT:  Right Ear: Tympanic membrane and ear canal normal.  Left Ear: Tympanic membrane and ear canal normal.    Nose: Mucosal edema present. Right sinus exhibits no maxillary sinus tenderness and no frontal sinus tenderness. Left sinus exhibits no maxillary sinus tenderness and no frontal sinus tenderness.  Mouth/Throat: Oropharynx is clear and moist.  Eyes: Conjunctivae are normal. Pupils are equal, round, and reactive to light.  Neck: Neck supple.  Cardiovascular: Normal  rate and regular rhythm.   Pulmonary/Chest: Effort normal. He has rales in the right upper field, the right lower field, the left upper field and the left lower field.  Lymphadenopathy:    He has no cervical adenopathy.  Skin: Skin is warm and dry.          Assessment & Plan:  Acute Bronchitis:  Cough with yellow/green mucous, chest congestion, fatigue continues. No sinus pressure or fevers. Took 1 dose of Zpak on 11/24 and stopped as he felt widespread body aches. Exam with rhonchi throughout lung fields, no diminished sounds. Will treat with levaquin to treat probable bacterial infection and prevent pneumonia. Fluids, rest, continue Hycodan. Follow up if no improvement.

## 2015-01-05 NOTE — Progress Notes (Signed)
Pre visit review using our clinic review tool, if applicable. No additional management support is needed unless otherwise documented below in the visit note. 

## 2015-01-05 NOTE — Patient Instructions (Signed)
Start levofloxacin antibiotics. Take 1 tablet by mouth daily for 7 days.  Continue Mucinex DM to help reduce chest congestion.  Continue cough syrup at bedtime to help with cough and rest.  Please call us if no improvement in symptoms in the next 3-4 days.  It was a pleasure meeting you!  Acute Bronchitis Bronchitis is inflammation of the airways that extend from the windpipe into the lungs (bronchi). The inflammation often causes mucus to develop. This leads to a cough, which is the most common symptom of bronchitis.  In acute bronchitis, the condition usually develops suddenly and goes away over time, usually in a couple weeks. Smoking, allergies, and asthma can make bronchitis worse. Repeated episodes of bronchitis may cause further lung problems.  CAUSES Acute bronchitis is most often caused by the same virus that causes a cold. The virus can spread from person to person (contagious) through coughing, sneezing, and touching contaminated objects. SIGNS AND SYMPTOMS   Cough.   Fever.   Coughing up mucus.   Body aches.   Chest congestion.   Chills.   Shortness of breath.   Sore throat.  DIAGNOSIS  Acute bronchitis is usually diagnosed through a physical exam. Your health care provider will also ask you questions about your medical history. Tests, such as chest X-rays, are sometimes done to rule out other conditions.  TREATMENT  Acute bronchitis usually goes away in a couple weeks. Oftentimes, no medical treatment is necessary. Medicines are sometimes given for relief of fever or cough. Antibiotic medicines are usually not needed but may be prescribed in certain situations. In some cases, an inhaler may be recommended to help reduce shortness of breath and control the cough. A cool mist vaporizer may also be used to help thin bronchial secretions and make it easier to clear the chest.  HOME CARE INSTRUCTIONS  Get plenty of rest.   Drink enough fluids to keep your  urine clear or pale yellow (unless you have a medical condition that requires fluid restriction). Increasing fluids may help thin your respiratory secretions (sputum) and reduce chest congestion, and it will prevent dehydration.   Take medicines only as directed by your health care provider.  If you were prescribed an antibiotic medicine, finish it all even if you start to feel better.  Avoid smoking and secondhand smoke. Exposure to cigarette smoke or irritating chemicals will make bronchitis worse. If you are a smoker, consider using nicotine gum or skin patches to help control withdrawal symptoms. Quitting smoking will help your lungs heal faster.   Reduce the chances of another bout of acute bronchitis by washing your hands frequently, avoiding people with cold symptoms, and trying not to touch your hands to your mouth, nose, or eyes.   Keep all follow-up visits as directed by your health care provider.  SEEK MEDICAL CARE IF: Your symptoms do not improve after 1 week of treatment.  SEEK IMMEDIATE MEDICAL CARE IF:  You develop an increased fever or chills.   You have chest pain.   You have severe shortness of breath.  You have bloody sputum.   You develop dehydration.  You faint or repeatedly feel like you are going to pass out.  You develop repeated vomiting.  You develop a severe headache. MAKE SURE YOU:   Understand these instructions.  Will watch your condition.  Will get help right away if you are not doing well or get worse.   This information is not intended to replace advice given to you  by your health care provider. Make sure you discuss any questions you have with your health care provider.   Document Released: 03/03/2004 Document Revised: 02/14/2014 Document Reviewed: 07/17/2012 Elsevier Interactive Patient Education [employer].

## 2015-01-06 ENCOUNTER — Encounter: Payer: Self-pay | Admitting: Neurology

## 2015-01-06 ENCOUNTER — Ambulatory Visit (INDEPENDENT_AMBULATORY_CARE_PROVIDER_SITE_OTHER): Payer: Medicare Other | Admitting: Neurology

## 2015-01-06 VITALS — BP 140/72 | HR 70 | Resp 18 | Ht 67.0 in | Wt 218.0 lb

## 2015-01-06 DIAGNOSIS — G4719 Other hypersomnia: Secondary | ICD-10-CM | POA: Diagnosis not present

## 2015-01-06 DIAGNOSIS — Z9989 Dependence on other enabling machines and devices: Secondary | ICD-10-CM

## 2015-01-06 DIAGNOSIS — G4733 Obstructive sleep apnea (adult) (pediatric): Secondary | ICD-10-CM

## 2015-01-06 DIAGNOSIS — E669 Obesity, unspecified: Secondary | ICD-10-CM | POA: Diagnosis not present

## 2015-01-06 NOTE — Patient Instructions (Signed)
Please continue using your CPAP regularly. While your insurance requires that you use CPAP at least 4 hours each night on 70% of the nights, I recommend, that you not skip any nights and use it throughout the night if you can. Getting used to CPAP and staying with the treatment long term does take time and patience and discipline. Untreated obstructive sleep apnea when it is moderate to severe can have an adverse impact on cardiovascular health and raise her risk for heart disease, arrhythmias, hypertension, congestive heart failure, stroke and diabetes. Untreated obstructive sleep apnea causes sleep disruption, nonrestorative sleep, and sleep deprivation. This can have an impact on your day to day functioning and cause daytime sleepiness and impairment of cognitive function, memory loss, mood disturbance, and problems focussing. Using CPAP regularly can improve these symptoms.  Keep up the good work! I will see you back in 6 months for sleep apnea check up. You can continue with modafinil 100 mg 1-2 times a day.

## 2015-01-06 NOTE — Progress Notes (Signed)
Subjective:    Patient ID: Gabriel [name] is a 69 y.o. male.  HPI     Interim history:   Gabriel [name] is a very friendly 69 year old left-handed woman with an underlying medical history of arthritis of the left shoulder, degenerative disc disease, cervical radiculopathy, depression with anxiety, reflux disease, obstructive sleep apnea, who presents for follow-up consultation of Gabriel [name] OSA on CPAP with residual EDS. Gabriel [name] is unaccompanied today. Of note, the patient no showed for an appointment on 12/24/2014 secondary to sickness. I last saw him on 06/19/2014, at which time Gabriel [name] reported compliance with treatment. Gabriel [name] was on 5 mg of Ambien each night and 5 mg of melatonin each night. Modafinil was marginally helpful. Gabriel [name] usually takes 1 in the morning and sometimes a second one in the afternoon. 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 Gabriel [name] reported bilateral shoulder pain. Gabriel [name] had to do a lot of lifting at home. Gabriel [name] wife also needs help at night. This disrupts Gabriel [name] sleep.   Today, 01/06/15: I reviewed Gabriel [name] CPAP compliance data from 11/23/2014 through 12/22/2014 which is a total of 30 days during which time Gabriel [name] used Gabriel [name] machine every night with percent used days greater than 4 hours at 97%, indicating excellent compliance with an average usage of 6 hours and 8 minutes, residual AHI low at 1.1 per hour, leak acceptable with the 95th percentile at 16.2 L/m on a pressure of 11 cm with EPR of 2.  Today, 01/06/2015: Gabriel [name] reports feeling fairly stable as far as Gabriel [name] sleepiness and Gabriel [name] sleep apnea is concerned. Gabriel [name] does admit that Gabriel [name] has not had a mask replacement in several months. Gabriel [name] has also not changed Gabriel [name] filter in several months. Gabriel [name] filter does look like it needs replacement. In the interim, Gabriel [name] had cold symptoms and then developed bronchitis. Gabriel [name] has been on Levaquin for the past couple of days for a total of one week. Gabriel [name] feels improved. Gabriel [name] still has some  congestion and cough. Gabriel [name] has ongoing stress. Gabriel [name] feels that Gabriel [name] will retire sooner than Gabriel [name] expected. Gabriel [name] continues to take generic modafinil 100 mg 1-2 times daily.  Previously:   I saw him on 12/18/2013, at which time Gabriel [name] reported having trouble sleeping at night. Gabriel [name] felt sleepy during the day. Gabriel [name] was on Provigil 100 mg once daily. Gabriel [name] had more stress and more back pain. Gabriel [name] was worried about Gabriel [name] wife's health.  I reviewed Gabriel [name] CPAP compliance data from 05/18/2014 through 06/16/2014 which is a total of 30 days during which time Gabriel [name] used Gabriel [name] machine every night with percent used days greater than 4 hours at 100%, indicating superb compliance with an average usage of 6 hours and 33 minutes, residual AHI low at 1.2 per hour, leak however high, with the 95th percentile at 40.2 L/m on a pressure of 11 cm with EPR of 2.   I saw him on 07/02/2013, at which time I suggested Gabriel [name] return for a full night CPAP study followed by a nap study the next day. Gabriel [name] was advised to taper off Gabriel [name] nortriptyline and Robaxin as well as stop Gabriel [name] Provigil 7-10 days prior to sleep testing. Gabriel [name] had a CPAP study on 08/27/2013. Sleep efficiency was 86.9%. Latency to sleep was 3.5 minutes. Wake after sleep onset was 55.5 minutes with moderate to severe sleep fragmentation noted. Gabriel [name] had an elevated arousal index at 15.2 per hour. Gabriel [name] had an increased percentage of stage 1 sleep  and a mildly increased percentage of REM sleep at 26.1% with a mildly prolonged REM latency of 130.5 minutes. Gabriel [name] had frequent PVCs. Gabriel [name] was on CPAP at the usual pressure of 10 cm which was then increased to 11 and 12 cm. I felt Gabriel [name] did a little bit better on 11 cm and since it was not a big change from Gabriel [name] baseline treatment pressure we proceeded with the next day nap study. Gabriel [name] had an MSLT on 08/28/2013 with 5. Mean sleep latency was 10.4 with no REM onset naps. We called him with Gabriel [name] test results on the phone. I did make an adjustment to Gabriel [name] CPAP pressure to 11 cm.  I reviewed  Gabriel [name] compliance data from 11/16/2013 through 12/15/2013 which is a total of 30 days during which time Gabriel [name] used Gabriel [name] CPAP every night with percent used days greater than 4 hours of 97%, indicating excellent compliance, average usage of 6 hours and 17 minutes. Residual AHI at 1.2 per hour, leak at times was high with the 95th percentile at 47.3 L/m. Pressure at 11 cm with EPR of 2.  I saw him on 01/14/13, at which time we talked about Gabriel [name] sleep study results and Gabriel [name] CPAP   compliance which was good. Gabriel [name] was well treated in terms of reduction of Gabriel [name] residual AHI. I suggested Gabriel [name] try Nuvigil as Gabriel [name] had some side effects in the past with Provigil. We also talked about potentially bringing him back for further testing with MSLT down the [place]. In the interim, Gabriel [name] was seen by our NP, Ms. Lam, on 04/17/2013, at which time Gabriel [name] reported that Gabriel [name] could not afford Nuvigil and we decided to prescribe Provigil again. Gabriel [name] requested to try it again. We also scheduled him for a repeat sleep study followed by a nap study to further delineate and quantify Gabriel [name] sleepiness. I asked him to taper off sedating medications with the help of Gabriel [name] primary care physician and also discontinue Gabriel [name] Provigil 7-10 days prior to the sleep studies. Gabriel [name] was given instructions by Gabriel [name] primary care physician to come off of Gabriel [name] muscle relaxer and nortriptyline. Gabriel [name] did not have Gabriel [name] sleep studies done and called to cancel those. I reviewed Gabriel [name] CPAP compliance down from 03/18/2013 through 04/16/2013 which is a total of 30 days during which time Gabriel [name] uses CPAP every night with 80% used days greater than 4 hours at 100%, indicating superb compliance with an average usage of 7 hours. Residual AHI was acceptable at 2.7 per hour and leak was acceptable at 20.1 L per minute at the 95th percentile.  I reviewed Gabriel [name] compliance data from 06/02/2013 through 07/01/2013 which is the last 30 days during which time Gabriel [name] was 100% compliant. Average usage was 6 hours and 39 minutes.  Residual AHI low at 1 per hour, leak was a time high with the 95th percentile at 34.7 L per minute, pressure at 10 cm with EPR of 2.   I first met him on 10/16/12, at which time, Gabriel [name] reported problems initiating and maintaining sleep and ongoing severe EDS. Gabriel [name] has been taking Pamelor at bedtime (previously Elavil, but had to stop because of severe mouth dryness) as well as Ambien 5 mg at bedtime and has been on it for about 25 years. I recommended re-evaluation of Gabriel [name] OSA first with a split night sleep study. Gabriel [name] has this test on 11/21/2012 and I went over Gabriel [name] sleep study report in detail with him previously: Gabriel [name] baseline sleep efficiency was reduced at 60.5% with  a latency to sleep of 14 minutes and wake after sleep onset of 36 minutes with moderate to severe sleep fragmentation noted. Gabriel [name] had a markedly elevated arousal index at 96.5 per hour. There was a markedly increased percentage of stage I sleep, reduced percentage of stage II sleep, and absence of slow-wave sleep and REM sleep prior to CPAP. Gabriel [name] had periodic leg movements of 5.5 per hour. Gabriel [name] had frequent, unifocal-appearing PVCs. Gabriel [name] had mild to moderate snoring. Gabriel [name] AHI was highly elevated at 83.9 per hour. Gabriel [name] baseline oxygen saturation was 93%, Gabriel [name] nadir was 84% in non-REM sleep. Gabriel [name] was then titrated on CPAP. Gabriel [name] sleep efficiency was significantly increased and Gabriel [name] arousal index improved. Gabriel [name] had absence of slow wave sleep and 12.7% of REM sleep. Gabriel [name] average oxygen saturation was 93%, Gabriel [name] nadir was 82%. Gabriel [name] was titrated from 5-11 cm of water pressure and Gabriel [name] AHI was reduced to 0 events per hour at the pressure of 10. I also reviewed Gabriel [name] compliance data from Gabriel [name] machine: From 12/12/2012 through 01/13/2013 which is a total of 33 days during which she used CPAP every day. Gabriel [name] average usage was 6 hours and 44 minutes. Gabriel [name] residual AHI was 1.7 per hour on the pressure of 10 cm with EPR of 2. Gabriel [name] had a fairly high leak at times however.   Gabriel [name] is feeling better than  before the sleep study. Gabriel [name] is compliant with treatment, likes the machine better and the mask as well. She feels, Gabriel [name] sleepiness is about the same, but Gabriel [name] feels better. Gabriel [name] may take a nap without CPAP. Gabriel [name] does not keep a very good sleep schedule. Gabriel [name] uses the bathroom about once per night.   Gabriel [name] was previously diagnosed with OSA in 1998 and was prescribed CPAP of 12 cm it indicated full compliance. I reviewed Gabriel [name] CPAP titration study from 01/24/2010 last time. Gabriel [name] AHI was reduced to 0 per hour and O2 nadir was 91% for the night. REM latency of 49 minutes, but this was a titration study, and could have shown REM rebound.   Gabriel [name] was tried on Provigil years ago, and it did not help and Gabriel [name] felt nervous and irritable on it. Gabriel [name] denies cataplexy, or hypogogic or hypnopompic hallucinations, but has had sleep paralysis. Gabriel [name] had an MSLT about 10 years ago, which showed no evidence of narcolepsy per patient.    Gabriel [name] Past Medical History Is Significant For: Past Medical History  Diagnosis Date  . Seronegative arthritis     RA, previously on MTX, RF <7, ANA nl (rheum Dr. Donnamarie Poag)  . Hematuria - cause not known     s/p urology workup 2010, nl CT, cystoscopy (doesn't want rpt), cytology  . History of chicken pox   . Depression with anxiety     h/o remote hospitalization for depression  . GERD (gastroesophageal reflux disease)   . History of colon polyps 2005    tubular adenoma, on rpt no more (2010)  . OSA on CPAP     cpap machine at 10cm, ambien, daytime drowsiness attributed to OSA, last changed dextroamphetamine to nuvigil, previously tried provigil  . Torn rotator cuff     bilateral, s/p L repair and now recurrent, chronic massive R RTC tear (supraspinatus)  . Chronic lower back pain     DDD, prior on lyrica, gabapentin, lumbar stenosis with persistent numbness R ant thigh, ESI didn't help, now seeing [facility]  . BPH (benign prostatic hypertrophy)     h/o prostate nodule, followed by Dr.  Grapey yearly  . Insomnia      D[place]meier  . Cervical neck pain with evidence of disc disease     h/o bulging discs  . Prostate nodule     followed by Risa Grill, reassuring exam and PSA    Gabriel [name] Past Surgical History Is Significant For: Past Surgical History  Procedure Laterality Date  . Achilles tendon repair  2002  . Cataract extraction  2007    left  . Rotator cuff repair  2008    Left (Dr. Gaylyn Rong in [place], now [place])  . Lumbar disc surgery  07/2010    L3/4 diskectomy (Dr. Harl Bowie at [facility])  . Colonoscopy  11/2008    diverticulosis, rpt 5 yrs  . Mri thoracic  09/2010    thoracic DDD/kyphosis, minimal anterolisthesis T2/3 with B foraminal narrowing  . Upper gastrointestinal endoscopy  02/2005    minimal GERD, small gastric polyp, tiny HH  . Dobutamine stress echo  08/2006    negative for ischemia  . Colonoscopy  07/2014    2 TA, mild diverticulosis, rpt 5 yrs [name])    Gabriel [name] Family History Is Significant For: Family History  Problem Relation Age of Onset  . Arthritis Mother   . COPD Father     smoker  . Cancer Neg Hx   . Stroke Neg Hx   . Coronary artery disease Neg Hx   . Diabetes Neg Hx     Gabriel [name] Social History Is Significant For: Social History   Social History  . Marital Status: Married    Spouse Name: [name]  . Number of Children: 2  . Years of Education: Masters +   Occupational History  . Clergyman Other    [employer]   Social History Main Topics  . Smoking status: Never Smoker   . Smokeless tobacco: Never Used  . Alcohol Use: No  . Drug Use: No  . Sexual Activity: Not Asked   Other Topics Concern  . None   Social History Narrative   Caffeine: avoids   Patient is married [name]) Lives with wife - who is in a power chair.   Occupation: Scientist, product/process development, Oceanographer   Edu: masters +   Likes to travel   Activity: no regular activity, to join Y   Diet: some fruits/vegetables   Patient has two children.   Patient is left-handed.    Gabriel [name] Allergies Are:   Allergies  Allergen Reactions  . Amoxicillin Nausea And Vomiting  . Azithromycin Other (See Comments)    Wide spread body aches  . Erythromycin Nausea And Vomiting  . Sulfa Drugs Cross Reactors Other (See Comments)    Thrush  :   Gabriel [name] Current Medications Are:  Outpatient Encounter Prescriptions as of 01/06/2015  Medication Sig  . acetaminophen (TYLENOL) 500 MG tablet Take 500 mg by mouth every 6 (six) hours as needed.  Marland Kitchen azithromycin (ZITHROMAX) 250 MG tablet TAKE 2 TABLETS BY MOUTH ON DAY 1, THEN 1 TABLET BY MOUTH DAILY ON DAYS 2-5  . buPROPion (WELLBUTRIN XL) 300 MG 24 hr tablet Take one tablet daily **MUST HAVE PHYSICAL FOR FURTHER REFILLS**  . HYDROCODONE-CHLORPHENIRAMINE PO Take by mouth as needed.  Marland Kitchen HYDROcodone-homatropine (HYCODAN) 5-1.5 MG/5ML syrup Take 5 mLs by mouth at bedtime as needed for cough (sedation precautions).  Marland Kitchen levofloxacin (LEVAQUIN) 500 MG tablet Take 1 tablet (500 mg total) by mouth daily.  . Melatonin 5 MG CAPS Take 5 mg by mouth at bedtime as needed.  . meloxicam (MOBIC) 7.5  MG tablet Take 1 tablet (7.5 mg total) by mouth daily. (Patient taking differently: Take 7.5 mg by mouth daily as needed. )  . methocarbamol (ROBAXIN) 750 MG tablet Take 1 tablet (750 mg total) by mouth 3 (three) times daily as needed.  . modafinil (PROVIGIL) 100 MG tablet Take 1 tablet (100 mg total) by mouth 2 (two) times daily.  Marland Kitchen omeprazole (PRILOSEC) 20 MG capsule TAKE 1 CAPSULE DAILY  . tamsulosin (FLOMAX) 0.4 MG CAPS capsule 1 capsule daily.  Marland Kitchen zolpidem (AMBIEN) 10 MG tablet Take 0.5 tablets (5 mg total) by mouth at bedtime as needed for sleep.   No facility-administered encounter medications on file as of 01/06/2015.  :  Review of Systems:  Out of a complete 14 point review of systems, all are reviewed and negative with the exception of these symptoms as listed below:  Review of Systems  Neurological:       Patient is here for f/u. No new concerns. Gabriel [name] is interested to know  if download shows any leaks.     Objective:  Neurologic Exam  Physical Exam Physical Examination:   Filed Vitals:   01/06/15 1610  BP: 140/72  Pulse: 70  Resp: 18   General Examination: The patient is a very pleasant 69 y.o. male in no acute distress. Gabriel [name] appears well-developed and well-nourished and very well groomed. Gabriel [name] is in good spirits today. Gabriel [name] speech is less pressured today.   HEENT: Normocephalic, atraumatic, pupils are equal, round and reactive to light and accommodation. Funduscopic exam is normal with sharp disc margins noted. Extraocular tracking is good without limitation to gaze excursion or nystagmus noted. Normal smooth pursuit is noted. Hearing is grossly intact. Face is symmetric with normal facial animation and normal facial sensation. Speech is clear with no dysarthria noted. There is no hypophonia. There is no lip, neck/head, jaw or voice tremor. Neck is supple with full range of passive and active motion. There are no carotid bruits on auscultation. Oropharynx exam reveals: mild mouth dryness, adequate dental hygiene and moderate airway crowding, due to narrow airway entry and larger tongue. Mallampati is class III. Tongue protrudes centrally and palate elevates symmetrically. Tonsils are 1+.    Chest: Clear to auscultation without wheezing, rhonchi or crackles noted.  Heart: S1+S2+0, regular and normal without murmurs, rubs or gallops noted.   Abdomen: Soft, non-tender and non-distended with normal bowel sounds appreciated on auscultation.  Extremities: There is no pitting edema in the distal lower extremities bilaterally. Pedal pulses are intact.  Skin: Warm and dry without trophic changes noted. There are mild varicose veins in the right distal leg.  Musculoskeletal: exam reveals no obvious joint deformities, tenderness or joint swelling or erythema but Gabriel [name] has pain and some decrease in range of motion in both shoulders, unchanged.   Neurologically:  Mental  status: The patient is awake, alert and oriented in all 4 spheres. Gabriel [name] memory, attention, language and knowledge are appropriate. There is no aphasia, agnosia, apraxia or anomia. Speech is clear with normal prosody and enunciation. Thought process is linear. Mood is congruent and affect is normal.  Cranial nerves are as described above under HEENT exam. In addition, shoulder shrug is normal with equal shoulder height noted. Motor exam: Normal bulk, strength and tone is noted. There is no drift, tremor or rebound. Romberg is negative, except for slight sway. Reflexes are 2+ throughout. Fine motor skills are intact with normal finger taps, normal hand movements, normal rapid alternating patting, normal foot taps and  normal foot agility.  Cerebellar testing shows no dysmetria or intention tremor on finger to nose testing. Heel to shin is unremarkable bilaterally. There is no truncal or gait ataxia.  Sensory exam is intact to light touch in the upper and lower extremities.  Gait, station and balance are unremarkable. No veering to one side is noted. No leaning to one side is noted. Posture is age-appropriate and stance is narrow based. No problems turning are noted. Gabriel [name] turns en bloc. Tandem walk is slightly difficult for him today.          Assessment and Plan:   In summary, OBE AHLERS is a very pleasant 69 year old male with an underlying medical history of arthritis of the left shoulder, degenerative disc disease, cervical radiculopathy, depression with anxiety, and reflux disease, who presents for follow up consultation of Gabriel [name] obstructive sleep apnea with residual EDS. Gabriel [name] is on CPAP with full compliance and has always been fully compliant with treatment. Provigil has helped Gabriel [name] daytime somnolence  to some degree. Gabriel [name] mask leak is acceptable or borderline. Gabriel [name] does need new supplies including a new filter new mask replacement. I placed an order for this. Gabriel [name] did not need a refill on Gabriel [name] modafinil today. Gabriel [name]  is advised to continue with this, 1 pill in the morning and a second pill if needed around lunchtime or in the afternoon. Gabriel [name] can continue using melatonin at night. Gabriel [name] exam is stable. I suggested a 6 month follow-up, sooner if needed. I answered all Gabriel [name] questions today and the patient was in agreement. I spent 20 minutes in total face-to-face time with the patient, more than 50% of which was spent in counseling and coordination of care, reviewing test results, reviewing medication and discussing or reviewing the diagnosis of sleepiness, and OSA, the prognosis and treatment options.

## 2015-01-12 DIAGNOSIS — M545 Low back pain: Secondary | ICD-10-CM | POA: Diagnosis not present

## 2015-01-12 DIAGNOSIS — M25561 Pain in right knee: Secondary | ICD-10-CM | POA: Diagnosis not present

## 2015-01-15 ENCOUNTER — Telehealth: Payer: Self-pay

## 2015-01-15 NOTE — Telephone Encounter (Signed)
Patient called back and notified patient of Kate's comments. Patient verbalized understanding.

## 2015-01-15 NOTE — Telephone Encounter (Signed)
Message left for patient to return my call.  

## 2015-01-15 NOTE — Telephone Encounter (Signed)
I highly doubt his symptoms are related to bacterial infection as he was treated with Levaquin recently. He should try taking a daily antihistamine such as Claritin or zyrtec for his current symptoms, this may help. If the white substance on his tongue does not resolve with the nystatin, he should be evaluated by PCP.  I hope this helps!

## 2015-01-15 NOTE — Telephone Encounter (Signed)
Pt left v/m; pt last seen 01/05/15; pt thought was over bronchitis episode from end of Nov but pt has started blowing his nose more and sneezing; last night food tasted "funny" and pt looked at tongue and top of tongue was white; pt used Nystatin oral suspension one tsp last night that already had at home. Pt request cb with further instructions.[facility] [address]

## 2015-01-29 ENCOUNTER — Other Ambulatory Visit: Payer: Self-pay | Admitting: *Deleted

## 2015-01-29 MED ORDER — BUPROPION HCL ER (XL) 300 MG PO TB24: ORAL_TABLET | ORAL | Status: DC

## 2015-02-07 DIAGNOSIS — M25561 Pain in right knee: Secondary | ICD-10-CM | POA: Diagnosis not present

## 2015-02-10 ENCOUNTER — Other Ambulatory Visit: Payer: Self-pay

## 2015-02-10 ENCOUNTER — Telehealth: Payer: Self-pay

## 2015-02-10 DIAGNOSIS — G4733 Obstructive sleep apnea (adult) (pediatric): Secondary | ICD-10-CM

## 2015-02-10 DIAGNOSIS — G4719 Other hypersomnia: Secondary | ICD-10-CM

## 2015-02-10 DIAGNOSIS — Z9989 Dependence on other enabling machines and devices: Secondary | ICD-10-CM

## 2015-02-10 MED ORDER — MODAFINIL 100 MG PO TABS: 100.0000 mg | ORAL_TABLET | Freq: Two times a day (BID) | ORAL | Status: DC

## 2015-02-10 NOTE — Telephone Encounter (Signed)
Left message that his Rx (Provigil) is ready to p/u at our front desk

## 2015-02-11 NOTE — Telephone Encounter (Signed)
I got the Rx back and faxed to [facility]

## 2015-02-11 NOTE — Telephone Encounter (Signed)
Pt is requesting that his rx be faxed to [facility]

## 2015-02-18 DIAGNOSIS — S83241A Other tear of medial meniscus, current injury, right knee, initial encounter: Secondary | ICD-10-CM | POA: Diagnosis not present

## 2015-02-23 ENCOUNTER — Other Ambulatory Visit: Payer: Self-pay | Admitting: Family Medicine

## 2015-02-23 NOTE — Telephone Encounter (Signed)
Ok to refill 

## 2015-03-18 DIAGNOSIS — S83241D Other tear of medial meniscus, current injury, right knee, subsequent encounter: Secondary | ICD-10-CM | POA: Diagnosis not present

## 2015-04-06 ENCOUNTER — Ambulatory Visit (INDEPENDENT_AMBULATORY_CARE_PROVIDER_SITE_OTHER): Payer: Medicare Other | Admitting: Internal Medicine

## 2015-04-06 ENCOUNTER — Encounter: Payer: Self-pay | Admitting: Internal Medicine

## 2015-04-06 VITALS — BP 134/82 | HR 70 | Temp 98.6°F | Wt 213.0 lb

## 2015-04-06 DIAGNOSIS — J0100 Acute maxillary sinusitis, unspecified: Secondary | ICD-10-CM

## 2015-04-06 MED ORDER — DOXYCYCLINE HYCLATE 100 MG PO TABS: 100.0000 mg | ORAL_TABLET | Freq: Two times a day (BID) | ORAL | Status: DC

## 2015-04-06 NOTE — Patient Instructions (Signed)

## 2015-04-06 NOTE — Progress Notes (Signed)
Pre visit review using our clinic review tool, if applicable. No additional management support is needed unless otherwise documented below in the visit note. 

## 2015-04-06 NOTE — Progress Notes (Signed)
HPI  Pt presents to the clinic today with c/o sneezing, nasal congestion, sore throat and cough. This started 1 week ago. He is blowing yellow mucous out of his nose. The cough is productive of yellow mucous. He denies fever, chills or body aches. He has tried Mucinex with minimal relief. He has no history of seasonal allergies. He does have OSA. He has had sick contacts.  Review of Systems    Past Medical History  Diagnosis Date  . Seronegative arthritis     RA, previously on MTX, RF <7, ANA nl (rheum Dr. Donnamarie Poag)  . Hematuria - cause not known     s/p urology workup 2010, nl CT, cystoscopy (doesn't want rpt), cytology  . History of chicken pox   . Depression with anxiety     h/o remote hospitalization for depression  . GERD (gastroesophageal reflux disease)   . History of colon polyps 2005    tubular adenoma, on rpt no more (2010)  . OSA on CPAP     cpap machine at 10cm, ambien, daytime drowsiness attributed to OSA, last changed dextroamphetamine to nuvigil, previously tried provigil  . Torn rotator cuff     bilateral, s/p L repair and now recurrent, chronic massive R RTC tear (supraspinatus)  . Chronic lower back pain     DDD, prior on lyrica, gabapentin, lumbar stenosis with persistent numbness R ant thigh, ESI didn't help, now seeing Branch  . BPH (benign prostatic hypertrophy)     h/o prostate nodule, followed by Dr. Risa Grill yearly  . Insomnia     Dohmeier  . Cervical neck pain with evidence of disc disease     h/o bulging discs  . Prostate nodule     followed by Risa Grill, reassuring exam and PSA    Family History  Problem Relation Age of Onset  . Arthritis Mother   . COPD Father     smoker  . Cancer Neg Hx   . Stroke Neg Hx   . Coronary artery disease Neg Hx   . Diabetes Neg Hx     Social History   Social History  . Marital Status: Married    Spouse Name: [name]  . Number of Children: 2  . Years of Education: Masters +   Occupational History  . Clergyman Other     [employer]   Social History Main Topics  . Smoking status: Never Smoker   . Smokeless tobacco: Never Used  . Alcohol Use: No  . Drug Use: No  . Sexual Activity: Not on file   Other Topics Concern  . Not on file   Social History Narrative   Caffeine: avoids   Patient is married [name]) Lives with wife - who is in a power chair.   Occupation: Scientist, product/process development, Oceanographer   Edu: masters +   Likes to travel   Activity: no regular activity, to join Y   Diet: some fruits/vegetables   Patient has two children.   Patient is left-handed.    Allergies  Allergen Reactions  . Amoxicillin Nausea And Vomiting  . Azithromycin Other (See Comments)    Wide spread body aches  . Erythromycin Nausea And Vomiting  . Sulfa Drugs Cross Reactors Other (See Comments)    Thrush     Constitutional: Positive headache. Denies fatigue, fever or  abrupt weight changes.  HEENT:  Positive nasal congestion and sore throat. Denies eye redness, ear pain, ringing in the ears, wax buildup, runny nose or bloody nose. Respiratory:  Positive cough. Denies difficulty breathing or shortness of breath.  Cardiovascular: Denies chest pain, chest tightness, palpitations or swelling in the hands or feet.   No other specific complaints in a complete review of systems (except as listed in HPI above).  Objective:   BP 134/82 mmHg  Pulse 70  Temp(Src) 98.6 F (37 C) (Oral)  Wt 213 lb (96.616 kg)  SpO2 98%  General: Appears his stated age,  in NAD. HEENT: Head: normal shape and size, maxillary sinus tenderness noted; Eyes: sclera white, no icterus, conjunctiva pink; Ears: Tm's gray and intact, normal light reflex; Nose: mucosa boggy and moist, septum midline; Throat/Mouth: + PND. Teeth present, mucosa pink and moist, no exudate noted, no lesions or ulcerations noted.  Neck:  No adenopathy noted.  Cardiovascular: Normal rate and rhythm. S1,S2 noted.  No murmur, rubs or gallops noted.   Pulmonary/Chest: Normal effort and positive vesicular breath sounds. No respiratory distress. No wheezes, rales or ronchi noted.      Assessment & Plan:   Acute bacterial sinusitis  Can use a Neti Pot which can be purchased from your local drug store. Flonase 2 sprays each nostril for 3 days and then as needed. Doxycycline BID for 10 days  RTC as needed or if symptoms persist.

## 2015-04-07 ENCOUNTER — Telehealth: Payer: Self-pay

## 2015-04-07 NOTE — Telephone Encounter (Signed)
Modafinil approved through [employer] form on CoverMyMeds. Aprroval received 04/07/2015. Confirmation number [id number]. Request [id number]

## 2015-04-30 ENCOUNTER — Ambulatory Visit (INDEPENDENT_AMBULATORY_CARE_PROVIDER_SITE_OTHER): Payer: Medicare Other | Admitting: Podiatry

## 2015-04-30 ENCOUNTER — Encounter: Payer: Self-pay | Admitting: Podiatry

## 2015-04-30 VITALS — BP 158/95 | HR 65 | Resp 16 | Ht 67.0 in | Wt 220.0 lb

## 2015-04-30 DIAGNOSIS — L600 Ingrowing nail: Secondary | ICD-10-CM

## 2015-04-30 DIAGNOSIS — B070 Plantar wart: Secondary | ICD-10-CM | POA: Diagnosis not present

## 2015-04-30 DIAGNOSIS — B078 Other viral warts: Secondary | ICD-10-CM

## 2015-04-30 DIAGNOSIS — B079 Viral wart, unspecified: Secondary | ICD-10-CM

## 2015-04-30 NOTE — Progress Notes (Signed)
   Subjective:    Patient ID: Gabriel [name], male    DOB: 04-12-1945, 70 y.o.   MRN: [id number]  HPI Patient presents with a nail problem on their Right foot; great toe-lateral side; pt stated, "needs nail to be checked for an ingrown toenail".  Patient also presents with a callous on their left foot; plantar forefoot-below 5th toe. Pt stated, "Have been using Equate Liquid Corn & Callous Remover".   Review of Systems  Constitutional: Positive for fatigue.  HENT: Positive for sinus pressure.   All other systems reviewed and are negative.      Objective:   Physical Exam        Assessment & Plan:

## 2015-05-01 NOTE — Addendum Note (Signed)
Addended by: Harriett Sine D on: 05/01/2015 08:27 AM   Modules accepted: Orders

## 2015-05-01 NOTE — Progress Notes (Signed)
Subjective:     Patient ID: Gabriel [name], male   DOB: [birth year], 70 y.o.   MRN: [id number]  HPI patient presents stating I have this painful ingrown toenail on my right big toe that I cannot trim and I've tried to soak it and I have this lesion on the bottom my left foot that use chemical on that's not been successful and it's very tender and is bothering me for around one year   Review of Systems  All other systems reviewed and are negative.      Objective:   Physical Exam  Constitutional: He is oriented to person, place, and time.  Cardiovascular: Intact distal pulses.   Musculoskeletal: Normal range of motion.  Neurological: He is oriented to person, place, and time.  Skin: Skin is warm and dry.  Nursing note and vitals reviewed.  neurovascular status intact muscle strength adequate range of motion within normal limits with patient found to have incurvated right hallux lateral border that's painful when pressed and also noted to have lesion underneath the fifth metatarsal left that upon debridement shows pinpoint bleeding and is painful to lateral pressure. Patient has good digital perfusion is well oriented 3 and has no drainage from the right hallux nail site     Assessment:     Toenail deformity right hallux with pain and probable verruca plantaris plantar aspect left injuring approximate 7 mm x 8 mm    Plan:     H&P and both conditions reviewed. Patient would like a definitive solution I recommended ingrown toenail removed and explained risk and excision of lesion left explaining risk. Patient wants surgery and today I infiltrated the right hallux 60 mg like Marcaine mixture and removed the lateral border exposing the matrix and applying phenol 3 applications 30 seconds followed by alcohol lavage and sterile dressing. For the left I went ahead and utilizing sterile technique I removed the mass in toto I took it to the epidermal dermal junction and placed in formalin for  pathological evaluation and applied a small amount of phenol to the base along with sterile dressing and reappoint to recheck

## 2015-05-04 ENCOUNTER — Ambulatory Visit (INDEPENDENT_AMBULATORY_CARE_PROVIDER_SITE_OTHER): Payer: Medicare Other

## 2015-05-04 ENCOUNTER — Ambulatory Visit (INDEPENDENT_AMBULATORY_CARE_PROVIDER_SITE_OTHER): Payer: Medicare Other | Admitting: Podiatry

## 2015-05-04 ENCOUNTER — Encounter: Payer: Self-pay | Admitting: Podiatry

## 2015-05-04 VITALS — Temp 97.3°F

## 2015-05-04 DIAGNOSIS — M79672 Pain in left foot: Secondary | ICD-10-CM

## 2015-05-04 DIAGNOSIS — L02619 Cutaneous abscess of unspecified foot: Secondary | ICD-10-CM

## 2015-05-04 DIAGNOSIS — L03119 Cellulitis of unspecified part of limb: Secondary | ICD-10-CM

## 2015-05-04 MED ORDER — AMOXICILLIN-POT CLAVULANATE 875-125 MG PO TABS: 1.0000 | ORAL_TABLET | Freq: Two times a day (BID) | ORAL | Status: DC

## 2015-05-05 NOTE — Progress Notes (Signed)
Subjective:     Patient ID: Gabriel [name], male   DOB: 09-14-1945, 70 y.o.   MRN: [id number]  HPI patient presents stating I developed a blister on the outside of my left foot over the last couple days after having my were removed and it's been painful and there is been red around the area. I'm not had any temperature or any  indications of systemic infection   Review of Systems     Objective:   Physical Exam Neurovascular status found to be intact muscle strength adequate with localized blister on the left fifth metatarsal with incision site that's healed excellent from previous wart removal with no odor noted no proximal edema erythema or drainage noted at the current time    Assessment:     Localized abscess which may be sterile in nature with no indication of proximal infection    Plan:     X-ray reviewed with patient and using sterile his mentation I opened the area up and it was clear fluid that I drained. I flushed copiously and did not note any channels at the current time and found to be superficial and I carefully removed some of the superficial tissue. There was no odor noted and today I applied sterile dressing to the area instructed on the beginnings of soaks and 2 days and placed on Z-Pak which he states he does tolerate well. I gave him strict instructions of any proximal edema erythema or other issues were to occur he is to reappoint immediately  It for signs of ostial lysis or abscess formation

## 2015-05-08 ENCOUNTER — Telehealth: Payer: Self-pay | Admitting: *Deleted

## 2015-05-08 NOTE — Telephone Encounter (Signed)
Called patient at [phone] (Home #) to check to see how they were doing from their ingrown toenail procedure that was performed on Thursday, April 30, 2015. Pt's wife stated, "Husband is doing much better now and not in any pain".

## 2015-05-11 ENCOUNTER — Ambulatory Visit: Payer: Medicare Other | Admitting: Podiatry

## 2015-05-11 ENCOUNTER — Ambulatory Visit (INDEPENDENT_AMBULATORY_CARE_PROVIDER_SITE_OTHER): Payer: Medicare Other | Admitting: Podiatry

## 2015-05-11 ENCOUNTER — Encounter: Payer: Self-pay | Admitting: Podiatry

## 2015-05-11 VITALS — Temp 97.8°F

## 2015-05-11 DIAGNOSIS — L03119 Cellulitis of unspecified part of limb: Secondary | ICD-10-CM | POA: Diagnosis not present

## 2015-05-11 DIAGNOSIS — M79672 Pain in left foot: Secondary | ICD-10-CM

## 2015-05-11 DIAGNOSIS — L600 Ingrowing nail: Secondary | ICD-10-CM | POA: Diagnosis not present

## 2015-05-11 DIAGNOSIS — L02619 Cutaneous abscess of unspecified foot: Secondary | ICD-10-CM

## 2015-05-12 NOTE — Progress Notes (Signed)
Subjective:     Patient ID: Gabriel [name], male   DOB: 1945-11-07, 70 y.o.   MRN: [id number]  HPI patient states it seems to be doing better but I was just concerned and wanted to get it checked on my left foot. I also have a little bit or redness on my right big toe where the ingrown toenail was removed   Review of Systems     Objective:   Physical Exam Neurovascular status intact crusted tissue around the left fifth MPJ localized in nature with no proximal edema erythema or drainage noted no odor noted currently. On the right hallux lateral border there is a small amount of red tissue localized with no proximal edema erythema drainage    Assessment:     Appears to be more localized in nature with no indications that there is any systemic issues currently. Advised on continued soaks finishing antibiotics and if any drainage or increased redness or swelling were to occur to let us know immediately    Plan:     Reviewed condition with patient and continue above soaks and monitoring and antibiotics

## 2015-05-13 ENCOUNTER — Ambulatory Visit (INDEPENDENT_AMBULATORY_CARE_PROVIDER_SITE_OTHER): Payer: Medicare Other

## 2015-05-13 VITALS — BP 126/82 | HR 65 | Temp 98.5°F | Ht 67.0 in | Wt 214.8 lb

## 2015-05-13 DIAGNOSIS — E538 Deficiency of other specified B group vitamins: Secondary | ICD-10-CM | POA: Diagnosis not present

## 2015-05-13 DIAGNOSIS — Z1159 Encounter for screening for other viral diseases: Secondary | ICD-10-CM | POA: Diagnosis not present

## 2015-05-13 DIAGNOSIS — G4733 Obstructive sleep apnea (adult) (pediatric): Secondary | ICD-10-CM

## 2015-05-13 DIAGNOSIS — R5383 Other fatigue: Secondary | ICD-10-CM | POA: Diagnosis not present

## 2015-05-13 DIAGNOSIS — Z0000 Encounter for general adult medical examination without abnormal findings: Secondary | ICD-10-CM

## 2015-05-13 DIAGNOSIS — E669 Obesity, unspecified: Secondary | ICD-10-CM | POA: Diagnosis not present

## 2015-05-13 DIAGNOSIS — Z23 Encounter for immunization: Secondary | ICD-10-CM | POA: Diagnosis not present

## 2015-05-13 DIAGNOSIS — R030 Elevated blood-pressure reading, without diagnosis of hypertension: Secondary | ICD-10-CM

## 2015-05-13 LAB — CBC WITH DIFFERENTIAL/PLATELET
BASOS PCT: 0.9 % (ref 0.0–3.0)
Basophils Absolute: 0.1 10*3/uL (ref 0.0–0.1)
EOS PCT: 4.1 % (ref 0.0–5.0)
Eosinophils Absolute: 0.3 10*3/uL (ref 0.0–0.7)
HEMATOCRIT: 42.8 % (ref 39.0–52.0)
HEMOGLOBIN: 14.4 g/dL (ref 13.0–17.0)
LYMPHS PCT: 25.1 % (ref 12.0–46.0)
Lymphs Abs: 1.7 10*3/uL (ref 0.7–4.0)
MCHC: 33.8 g/dL (ref 30.0–36.0)
MCV: 90.9 fl (ref 78.0–100.0)
Monocytes Absolute: 0.6 10*3/uL (ref 0.1–1.0)
Monocytes Relative: 9.4 % (ref 3.0–12.0)
NEUTROS ABS: 4 10*3/uL (ref 1.4–7.7)
Neutrophils Relative %: 60.5 % (ref 43.0–77.0)
PLATELETS: 183 10*3/uL (ref 150.0–400.0)
RBC: 4.71 Mil/uL (ref 4.22–5.81)
RDW: 14.4 % (ref 11.5–15.5)
WBC: 6.7 10*3/uL (ref 4.0–10.5)

## 2015-05-13 LAB — COMPREHENSIVE METABOLIC PANEL
ALBUMIN: 3.9 g/dL (ref 3.5–5.2)
ALT: 15 U/L (ref 0–53)
AST: 19 U/L (ref 0–37)
Alkaline Phosphatase: 93 U/L (ref 39–117)
BUN: 20 mg/dL (ref 6–23)
CALCIUM: 9.3 mg/dL (ref 8.4–10.5)
CHLORIDE: 105 meq/L (ref 96–112)
CO2: 32 meq/L (ref 19–32)
CREATININE: 1.1 mg/dL (ref 0.40–1.50)
GFR: 70.86 mL/min (ref >60.00)
Glucose, Bld: 68 mg/dL — ABNORMAL LOW (ref 70–99)
POTASSIUM: 4.2 meq/L (ref 3.5–5.1)
SODIUM: 141 meq/L (ref 135–145)
Total Bilirubin: 0.5 mg/dL (ref 0.2–1.2)
Total Protein: 6.8 g/dL (ref 6.0–8.3)

## 2015-05-13 LAB — HEPATITIS C ANTIBODY: HCV AB: NEGATIVE

## 2015-05-13 LAB — TSH: TSH: 0.57 u[IU]/mL (ref 0.35–4.50)

## 2015-05-13 LAB — VITAMIN B12: Vitamin B-12: 205 pg/mL — ABNORMAL LOW (ref 211–911)

## 2015-05-13 NOTE — Progress Notes (Signed)
Pre visit review using our clinic review tool, if applicable. No additional management support is needed unless otherwise documented below in the visit note. 

## 2015-05-13 NOTE — Patient Instructions (Addendum)
Mr. Baes , Thank you for taking time to come for your Medicare Wellness Visit. I appreciate your ongoing commitment to your health goals. Please review the following plan we discussed and let me know if I can assist you in the future.   These are the goals we discussed: Goals    None      This is a list of the screening recommended for you and due dates:  Health Maintenance  Topic Date Due  .  Hepatitis C: One time screening is recommended by Center for Disease Control  (CDC) for  adults born from [birth year] through 1963.   Completed  . Pneumonia vaccines (1 of 2 - PCV13) 05/12/2016  . Flu Shot  09/08/2015  . Colon Cancer Screening  06/30/2017  . Tetanus Vaccine  05/01/2019  . DTaP/Tdap/Td vaccine  Completed  . Shingles Vaccine  Completed   Preventive Care for Adults  A healthy lifestyle and preventive care can promote health and wellness. Preventive health guidelines for adults include the following key practices.  . A routine yearly physical is a good way to check with your health care provider about your health and preventive screening. It is a chance to share any concerns and updates on your health and to receive a thorough exam.  . Visit your dentist for a routine exam and preventive care every 6 months. Brush your teeth twice a day and floss once a day. Good oral hygiene prevents tooth decay and gum disease.  . The frequency of eye exams is based on your age, health, family medical history, use  of contact lenses, and other factors. Follow your health care provider's ecommendations for frequency of eye exams.  . Eat a healthy diet. Foods like vegetables, fruits, whole grains, low-fat dairy products, and lean protein foods contain the nutrients you need without too many calories. Decrease your intake of foods high in solid fats, added sugars, and salt. Eat the right amount of calories for you. Get information about a proper diet from your health care provider, if necessary.  .  Regular physical exercise is one of the most important things you can do for your health. Most adults should get at least 150 minutes of moderate-intensity exercise (any activity that increases your heart rate and causes you to sweat) each week. In addition, most adults need muscle-strengthening exercises on 2 or more days a week.  Silver Sneakers may be a benefit available to you. To determine eligibility, you may visit the website: www.silversneakers.com or contact program at [phone] 003-5758 Mon-Fri between 8AM-8PM.   . Maintain a healthy weight. The body mass index (BMI) is a screening tool to identify possible weight problems. It provides an estimate of body fat based on height and weight. Your health care provider can find your BMI and can help you achieve or maintain a healthy weight.   For adults 20 years and older: ? A BMI below 18.5 is considered underweight. ? A BMI of 18.5 to 24.9 is normal. ? A BMI of 25 to 29.9 is considered overweight. ? A BMI of 30 and above is considered obese.   . Maintain normal blood lipids and cholesterol levels by exercising and minimizing your intake of saturated fat. Eat a balanced diet with plenty of fruit and vegetables. Blood tests for lipids and cholesterol should begin at age 90 and be repeated every 5 years. If your lipid or cholesterol levels are high, you are over 50, or you are at high risk for heart disease,  you may need your cholesterol levels checked more frequently. Ongoing high lipid and cholesterol levels should be treated with medicines if diet and exercise are not working.  . If you smoke, find out from your health care provider how to quit. If you do not use tobacco, please do not start.  . If you choose to drink alcohol, please do not consume more than 2 drinks per day. One drink is considered to be 12 ounces (355 mL) of beer, 5 ounces (148 mL) of wine, or 1.5 ounces (44 mL) of liquor.  . If you are 2-73 years old, ask your health care  provider if you should take aspirin to prevent strokes.  . Use sunscreen. Apply sunscreen liberally and repeatedly throughout the day. You should seek shade when your shadow is shorter than you. Protect yourself by wearing long sleeves, pants, a wide-brimmed hat, and sunglasses year round, whenever you are outdoors.  . Once a month, do a whole body skin exam, using a mirror to look at the skin on your back. Tell your health care provider of new moles, moles that have irregular borders, moles that are larger than a pencil eraser, or moles that have changed in shape or color.

## 2015-05-13 NOTE — Progress Notes (Signed)
I reviewed health advisor's note, was available for consultation, and agree with documentation and plan.  

## 2015-05-13 NOTE — Progress Notes (Signed)
Subjective:   Gabriel [name] is a 70 y.o. male who presents for an Initial Medicare Annual Wellness Visit.  Cardiac Risk Factors include: advanced age (>110men, >70 women);male gender;obesity (BMI >30kg/m2)    Objective:    Today's Vitals   05/13/15 0834  BP: 126/82  Pulse: 65  Temp: 98.5 F (36.9 C)  TempSrc: Oral  Height: 5\' 7"  (1.702 m)  Weight: 214 lb 12 oz (97.41 kg)  SpO2: 96%  PainSc: 0-No pain   Body mass index is 33.63 kg/(m^2).  Current Medications (verified) Outpatient Encounter Prescriptions as of 05/13/2015  Medication Sig  . acetaminophen (TYLENOL) 500 MG tablet Take 500 mg by mouth every 6 (six) hours as needed.  Marland Kitchen amoxicillin-clavulanate (AUGMENTIN) 875-125 MG tablet Take 1 tablet by mouth 2 (two) times daily.  Marland Kitchen buPROPion (WELLBUTRIN XL) 300 MG 24 hr tablet Take one tablet daily **MUST HAVE PHYSICAL FOR FURTHER REFILLS**  . Melatonin 5 MG CAPS Take 5 mg by mouth at bedtime as needed.  . meloxicam (MOBIC) 7.5 MG tablet Take 1 tablet (7.5 mg total) by mouth daily. (Patient taking differently: Take 7.5 mg by mouth daily as needed. )  . methocarbamol (ROBAXIN) 750 MG tablet TAKE 1 TABLET THREE TIMES A DAY AS NEEDED  . modafinil (PROVIGIL) 100 MG tablet Take 1 tablet (100 mg total) by mouth 2 (two) times daily.  Marland Kitchen omeprazole (PRILOSEC) 20 MG capsule TAKE 1 CAPSULE DAILY  . psyllium (REGULOID) 0.52 g capsule Take 0.52 g by mouth daily as needed.  . zolpidem (AMBIEN) 10 MG tablet Take 0.5 tablets (5 mg total) by mouth at bedtime as needed for sleep.  . tamsulosin (FLOMAX) 0.4 MG CAPS capsule 1 capsule daily. Reported on 05/13/2015  . [DISCONTINUED] doxycycline (VIBRA-TABS) 100 MG tablet Take 1 tablet (100 mg total) by mouth 2 (two) times daily.   No facility-administered encounter medications on file as of 05/13/2015.    Allergies (verified) Amoxicillin; Azithromycin; Erythromycin; and Sulfa drugs cross reactors   History: Past Medical History  Diagnosis Date  .  Seronegative arthritis     RA, previously on MTX, RF <7, ANA nl (rheum Dr. Donnamarie Poag)  . Hematuria - cause not known     s/p urology workup 2010, nl CT, cystoscopy (doesn't want rpt), cytology  . History of chicken pox   . Depression with anxiety     h/o remote hospitalization for depression  . GERD (gastroesophageal reflux disease)   . History of colon polyps 2005    tubular adenoma, on rpt no more (2010)  . OSA on CPAP     cpap machine at 10cm, ambien, daytime drowsiness attributed to OSA, last changed dextroamphetamine to nuvigil, previously tried provigil  . Torn rotator cuff     bilateral, s/p L repair and now recurrent, chronic massive R RTC tear (supraspinatus)  . Chronic lower back pain     DDD, prior on lyrica, gabapentin, lumbar stenosis with persistent numbness R ant thigh, ESI didn't help, now seeing Branch  . BPH (benign prostatic hypertrophy)     h/o prostate nodule, followed by Dr. Risa Grill yearly  . Insomnia     Dohmeier  . Cervical neck pain with evidence of disc disease     h/o bulging discs  . Prostate nodule     followed by Risa Grill, reassuring exam and PSA   Past Surgical History  Procedure Laterality Date  . Achilles tendon repair  2002  . Cataract extraction  2007    left  .  Rotator cuff repair  2008    Left (Dr. Gaylyn Rong in [place], now [place])  . Lumbar disc surgery  07/2010    L3/4 diskectomy (Dr. Harl Bowie at [facility])  . Colonoscopy  11/2008    diverticulosis, rpt 5 yrs  . Mri thoracic  09/2010    thoracic DDD/kyphosis, minimal anterolisthesis T2/3 with B foraminal narrowing  . Upper gastrointestinal endoscopy  02/2005    minimal GERD, small gastric polyp, tiny HH  . Dobutamine stress echo  08/2006    negative for ischemia  . Colonoscopy  07/2014    2 TA, mild diverticulosis, rpt 5 yrs [name])   Family History  Problem Relation Age of Onset  . Arthritis Mother   . COPD Father     smoker  . Cancer Neg Hx   . Stroke Neg Hx   . Coronary artery disease Neg Hx    . Diabetes Neg Hx    Social History   Occupational History  . Clergyman Other    [employer]   Social History Main Topics  . Smoking status: Never Smoker   . Smokeless tobacco: Never Used  . Alcohol Use: No  . Drug Use: No  . Sexual Activity: Yes   Tobacco Counseling Counseling given: No   Activities of Daily Living In your present state of health, do you have any difficulty performing the following activities: 05/13/2015  Hearing? N  Vision? N  Difficulty concentrating or making decisions? N  Walking or climbing stairs? N  Dressing or bathing? N  Doing errands, shopping? N  Preparing Food and eating ? N  Using the Toilet? N  In the past six months, have you accidently leaked urine? Y  Do you have problems with loss of bowel control? N  Managing your Medications? N  Managing your Finances? N  Housekeeping or managing your Housekeeping? N    Immunizations and Health Maintenance Immunization History  Administered Date(s) Administered  . Hepatitis A 08/27/2007, 02/22/2008  . Influenza Split 11/10/2011  . Influenza,inj,Quad PF,36+ Mos 11/01/2012, 11/11/2013, 11/17/2014  . Pneumococcal Conjugate-13 05/13/2015  . Tdap 04/30/2009  . Zoster 11/26/2007   There are no preventive care reminders to display for this patient.  Patient Care Team: Ria Bush, MD as PCP - General (Family Medicine) Star Age, MD as Attending Physician (Neurology) Marchia Bond, MD as Consulting Physician (Orthopedic Surgery) Rana Snare, MD as Consulting Physician (Urology) Inda Castle, MD as Consulting Physician (Gastroenterology) Wallene Huh, DPM as Consulting Physician (Podiatry)    Assessment:   This is a routine wellness examination for Doniven.   Hearing/Vision screen  Hearing Screening   125Hz  250Hz  500Hz  1000Hz  2000Hz  4000Hz  8000Hz   Right ear:   0 40 40 0   Left ear:   0 0 40 0   Vision Screening Comments: Last eye exam at [facility] in 2016.     Dietary issues and exercise activities discussed: Current Exercise Habits: Structured exercise class, Type of exercise: Other - see comments (swimming; water aerobics), Time (Minutes): 60, Frequency (Times/Week): 3, Weekly Exercise (Minutes/Week): 180, Intensity: Moderate, Exercise limited by: None identified  Goals    . Eat more fruits and vegetables     Starting 05/13/2015, I will begin eating at least 4-5 servings of fresh fruits and vegetables.       Depression Screen PHQ 2/9 Scores 05/13/2015  PHQ - 2 Score 0    Fall Risk Fall Risk  05/13/2015  Falls in the past year? No  Cognitive Function: MMSE - Mini Mental State Exam 05/13/2015  Orientation to time 5  Orientation to Place 5  Registration 3  Attention/ Calculation 0  Recall 3  Language- name 2 objects 0  Language- repeat 1  Language- follow 3 step command 3  Language- read & follow direction 0  Write a sentence 0  Copy design 0  Total score 20   PLEASE NOTE: A Mini-Cog screen was completed. Maximum score is 20. A value of 0 denotes this part of [place] MMSE was not completed.  Orientation to Time - Max 5 Orientation to Place - Max 5 Registration - Max 3 Recall - Max 3 Language Repeat - Max 1 Language Follow 3 Step Command - Max 3   Screening Tests Health Maintenance  Topic Date Due  . INFLUENZA VACCINE  09/08/2015  . PNA vac Low Risk Adult (2 of 2 - PPSV23) - administered PCV13 on 05/13/15 05/12/2016  . COLONOSCOPY  06/30/2017  . TETANUS/TDAP  05/01/2019  . DTaP/Tdap/Td  Completed  . ZOSTAVAX  Completed  . Hepatitis C Screening - completed Completed        Plan:     I have personally reviewed and addressed the Medicare Annual Wellness questionnaire and have noted the following in the patient's chart:  A. Medical and social history B. Use of alcohol, tobacco or illicit drugs  C. Current medications and supplements D. Functional ability and status E.  Nutritional status F.  Physical  activity G. Advance directives H. List of other physicians I.  Hospitalizations, surgeries, and ER visits in previous 12 months J.  [place] to include hearing, vision, cognitive, depression L. Referrals and appointments - none  In addition, I have reviewed and discussed with patient certain preventive protocols, quality metrics, and best practice recommendations. A written personalized care plan for preventive services as well as general preventive health recommendations were provided to patient.  See attached scanned questionnaire for additional information.   Signed,   Lindell Noe, MHA, BS, LPN Health Advisor [id number]

## 2015-05-25 ENCOUNTER — Telehealth: Payer: Self-pay | Admitting: *Deleted

## 2015-05-25 NOTE — Telephone Encounter (Signed)
Dr. Paulla Dolly reviewed 04/30/2015 biopsy of left lower leg as wart.  I informed pt of the results and asked how the biopsy area was, he stated it was fine but the right was not doing well.  I asked pt if he would like me to transfer to schedulers and he stated that he was out of town, but would call once back in town.

## 2015-05-28 ENCOUNTER — Ambulatory Visit (INDEPENDENT_AMBULATORY_CARE_PROVIDER_SITE_OTHER): Payer: Medicare Other | Admitting: Family Medicine

## 2015-05-28 ENCOUNTER — Encounter: Payer: Self-pay | Admitting: Family Medicine

## 2015-05-28 VITALS — BP 126/76 | HR 68 | Temp 98.4°F | Wt 214.0 lb

## 2015-05-28 DIAGNOSIS — E66811 Obesity, class 1: Secondary | ICD-10-CM

## 2015-05-28 DIAGNOSIS — G4700 Insomnia, unspecified: Secondary | ICD-10-CM

## 2015-05-28 DIAGNOSIS — K219 Gastro-esophageal reflux disease without esophagitis: Secondary | ICD-10-CM | POA: Diagnosis not present

## 2015-05-28 DIAGNOSIS — F418 Other specified anxiety disorders: Secondary | ICD-10-CM | POA: Diagnosis not present

## 2015-05-28 DIAGNOSIS — H918X1 Other specified hearing loss, right ear: Secondary | ICD-10-CM | POA: Diagnosis not present

## 2015-05-28 DIAGNOSIS — G4710 Hypersomnia, unspecified: Secondary | ICD-10-CM | POA: Diagnosis not present

## 2015-05-28 DIAGNOSIS — R5383 Other fatigue: Secondary | ICD-10-CM

## 2015-05-28 DIAGNOSIS — E538 Deficiency of other specified B group vitamins: Secondary | ICD-10-CM | POA: Diagnosis not present

## 2015-05-28 DIAGNOSIS — G4733 Obstructive sleep apnea (adult) (pediatric): Secondary | ICD-10-CM | POA: Diagnosis not present

## 2015-05-28 DIAGNOSIS — H6121 Impacted cerumen, right ear: Secondary | ICD-10-CM | POA: Diagnosis not present

## 2015-05-28 DIAGNOSIS — N400 Enlarged prostate without lower urinary tract symptoms: Secondary | ICD-10-CM | POA: Insufficient documentation

## 2015-05-28 DIAGNOSIS — E669 Obesity, unspecified: Secondary | ICD-10-CM | POA: Insufficient documentation

## 2015-05-28 MED ORDER — ZOLPIDEM TARTRATE 5 MG PO TABS: 5.0000 mg | ORAL_TABLET | Freq: Every evening | ORAL | Status: DC | PRN

## 2015-05-28 MED ORDER — BUPROPION HCL ER (XL) 300 MG PO TB24
ORAL_TABLET | ORAL | Status: DC
Start: 2015-05-28 — End: 2016-04-30

## 2015-05-28 MED ORDER — CYANOCOBALAMIN 1000 MCG/ML IJ SOLN
1000.0000 ug | Freq: Once | INTRAMUSCULAR | Status: AC
  Administered 2015-05-28: 1000 ug via INTRAMUSCULAR

## 2015-05-28 NOTE — Assessment & Plan Note (Signed)
Followed by Dr Rexene Alberts on [facility]

## 2015-05-28 NOTE — Progress Notes (Signed)
BP 126/76 mmHg  Pulse 68  Temp(Src) 98.4 F (36.9 C) (Oral)  Wt 214 lb (97.07 kg)  SpO2 96%   CC: discuss meds   Subjective:    Patient ID: [name], male    DOB: [birth year], 70 y.o.   MRN: [id number]  HPI: WADEN KAMMERDIENER is a 70 y.o. male presenting on 05/28/2015 for Medication Management; Check ears; and Discuss Lab results   Mother just placed in hospice care. 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   Saw Katha Cabal 4/5 for medicare wellness visit. Presents for f/u visit.   Requests ears evaluated - noticing more trouble with hearing. 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   Increasing nasal congestion/sneezing/cough with allergies despite claritin.   Started wearing mouthguard for grinding teeth.   Ongoing knee and shoulder pain - MCL + meniscal tear + arthritis. Rec against surgery by Dr Mardelle Matte. Wears brace for this.   BPH (benign prostatic hypertrophy) h/o prostate nodule, followed by Dr. Risa Grill yearly. Worried about effects of flomax on eyes - will discuss with urology flomax type vs 5a reductase inhibitors.   Relevant past medical, surgical, family and social history reviewed and updated as indicated. Interim medical history since our last visit reviewed. Allergies and medications reviewed and updated. Current Outpatient Prescriptions on File Prior to Visit  Medication Sig  . acetaminophen (TYLENOL) 500 MG tablet Take 500 mg by mouth every 6 (six) hours as needed.  . Melatonin 5 MG CAPS Take 5 mg by mouth at bedtime as needed.  . meloxicam (MOBIC) 7.5 MG tablet Take 1 tablet (7.5 mg total) by mouth daily. (Patient taking differently: Take 7.5 mg by mouth daily as needed. )  . methocarbamol (ROBAXIN) 750 MG tablet TAKE 1 TABLET THREE TIMES A DAY AS NEEDED (Patient taking differently: TAKE 1 TABLET TWICE A DAY AS NEEDED)  . modafinil (PROVIGIL) 100 MG tablet Take 1 tablet (100 mg total) by mouth 2 (two) times daily.  Marland Kitchen omeprazole (PRILOSEC) 20 MG  capsule TAKE 1 CAPSULE DAILY  . psyllium (REGULOID) 0.52 g capsule Take 0.52 g by mouth daily as needed.   No current facility-administered medications on file prior to visit.    Review of Systems Per HPI unless specifically indicated in ROS section     Objective:    BP 126/76 mmHg  Pulse 68  Temp(Src) 98.4 F (36.9 C) (Oral)  Wt 214 lb (97.07 kg)  SpO2 96%  Wt Readings from Last 3 Encounters:  05/28/15 214 lb (97.07 kg)  05/13/15 214 lb 12 oz (97.41 kg)  04/30/15 220 lb (99.791 kg)   Body mass index is 33.51 kg/(m^2).  Physical Exam  Constitutional: He appears well-developed and well-nourished. No distress.  HENT:  Right Ear: Hearing and external ear normal.  Left Ear: Hearing, tympanic membrane, external ear and ear canal normal.  Mouth/Throat: Oropharynx is clear and moist. No oropharyngeal exudate.  Soft cerumen covering R TM Irrigation performed today  Eyes: Conjunctivae and EOM are normal. Pupils are equal, round, and reactive to light. No scleral icterus.  Neck: Normal range of motion. Neck supple.  Cardiovascular: Normal rate, regular rhythm, normal heart sounds and intact distal pulses.   No murmur heard. Pulmonary/Chest: Effort normal and breath sounds normal. No respiratory distress. He has no wheezes. He has no rales.  Musculoskeletal: He exhibits no edema.  Skin: Skin is warm and dry. No rash noted.  Psychiatric: He has a normal mood and affect.  Nursing  note and vitals reviewed.  Results for orders placed or performed in visit on 05/13/15  Hepatitis C Antibody  Result Value Ref Range   HCV Ab NEGATIVE NEGATIVE  Vitamin B12  Result Value Ref Range   Vitamin B-12 205 (L) 211 - 911 pg/mL  Comprehensive metabolic panel  Result Value Ref Range   Sodium 141 135 - 145 mEq/L   Potassium 4.2 3.5 - 5.1 mEq/L   Chloride 105 96 - 112 mEq/L   CO2 32 19 - 32 mEq/L   Glucose, Bld 68 (L) 70 - 99 mg/dL   BUN 20 6 - 23 mg/dL   Creatinine, Ser 1.10 0.40 - 1.50 mg/dL    Total Bilirubin 0.5 0.2 - 1.2 mg/dL   Alkaline Phosphatase 93 39 - 117 U/L   AST 19 0 - 37 U/L   ALT 15 0 - 53 U/L   Total Protein 6.8 6.0 - 8.3 g/dL   Albumin 3.9 3.5 - 5.2 g/dL   Calcium 9.3 8.4 - 10.5 mg/dL   GFR 70.86 >60.00 mL/min  CBC with Differential  Result Value Ref Range   WBC 6.7 4.0 - 10.5 K/uL   RBC 4.71 4.22 - 5.81 Mil/uL   Hemoglobin 14.4 13.0 - 17.0 g/dL   HCT 42.8 39.0 - 52.0 %   MCV 90.9 78.0 - 100.0 fl   MCHC 33.8 30.0 - 36.0 g/dL   RDW 14.4 11.5 - 15.5 %   Platelets 183.0 150.0 - 400.0 K/uL   Neutrophils Relative % 60.5 43.0 - 77.0 %   Lymphocytes Relative 25.1 12.0 - 46.0 %   Monocytes Relative 9.4 3.0 - 12.0 %   Eosinophils Relative 4.1 0.0 - 5.0 %   Basophils Relative 0.9 0.0 - 3.0 %   Neutro Abs 4.0 1.4 - 7.7 K/uL   Lymphs Abs 1.7 0.7 - 4.0 K/uL   Monocytes Absolute 0.6 0.1 - 1.0 K/uL   Eosinophils Absolute 0.3 0.0 - 0.7 K/uL   Basophils Absolute 0.1 0.0 - 0.1 K/uL  TSH  Result Value Ref Range   TSH 0.57 0.35 - 4.50 uIU/mL      Assessment & Plan:  Over 25 minutes were spent face-to-face with the patient during this encounter and >50% of that time was spent on counseling and coordination of care  Problem List Items Addressed This Visit    Depression with anxiety    Reviewed caregiver stressors. Support provided. Stable on wellbutrin - refilled today.      GERD (gastroesophageal reflux disease)    Stable on PPI - continue      OSA (obstructive sleep apnea)    Appreciate sleep MD care of patient. Sees Dr Rexene Alberts      Hypersomnolence    Followed by Dr Rexene Alberts on modafanil      Insomnia    longterm low dose ambien use - will [facility] today.      Fatigue    Low b12 - shot today, then start 1041mcg oral daily.      Vitamin B12 deficiency    B12 shot today - then start 1073mcg daily OTC. Check IF next lab visit.      Relevant Orders   Intrinsic Factor Antibodies   Hearing loss of right ear due to cerumen impaction  - Primary    Cerumen irrigation performed today on right side due to cerumen impaction      BPH (benign prostatic hypertrophy)    Pt worried about continued flomax use -  discussed possible finasteride. Suggested discuss concerns with urology. Voiding currently stable.       Obesity, Class I, BMI 30-34.9   Relevant Orders   Lipid panel       Follow up plan: Return in about 6 months (around 11/27/2015) for follow up visit.  Ria Bush, MD

## 2015-05-28 NOTE — Assessment & Plan Note (Signed)
Stable on PPI - continue

## 2015-05-28 NOTE — Assessment & Plan Note (Signed)
B12 shot today - then start 103mcg daily OTC. Check IF next lab visit.

## 2015-05-28 NOTE — Assessment & Plan Note (Signed)
Pt worried about continued flomax use - discussed possible finasteride. Suggested discuss concerns with urology. Voiding currently stable.

## 2015-05-28 NOTE — Assessment & Plan Note (Signed)
Cerumen irrigation performed today on right side due to cerumen impaction

## 2015-05-28 NOTE — Assessment & Plan Note (Signed)
Appreciate sleep MD care of patient. Sees Dr Rexene Alberts

## 2015-05-28 NOTE — Progress Notes (Signed)
Pre visit review using our clinic review tool, if applicable. No additional management support is needed unless otherwise documented below in the visit note. 

## 2015-05-28 NOTE — Assessment & Plan Note (Signed)
Low b12 - shot today, then start 1065mcg oral daily.

## 2015-05-28 NOTE — Addendum Note (Signed)
Addended by: Royann Shivers A on: 05/28/2015 02:15 PM   Modules accepted: Orders

## 2015-05-28 NOTE — Patient Instructions (Addendum)
We will [facility].  Ear irrigation performed today. Good to see you today, call us with questions. B12 shot today. Return in 6 months for follow up

## 2015-05-28 NOTE — Assessment & Plan Note (Addendum)
Reviewed caregiver stressors. Support provided. Stable on wellbutrin - refilled today.

## 2015-05-28 NOTE — Assessment & Plan Note (Signed)
longterm low dose ambien use - will [facility]ill [facility] today.

## 2015-06-04 ENCOUNTER — Encounter: Payer: Self-pay | Admitting: Podiatry

## 2015-06-04 ENCOUNTER — Ambulatory Visit (INDEPENDENT_AMBULATORY_CARE_PROVIDER_SITE_OTHER): Payer: Medicare Other | Admitting: Podiatry

## 2015-06-04 VITALS — BP 141/85 | HR 69 | Resp 12

## 2015-06-04 DIAGNOSIS — M779 Enthesopathy, unspecified: Secondary | ICD-10-CM

## 2015-06-04 DIAGNOSIS — M21629 Bunionette of unspecified foot: Secondary | ICD-10-CM | POA: Diagnosis not present

## 2015-06-04 MED ORDER — TRIAMCINOLONE ACETONIDE 10 MG/ML IJ SUSP
10.0000 mg | Freq: Once | INTRAMUSCULAR | Status: AC
  Administered 2015-06-04: 10 mg

## 2015-06-07 NOTE — Progress Notes (Signed)
Subjective:     Patient ID: Gabriel [name], male   DOB: 1945/12/19, 70 y.o.   MRN: [id number]  HPI patient presents with inflammation around the fifth MPJ left that sore when pressed with no breakdown of tissue currently and no drainage   Review of Systems     Objective:   Physical Exam Neurovascular status intact with inflammation around the fifth metatarsal head left which may be more related to structural changes versus any indications of other pathology with well-healed wart site plantar left fifth metatarsal    Assessment:     Inflammatory capsulitis condition with well-healed previous surgical site    Plan:     Reviewed condition and at this point I have recommended careful cortisone injection explaining procedure and risk. Patient wants procedure and I infiltrated 60 mg Xylocaine proximal to it and then injected with 3 mg Dexon some Kenalog and applied thick pad to reduce pressure and reappoint if symptoms continue and discussed possibility for fifth metatarsal head resection associated with tailor bunion deformity and educated him on the procedure that may be necessary

## 2015-06-15 ENCOUNTER — Other Ambulatory Visit: Payer: Self-pay | Admitting: Family Medicine

## 2015-06-16 ENCOUNTER — Encounter: Payer: Self-pay | Admitting: Neurology

## 2015-06-25 ENCOUNTER — Encounter: Payer: Self-pay | Admitting: Podiatry

## 2015-06-25 ENCOUNTER — Ambulatory Visit (INDEPENDENT_AMBULATORY_CARE_PROVIDER_SITE_OTHER): Payer: Medicare Other | Admitting: Podiatry

## 2015-06-25 DIAGNOSIS — B079 Viral wart, unspecified: Secondary | ICD-10-CM

## 2015-06-25 DIAGNOSIS — M21629 Bunionette of unspecified foot: Secondary | ICD-10-CM | POA: Diagnosis not present

## 2015-06-25 DIAGNOSIS — B078 Other viral warts: Secondary | ICD-10-CM

## 2015-06-25 NOTE — Progress Notes (Signed)
Subjective:     Patient ID: Gabriel [name], male   DOB: Jun 23, 1945, 70 y.o.   MRN: [id number]  HPI patient presents stating this lesion has started to bother me again and I wanted to get it checked and it's on the bone   Review of Systems     Objective:   Physical Exam Neurovascular status intact muscle strength adequate with lesion underneath the fifth metatarsal head left with keratotic tissue formation    Assessment:     Lesion secondary to bone pressure left    Plan:     Reviewed possibility for osteotomy or metatarsal head resection if symptoms persist debris did lesion applied padding to reduce pressure and reappoint as needed

## 2015-07-01 DIAGNOSIS — M25561 Pain in right knee: Secondary | ICD-10-CM | POA: Diagnosis not present

## 2015-07-01 DIAGNOSIS — M25511 Pain in right shoulder: Secondary | ICD-10-CM | POA: Diagnosis not present

## 2015-07-08 ENCOUNTER — Ambulatory Visit: Payer: Self-pay | Admitting: Neurology

## 2015-07-13 ENCOUNTER — Ambulatory Visit (INDEPENDENT_AMBULATORY_CARE_PROVIDER_SITE_OTHER): Payer: Medicare Other | Admitting: Family Medicine

## 2015-07-13 ENCOUNTER — Telehealth: Payer: Self-pay

## 2015-07-13 ENCOUNTER — Encounter: Payer: Self-pay | Admitting: Family Medicine

## 2015-07-13 VITALS — BP 130/80 | HR 62 | Temp 98.2°F | Wt 217.1 lb

## 2015-07-13 DIAGNOSIS — K648 Other hemorrhoids: Secondary | ICD-10-CM

## 2015-07-13 DIAGNOSIS — K644 Residual hemorrhoidal skin tags: Secondary | ICD-10-CM | POA: Insufficient documentation

## 2015-07-13 MED ORDER — HYDROCORTISONE 2.5 % RE CREA: 1.0000 "application " | TOPICAL_CREAM | Freq: Two times a day (BID) | RECTAL | Status: DC

## 2015-07-13 NOTE — Progress Notes (Signed)
   BP 130/80 mmHg  Pulse 62  Temp(Src) 98.2 F (36.8 C)  Wt 217 lb 1.9 oz (98.485 kg)  SpO2 98%   CC: check spot in rectum  Subjective:    Patient ID: Gabriel [name], male    DOB: May 21, 1945, 70 y.o.   MRN: [id number]  HPI: Gabriel [name] is a 70 y.o. male presenting on 07/13/2015 for Follow-up   Over weekend while showering noticed lump in rectal area.  Not tender, not itchy. No bleeding noted.  No recent straining or significant constipation. Ongoing fecal urgency  Relevant past medical, surgical, family and social history reviewed and updated as indicated. Interim medical history since our last visit reviewed. Allergies and medications reviewed and updated. Current Outpatient Prescriptions on File Prior to Visit  Medication Sig  . acetaminophen (TYLENOL) 500 MG tablet Take 500 mg by mouth every 6 (six) hours as needed.  Marland Kitchen buPROPion (WELLBUTRIN XL) 300 MG 24 hr tablet Take one tablet daily  . loratadine (CLARITIN) 10 MG tablet Take 10 mg by mouth daily as needed for allergies.  . Melatonin 5 MG CAPS Take 5 mg by mouth at bedtime as needed.  . meloxicam (MOBIC) 7.5 MG tablet Take 1 tablet (7.5 mg total) by mouth daily. (Patient taking differently: Take 7.5 mg by mouth Nightly. )  . methocarbamol (ROBAXIN) 750 MG tablet TAKE 1 TABLET THREE TIMES A DAY AS NEEDED (Patient taking differently: TAKE 1 TABLET TWICE A DAY AS NEEDED)  . modafinil (PROVIGIL) 100 MG tablet Take 1 tablet (100 mg total) by mouth 2 (two) times daily.  Marland Kitchen omeprazole (PRILOSEC) 20 MG capsule TAKE 1 CAPSULE DAILY  . psyllium (REGULOID) 0.52 g capsule Take 0.52 g by mouth daily as needed.  . zolpidem (AMBIEN) 5 MG tablet Take 1 tablet (5 mg total) by mouth at bedtime as needed for sleep.   No current facility-administered medications on file prior to visit.    Review of Systems Per HPI unless specifically indicated in ROS section     Objective:    BP 130/80 mmHg  Pulse 62  Temp(Src) 98.2 F (36.8 C)  Wt  217 lb 1.9 oz (98.485 kg)  SpO2 98%  Wt Readings from Last 3 Encounters:  07/13/15 217 lb 1.9 oz (98.485 kg)  05/28/15 214 lb (97.07 kg)  05/13/15 214 lb 12 oz (97.41 kg)    Physical Exam  Constitutional: He appears well-developed and well-nourished. No distress.  Genitourinary: Rectal exam shows external hemorrhoid (large superior right non thrombosed hemorrhoid). Rectal exam shows no internal hemorrhoid, no fissure, no tenderness and anal tone normal.  Psychiatric: He has a normal mood and affect.  Nursing note and vitals reviewed.     Assessment & Plan:   Problem List Items Addressed This Visit    External hemorrhoid - Primary    Evident right external hemorrhoid. Discussed pathophysiology of hemorrhoids, discussed treatment with warm sitz bath, avoid straining, miralax/water, and anusol HC cream. Discussed red flags to update [place] for same day referral to urgent surgical clinic. Pt agrees with plan.          Follow up plan: Return if symptoms worsen or fail to improve.  Ria Bush, MD

## 2015-07-13 NOTE — Telephone Encounter (Signed)
Pt has appt 07/13/15 at 11:15 with Dr Darnell Level.

## 2015-07-13 NOTE — Assessment & Plan Note (Signed)
Evident right external hemorrhoid. Discussed pathophysiology of hemorrhoids, discussed treatment with warm sitz bath, avoid straining, miralax/water, and anusol HC cream. Discussed red flags to update [place] for same day referral to urgent surgical clinic. Pt agrees with plan.

## 2015-07-13 NOTE — Telephone Encounter (Signed)
PLEASE NOTE: All timestamps contained within this report are represented as [place] Standard Time. CONFIDENTIALTY NOTICE: This fax transmission is intended only for the addressee. It contains information that is legally privileged, confidential or otherwise protected from use or disclosure. If you are not the intended recipient, you are strictly prohibited from reviewing, disclosing, copying using or disseminating any of this information or taking any action in reliance on or regarding this information. If you have received this fax in error, please notify us immediately by telephone so that we can arrange for its return to [place]. Phone: [phone], Toll-Free: [phone], Fax: [phone] Page: 1 of 2 Call Id: [id number] [place] Patient Name: Gabriel [name] Gender: Male DOB: August 15, 1945 Age: 71 Y 25 M Return Phone Number: [phone] (Primary) Address: City/State/Zip: [place] Client [place] Night - Client Client Site [place] Physician Ria Bush - MD Contact Type Call Who Is Calling Patient / Member / Family / Caregiver Call Type Triage / Clinical Relationship To Patient Self Return Phone Number [phone] (Primary) Chief Complaint Paging or Request for Consult Reason for Call Symptomatic / Request for Health Information Initial Comment Caller States wants the Dr paged that's all he would say ,he's the Pt PreDisposition Call Doctor Translation No Nurse Assessment Nurse: Martyn Ehrich, RN, Solmon Ice Date/Time (Eastern Time): 07/12/2015 7:41:04 PM Confirm and document reason for call. If symptomatic, describe symptoms. You must click the next button to save text entered. ---Offered triage. He has a nodule in his rectum. Colonoscopy 1 year ago. No fever. Colonoscopy a year ago said precancerous cells and they said retest in 5 yrs Has the  patient traveled out of the country within the last 30 days? ---No Does the patient have any new or worsening symptoms? ---Yes Will a triage be completed? ---Yes Related visit to physician within the last 2 weeks? ---No Does the PT have any chronic conditions? (i.e. diabetes, asthma, etc.) ---Yes List chronic conditions. ---sleep apnea Is this a behavioral health or substance abuse call? ---No Guidelines Guideline Title Affirmed Question Affirmed Notes Nurse Date/Time Eilene Ghazi Time) Rectal Symptoms Rash of rectal area (e.g., open sore, painful tiny water blisters, unexplained bumps) Gaddy, RN, Felicia [id number] PM Disp. Time Eilene Ghazi Time) Disposition Final User 07/12/2015 7:48:23 PM See Physician within 24 Hours Yes Gaddy, RN, Solmon Ice PLEASE NOTE: All timestamps contained within this report are represented as [place] Standard Time. CONFIDENTIALTY NOTICE: This fax transmission is intended only for the addressee. It contains information that is legally privileged, confidential or otherwise protected from use or disclosure. If you are not the intended recipient, you are strictly prohibited from reviewing, disclosing, copying using or disseminating any of this information or taking any action in reliance on or regarding this information. If you have received this fax in error, please notify us immediately by telephone so that we can arrange for its return to [place]. Phone: [phone], Toll-Free: [phone], Fax: [phone] Page: 2 of 2 Call Id: [id number] Caller Understands: Yes Disagree/Comply: Comply Care Advice Given Per Guideline SEE PHYSICIAN WITHIN 24 HOURS: STD: If the patient is known to be sexually active, tell the patient to avoid sex until the problem has been diagnosed and treated. * Severe pain * Fever over 100.5 F (38.1 C) * You become worse. Referrals REFERRED TO PCP OFFICE

## 2015-07-13 NOTE — Telephone Encounter (Signed)
Will see today.  

## 2015-07-13 NOTE — Progress Notes (Signed)
Pre visit review using our clinic review tool, if applicable. No additional management support is needed unless otherwise documented below in the visit note. 

## 2015-07-13 NOTE — Patient Instructions (Signed)
You do have external hemorrhoid. Continue metamucil, lots of water, sitz baths, and anusol HC cream twice daily.  If thrombosing - may get more painful - let us know for referral to urgent surgery clinic in [place] or [place].   Hemorrhoids Hemorrhoids are swollen veins around the rectum or anus. There are two types of hemorrhoids:   Internal hemorrhoids. These occur in the veins just inside the rectum. They may poke through to the outside and become irritated and painful.  External hemorrhoids. These occur in the veins outside the anus and can be felt as a painful swelling or hard lump near the anus. CAUSES  Pregnancy.   Obesity.   Constipation or diarrhea.   Straining to have a bowel movement.   Sitting for long periods on the toilet.  Heavy lifting or other activity that caused you to strain.  Anal intercourse. SYMPTOMS   Pain.   Anal itching or irritation.   Rectal bleeding.   Fecal leakage.   Anal swelling.   One or more lumps around the anus.  DIAGNOSIS  Your caregiver may be able to diagnose hemorrhoids by visual examination. Other examinations or tests that may be performed include:   Examination of the rectal area with a gloved hand (digital rectal exam).   Examination of anal canal using a small tube (scope).   A blood test if you have lost a significant amount of blood.  A test to look inside the colon (sigmoidoscopy or colonoscopy). TREATMENT Most hemorrhoids can be treated at home. However, if symptoms do not seem to be getting better or if you have a lot of rectal bleeding, your caregiver may perform a procedure to help make the hemorrhoids get smaller or remove them completely. Possible treatments include:   Placing a rubber band at the base of the hemorrhoid to cut off the circulation (rubber band ligation).   Injecting a chemical to shrink the hemorrhoid (sclerotherapy).   Using a tool to burn the hemorrhoid (infrared light  therapy).   Surgically removing the hemorrhoid (hemorrhoidectomy).   Stapling the hemorrhoid to block blood flow to the tissue (hemorrhoid stapling).  HOME CARE INSTRUCTIONS   Eat foods with fiber, such as whole grains, beans, nuts, fruits, and vegetables. Ask your doctor about taking products with added fiber in them (fibersupplements).  Increase fluid intake. Drink enough water and fluids to keep your urine clear or pale yellow.   Exercise regularly.   Go to the bathroom when you have the urge to have a bowel movement. Do not wait.   Avoid straining to have bowel movements.   Keep the anal area dry and clean. Use wet toilet paper or moist towelettes after a bowel movement.   Medicated creams and suppositories may be used or applied as directed.   Only take over-the-counter or prescription medicines as directed by your caregiver.   Take warm sitz baths for 15-20 minutes, 3-4 times a day to ease pain and discomfort.   Place ice packs on the hemorrhoids if they are tender and swollen. Using ice packs between sitz baths may be helpful.   Put ice in a plastic bag.   Place a towel between your skin and the bag.   Leave the ice on for 15-20 minutes, 3-4 times a day.   Do not use a donut-shaped pillow or sit on the toilet for long periods. This increases blood pooling and pain.  SEEK MEDICAL CARE IF:  You have increasing pain and swelling that is not  controlled by treatment or medicine.  You have uncontrolled bleeding.  You have difficulty or you are unable to have a bowel movement.  You have pain or inflammation outside the area of the hemorrhoids. MAKE SURE YOU:  Understand these instructions.  Will watch your condition.  Will get help right away if you are not doing well or get worse.   This information is not intended to replace advice given to you by your health care provider. Make sure you discuss any questions you have with your health care  provider.   Document Released: 01/22/2000 Document Revised: 01/11/2012 Document Reviewed: 11/29/2011 Elsevier Interactive Patient Education [employer].

## 2015-08-18 ENCOUNTER — Encounter: Payer: Self-pay | Admitting: Neurology

## 2015-08-18 ENCOUNTER — Ambulatory Visit (INDEPENDENT_AMBULATORY_CARE_PROVIDER_SITE_OTHER): Payer: Medicare Other | Admitting: Neurology

## 2015-08-18 VITALS — BP 146/82 | HR 76 | Resp 16 | Ht 67.0 in | Wt 215.0 lb

## 2015-08-18 DIAGNOSIS — G4733 Obstructive sleep apnea (adult) (pediatric): Secondary | ICD-10-CM | POA: Diagnosis not present

## 2015-08-18 DIAGNOSIS — G4730 Sleep apnea, unspecified: Secondary | ICD-10-CM

## 2015-08-18 DIAGNOSIS — G4710 Hypersomnia, unspecified: Secondary | ICD-10-CM | POA: Diagnosis not present

## 2015-08-18 DIAGNOSIS — G4719 Other hypersomnia: Secondary | ICD-10-CM

## 2015-08-18 DIAGNOSIS — Z9989 Dependence on other enabling machines and devices: Secondary | ICD-10-CM

## 2015-08-18 MED ORDER — MODAFINIL 100 MG PO TABS: 100.0000 mg | ORAL_TABLET | Freq: Two times a day (BID) | ORAL | Status: DC

## 2015-08-18 NOTE — Progress Notes (Signed)
Subjective:    Patient ID: Gabriel [name] is a 70 y.o. male.  HPI     Interim history:   Mr. Viverette is a very friendly 70 year old left-handed woman with an underlying medical history of arthritis of the left shoulder, degenerative disc disease, cervical radiculopathy, depression with anxiety, reflux disease, obstructive sleep apnea, who presents for follow-up consultation of his OSA on CPAP with residual EDS. He is accompanied by his wife today. I last saw him on 01/06/2015, at which time he reported feeling fairly stable. He was using his CPAP regularly. He needed new supplies. He had ongoing stress. He was planning to retire soon. He was on generic Provigil 100 mg once or twice daily.   Today, 08/18/2015: I reviewed his CPAP compliance data from 07/17/2015 2 08/15/2015 which is a total of 30 days during which time he used his machine every night with percent used days greater than 4 hours at 97%, indicating excellent compliance with an average usage of 6 hours and 29 minutes, residual AHI 1.4 per hour, leak at times high with the 95th percentile at 25.4 L/m on a pressure of 11 cm with EPR 2.  Today, 08/18/2015: He reports Bilateral shoulder pain, right knee pain, feeling stressed, feeling somewhat anxious and depressed at times, is on Wellbutrin for this. His mother is in her 30s of in a nursing home, hospice is involved and he sees her every day to help her eat, and he also takes care of his wife. He has a biological son and an adopted son. 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 He still tired during the day, he is compliant with CPAP, does not always change his supplies on time. He is wondering if he is eligible for a new machine. We will send an order for this to his DME company and also CPAP supply order.  Previously:   Of note, the patient no showed for an appointment on 12/24/2014 secondary to sickness. I saw him on 06/19/2014, at which  time he reported compliance with treatment. He was on 5 mg of Ambien each night and 5 mg of melatonin each night. Modafinil was marginally helpful. He usually takes 1 in the morning and sometimes a second one in the afternoon. 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 He reported bilateral shoulder pain. He had to do a lot of lifting at home. His wife also needs help at night. This disrupts his sleep.   I reviewed his CPAP compliance data from 11/23/2014 through 12/22/2014 which is a total of 30 days during which time he used his machine every night with percent used days greater than 4 hours at 97%, indicating excellent compliance with an average usage of 6 hours and 8 minutes, residual AHI low at 1.1 per hour, leak acceptable with the 95th percentile at 16.2 L/m on a pressure of 11 cm with EPR of 2.  I saw him on 12/18/2013, at which time he reported having trouble sleeping at night. He felt sleepy during the day. He was on Provigil 100 mg once daily. He had more stress and more back pain. He was worried about his wife's health.  I reviewed his CPAP compliance data from 05/18/2014 through 06/16/2014 which is a total of 30 days during which time he used his machine every night with percent used days greater than 4 hours at 100%,  indicating superb compliance with an average usage of 6 hours and 33 minutes, residual AHI low at 1.2 per hour, leak however high, with the 95th percentile at 40.2 L/m on a pressure of 11 cm with EPR of 2.   I saw him on 07/02/2013, at which time I suggested he return for a full night CPAP study followed by a nap study the next day. He was advised to taper off his nortriptyline and Robaxin as well as stop his Provigil 7-10 days prior to sleep testing. He had a CPAP study on 08/27/2013. Sleep efficiency was 86.9%. Latency to sleep was 3.5 minutes. Wake after sleep onset was 55.5 minutes with moderate to severe sleep  fragmentation noted. He had an elevated arousal index at 15.2 per hour. He had an increased percentage of stage 1 sleep and a mildly increased percentage of REM sleep at 26.1% with a mildly prolonged REM latency of 130.5 minutes. He had frequent PVCs. He was on CPAP at the usual pressure of 10 cm which was then increased to 11 and 12 cm. I felt he did a little bit better on 11 cm and since it was not a big change from his baseline treatment pressure we proceeded with the next day nap study. He had an MSLT on 08/28/2013 with 5. Mean sleep latency was 10.4 with no REM onset naps. We called him with his test results on the phone. I did make an adjustment to his CPAP pressure to 11 cm.  I reviewed his compliance data from 11/16/2013 through 12/15/2013 which is a total of 30 days during which time he used his CPAP every night with percent used days greater than 4 hours of 97%, indicating excellent compliance, average usage of 6 hours and 17 minutes. Residual AHI at 1.2 per hour, leak at times was high with the 95th percentile at 47.3 L/m. Pressure at 11 cm with EPR of 2.  I saw him on 01/14/13, at which time we talked about his sleep study results and his CPAP   compliance which was good. He was well treated in terms of reduction of his residual AHI. I suggested he try Nuvigil as he had some side effects in the past with Provigil. We also talked about potentially bringing him back for further testing with MSLT down the [place]. In the interim, he was seen by our NP, Ms. Lam, on 04/17/2013, at which time he reported that he could not afford Nuvigil and we decided to prescribe Provigil again. He requested to try it again. We also scheduled him for a repeat sleep study followed by a nap study to further delineate and quantify his sleepiness. I asked him to taper off sedating medications with the help of his primary care physician and also discontinue his Provigil 7-10 days prior to the sleep studies. He was given  instructions by his primary care physician to come off of his muscle relaxer and nortriptyline. He did not have his sleep studies done and called to cancel those. I reviewed his CPAP compliance down from 03/18/2013 through 04/16/2013 which is a total of 30 days during which time he uses CPAP every night with 80% used days greater than 4 hours at 100%, indicating superb compliance with an average usage of 7 hours. Residual AHI was acceptable at 2.7 per hour and leak was acceptable at 20.1 L per minute at the 95th percentile.  I reviewed his compliance data from 06/02/2013 through 07/01/2013 which is the last 30 days during which  time he was 100% compliant. Average usage was 6 hours and 39 minutes. Residual AHI low at 1 per hour, leak was a time high with the 95th percentile at 34.7 L per minute, pressure at 10 cm with EPR of 2.   I first met him on 10/16/12, at which time, he reported problems initiating and maintaining sleep and ongoing severe EDS. He has been taking Pamelor at bedtime (previously Elavil, but had to stop because of severe mouth dryness) as well as Ambien 5 mg at bedtime and has been on it for about 25 years. I recommended re-evaluation of his OSA first with a split night sleep study. He has this test on 11/21/2012 and I went over his sleep study report in detail with him previously: His baseline sleep efficiency was reduced at 60.5% with a latency to sleep of 14 minutes and wake after sleep onset of 36 minutes with moderate to severe sleep fragmentation noted. He had a markedly elevated arousal index at 96.5 per hour. There was a markedly increased percentage of stage I sleep, reduced percentage of stage II sleep, and absence of slow-wave sleep and REM sleep prior to CPAP. He had periodic leg movements of 5.5 per hour. He had frequent, unifocal-appearing PVCs. He had mild to moderate snoring. His AHI was highly elevated at 83.9 per hour. His baseline oxygen saturation was 93%, his nadir was 84% in  non-REM sleep. He was then titrated on CPAP. His sleep efficiency was significantly increased and his arousal index improved. He had absence of slow wave sleep and 12.7% of REM sleep. His average oxygen saturation was 93%, his nadir was 82%. He was titrated from 5-11 cm of water pressure and his AHI was reduced to 0 events per hour at the pressure of 10. I also reviewed his compliance data from his machine: From 12/12/2012 through 01/13/2013 which is a total of 33 days during which she used CPAP every day. His average usage was 6 hours and 44 minutes. His residual AHI was 1.7 per hour on the pressure of 10 cm with EPR of 2. He had a fairly high leak at times however.   He is feeling better than before the sleep study. He is compliant with treatment, likes the machine better and the mask as well. She feels, his sleepiness is about the same, but he feels better. He may take a nap without CPAP. He does not keep a very good sleep schedule. He uses the bathroom about once per night.   He was previously diagnosed with OSA in 1998 and was prescribed CPAP of 12 cm it indicated full compliance. I reviewed his CPAP titration study from 01/24/2010 last time. His AHI was reduced to 0 per hour and O2 nadir was 91% for the night. REM latency of 49 minutes, but this was a titration study, and could have shown REM rebound.   He was tried on Provigil years ago, and it did not help and he felt nervous and irritable on it. He denies cataplexy, or hypogogic or hypnopompic hallucinations, but has had sleep paralysis. He had an MSLT about 10 years ago, which showed no evidence of narcolepsy per patient.    His Past Medical History Is Significant For: Past Medical History  Diagnosis Date  . Seronegative arthritis     RA, previously on MTX, RF <7, ANA nl (rheum Dr. Donnamarie Poag)  . Hematuria - cause not known     s/p urology workup 2010, nl CT, cystoscopy (doesn't want rpt),  cytology  . History of chicken pox   . Depression with  anxiety     h/o remote hospitalization for depression  . GERD (gastroesophageal reflux disease)   . History of colon polyps 2005    tubular adenoma, on rpt no more (2010)  . OSA on CPAP     cpap machine at 10cm, ambien, daytime drowsiness attributed to OSA, last changed dextroamphetamine to nuvigil, previously tried provigil  . Torn rotator cuff     bilateral, s/p L repair and now recurrent, chronic massive R RTC tear (supraspinatus)  . Chronic lower back pain     DDD, prior on lyrica, gabapentin, lumbar stenosis with persistent numbness R ant thigh, ESI didn't help, now seeing [facility]  . BPH (benign prostatic hypertrophy)     h/o prostate nodule, followed by Dr. Risa Grill yearly  . Insomnia     [place]  . Cervical neck pain with evidence of disc disease     h/o bulging discs  . Prostate nodule     followed by Risa Grill, reassuring exam and PSA    His Past Surgical History Is Significant For: Past Surgical History  Procedure Laterality Date  . Achilles tendon repair  2002  . Cataract extraction  2007    left  . Rotator cuff repair  2008    Left (Dr. Gaylyn Rong in [place], now [place])  . Lumbar disc surgery  07/2010    L3/4 diskectomy ([facility]. Harl Bowie at [facility])  . Colonoscopy  11/2008    diverticulosis, rpt 5 yrs  . Mri thoracic  09/2010    thoracic DDD/kyphosis, minimal anterolisthesis T2/3 with B foraminal narrowing  . Upper gastrointestinal endoscopy  02/2005    minimal GERD, small gastric polyp, tiny HH  . Dobutamine stress echo  08/2006    negative for ischemia  . Colonoscopy  07/2014    2 TA, mild diverticulosis, rpt 5 yrs [name])    His Family History Is Significant For: Family History  Problem Relation Age of Onset  . Arthritis Mother   . COPD Father     smoker  . Cancer Neg Hx   . Stroke Neg Hx   . Coronary artery disease Neg Hx   . Diabetes Neg Hx     His Social History Is Significant For: Social History   Social History  . Marital Status: Married    Spouse Name:  [name]  . Number of Children: 2  . Years of Education: Masters +   Occupational History  . Clergyman Other    [employer]   Social History Main Topics  . Smoking status: Never Smoker   . Smokeless tobacco: Never Used  . Alcohol Use: No  . Drug Use: No  . Sexual Activity: Yes   Other Topics Concern  . None   Social History Narrative   Caffeine: avoids   Patient is married [name]) Lives with wife - who is in a power chair.   Occupation: Scientist, product/process development, Oceanographer   Edu: masters +   Likes to travel   Activity: no regular activity, to join Y   Diet: some fruits/vegetables   Patient has two children.   Patient is left-handed.    His Allergies Are:  Allergies  Allergen Reactions  . Amoxicillin Nausea And Vomiting  . Azithromycin Other (See Comments)    Wide spread body aches  . Doxycycline Other (See Comments)    "don't like how it makes me feel" ?nausea  . Erythromycin Nausea And Vomiting  .  Sulfa Drugs Cross Reactors Other (See Comments)    [place]  :   His Current Medications Are:  Outpatient Encounter Prescriptions as of 08/18/2015  Medication Sig  . acetaminophen (TYLENOL) 500 MG tablet Take 500 mg by mouth every 6 (six) hours as needed.  Marland Kitchen alfuzosin (UROXATRAL) 10 MG 24 hr tablet Take 10 mg by mouth daily with breakfast.  . buPROPion (WELLBUTRIN XL) 300 MG 24 hr tablet Take one tablet daily  . hydrocortisone (ANUSOL-HC) 2.5 % rectal cream Place 1 application rectally 2 (two) times daily.  Marland Kitchen loratadine (CLARITIN) 10 MG tablet Take 10 mg by mouth daily as needed for allergies.  . Melatonin 5 MG CAPS Take 5 mg by mouth at bedtime as needed.  . meloxicam (MOBIC) 7.5 MG tablet Take 1 tablet (7.5 mg total) by mouth daily. (Patient taking differently: Take 7.5 mg by mouth Nightly. )  . methocarbamol (ROBAXIN) 750 MG tablet TAKE 1 TABLET THREE TIMES A DAY AS NEEDED (Patient taking differently: TAKE 1 TABLET TWICE A DAY AS NEEDED)  . modafinil (PROVIGIL) 100  MG tablet Take 1 tablet (100 mg total) by mouth 2 (two) times daily.  Marland Kitchen omeprazole (PRILOSEC) 20 MG capsule TAKE 1 CAPSULE DAILY  . psyllium (REGULOID) 0.52 g capsule Take 0.52 g by mouth daily as needed.  . vitamin B-12 (CYANOCOBALAMIN) 1000 MCG tablet Take 1,000 mcg by mouth daily.  Marland Kitchen zolpidem (AMBIEN) 5 MG tablet Take 1 tablet (5 mg total) by mouth at bedtime as needed for sleep.   No facility-administered encounter medications on file as of 08/18/2015.  :  Review of Systems:  Out of a complete 14 point review of systems, all are reviewed and negative with the exception of these symptoms as listed below:   Review of Systems  Neurological:       Patient states that he thinks that he is doing well with CPAP. Reports that he is still tired during the day. He helps care for his wife and mother.     Objective:  Neurologic Exam  Physical Exam Physical Examination:   Filed Vitals:   08/18/15 1517  BP: 146/82  Pulse: 76  Resp: 16   General Examination: The patient is a very pleasant 70 y.o. male in no acute distress. He appears well-developed and well-nourished and very well groomed. He is in good spirits today. His speech is somewhat pressured today.   HEENT: Normocephalic, atraumatic, pupils are equal, round and reactive to light and accommodation. Extraocular tracking is good without limitation to gaze excursion or nystagmus noted. Normal smooth pursuit is noted. Hearing is grossly intact. Face is symmetric with normal facial animation and normal facial sensation. Speech is clear with no dysarthria noted. There is no hypophonia. There is no lip, neck/head, jaw or voice tremor. Neck is supple with full range of passive and active motion. There are no carotid bruits on auscultation. Oropharynx exam reveals: mild mouth dryness, adequate dental hygiene and moderate airway crowding, due to narrow airway entry and larger tongue. Mallampati is class III. Tongue protrudes centrally and palate  elevates symmetrically. Tonsils are 1+.    Chest: Clear to auscultation without wheezing, rhonchi or crackles noted.  Heart: S1+S2+0, regular and normal without murmurs, rubs or gallops noted.   Abdomen: Soft, non-tender and non-distended with normal bowel sounds appreciated on auscultation.  Extremities: There is no pitting edema in the distal lower extremities bilaterally. Pedal pulses are intact.  Skin: Warm and dry without trophic changes noted. There are mild  varicose veins in the right distal leg.  Musculoskeletal: exam reveals: pain and some decrease in range of motion in both shoulders, unchanged. Pain in R knee, wearing a brace.   Neurologically:  Mental status: The patient is awake, alert and oriented in all 4 spheres. His memory, attention, language and knowledge are appropriate. There is no aphasia, agnosia, apraxia or anomia. Speech is clear with normal prosody and enunciation. Thought process is linear. Mood is congruent and affect is normal.  Cranial nerves are as described above under HEENT exam. In addition, shoulder shrug is normal with equal shoulder height noted. Motor exam: Normal bulk, strength and tone is noted. There is no drift, tremor or rebound. Romberg is negative, except for slight sway. Reflexes are 2+ throughout. Fine motor skills are intact with normal finger taps, normal hand movements, normal rapid alternating patting, normal foot taps and normal foot agility.  Cerebellar testing shows no dysmetria or intention tremor on finger to nose testing. Heel to shin is unremarkable bilaterally. There is no truncal or gait ataxia.  Sensory exam is intact to light touch in the upper and lower extremities.  Gait, station and balance are unremarkable. No veering to one side is noted. No leaning to one side is noted. Posture is age-appropriate and stance is narrow based. No problems turning are noted. He turns en bloc. Tandem walk is slightly difficult for him today.           Assessment and Plan:   In summary, ALVESTER EADS is a very pleasant 70 year old male with an underlying medical history of arthritis of the left shoulder, degenerative disc disease, cervical radiculopathy, depression with anxiety, and reflux disease, who presents for follow up consultation of his obstructive sleep apnea with residual EDS. He is on CPAP with full compliance and has always been fully compliant with treatment. Provigil has helped his daytime somnolence to some degree, But I do believe other factors playing into this as far as residual tiredness and fatigue is concerned. He has besides physical ailments, a lot of stress, situational anxiety and residual depression. He is advised that we can try to get him a new CPAP machine, definitely new supplies. I renewed his prescription for generic modafinil twice daily today. Physical exam is stable. He has contemplated retirement but feels that working is his only outlet besides having to take care of his wife and his elderly mother. He feels that he needs that physical and mental outlet. I suggested a 6 month follow-up, he can see one of our nurse practitioners and is agreeable. I will see him back after that. I answered all her questions today and the patient and his wife were in agreement.  I spent 25 minutes in total face-to-face time with the patient, more than 50% of which was spent in counseling and coordination of care, reviewing test results, reviewing medication and discussing or reviewing the diagnosis of sleepiness, and OSA, the prognosis and treatment options.

## 2015-08-18 NOTE — Patient Instructions (Signed)
Please continue using your CPAP regularly. While your insurance requires that you use CPAP at least 4 hours each night on 70% of the nights, I recommend, that you not skip any nights and use it throughout the night if you can. Getting used to CPAP and staying with the treatment long term does take time and patience and discipline. Untreated obstructive sleep apnea when it is moderate to severe can have an adverse impact on cardiovascular health and raise her risk for heart disease, arrhythmias, hypertension, congestive heart failure, stroke and diabetes. Untreated obstructive sleep apnea causes sleep disruption, nonrestorative sleep, and sleep deprivation. This can have an impact on your day to day functioning and cause daytime sleepiness and impairment of cognitive function, memory loss, mood disturbance, and problems focussing. Using CPAP regularly can improve these symptoms.  Keep up the good work! We will see you back in 6 months for sleep apnea check up, with one of our NPs, and I will see you after that.

## 2015-08-21 DIAGNOSIS — N401 Enlarged prostate with lower urinary tract symptoms: Secondary | ICD-10-CM | POA: Diagnosis not present

## 2015-08-21 DIAGNOSIS — R350 Frequency of micturition: Secondary | ICD-10-CM | POA: Diagnosis not present

## 2015-08-21 DIAGNOSIS — R3129 Other microscopic hematuria: Secondary | ICD-10-CM | POA: Diagnosis not present

## 2015-10-05 DIAGNOSIS — H40003 Preglaucoma, unspecified, bilateral: Secondary | ICD-10-CM | POA: Diagnosis not present

## 2015-11-11 DIAGNOSIS — M25561 Pain in right knee: Secondary | ICD-10-CM | POA: Diagnosis not present

## 2015-11-27 ENCOUNTER — Ambulatory Visit (INDEPENDENT_AMBULATORY_CARE_PROVIDER_SITE_OTHER): Payer: Medicare Other | Admitting: Family Medicine

## 2015-11-27 ENCOUNTER — Encounter: Payer: Self-pay | Admitting: Family Medicine

## 2015-11-27 VITALS — BP 124/84 | HR 76 | Temp 98.4°F | Wt 219.8 lb

## 2015-11-27 DIAGNOSIS — G4700 Insomnia, unspecified: Secondary | ICD-10-CM | POA: Diagnosis not present

## 2015-11-27 DIAGNOSIS — M25561 Pain in right knee: Secondary | ICD-10-CM | POA: Diagnosis not present

## 2015-11-27 DIAGNOSIS — G8929 Other chronic pain: Secondary | ICD-10-CM

## 2015-11-27 DIAGNOSIS — N400 Enlarged prostate without lower urinary tract symptoms: Secondary | ICD-10-CM | POA: Diagnosis not present

## 2015-11-27 DIAGNOSIS — G4710 Hypersomnia, unspecified: Secondary | ICD-10-CM | POA: Diagnosis not present

## 2015-11-27 DIAGNOSIS — E669 Obesity, unspecified: Secondary | ICD-10-CM

## 2015-11-27 DIAGNOSIS — E538 Deficiency of other specified B group vitamins: Secondary | ICD-10-CM

## 2015-11-27 DIAGNOSIS — R768 Other specified abnormal immunological findings in serum: Secondary | ICD-10-CM

## 2015-11-27 NOTE — Progress Notes (Signed)
BP 124/84   Pulse 76   Temp 98.4 F (36.9 C) (Oral)   Wt 219 lb 12 oz (99.7 kg)   BMI 34.42 kg/m    CC: 6 mo f/u visit Subjective:    Patient ID: Gabriel [name], male    DOB: April 25, 1945, 70 y.o.   MRN: [id number]  HPI: Gabriel [name] is a 70 y.o. male presenting on 11/27/2015 for Follow-up   Ongoing knee and shoulder pain - MCL + meniscal tear + arthritis. Rec against surgery by Dr Mardelle Matte. Received steroid injection to R knee 2 wks ago. Has been using brace as well. Treating with meloxicam and hydrocodone. No locking or instability  Insomnia - well controlled with ambien 5mg  nightly + melatonin.   H/o elevated ANA - requests recheck. Fingers feel intermittently swollen.   Continues substitute HS teaching.   Relevant past medical, surgical, family and social history reviewed and updated as indicated. Interim medical history since our last visit reviewed. Allergies and medications reviewed and updated. Current Outpatient Prescriptions on File Prior to Visit  Medication Sig  . acetaminophen (TYLENOL) 500 MG tablet Take 500 mg by mouth every 6 (six) hours as needed.  Marland Kitchen buPROPion (WELLBUTRIN XL) 300 MG 24 hr tablet Take one tablet daily  . loratadine (CLARITIN) 10 MG tablet Take 10 mg by mouth daily as needed for allergies.  . Melatonin 5 MG CAPS Take 5 mg by mouth at bedtime.   . meloxicam (MOBIC) 7.5 MG tablet Take 1 tablet (7.5 mg total) by mouth daily. (Patient taking differently: Take 7.5 mg by mouth as needed. )  . methocarbamol (ROBAXIN) 750 MG tablet TAKE 1 TABLET THREE TIMES A DAY AS NEEDED (Patient taking differently: 2-3 times daily)  . modafinil (PROVIGIL) 100 MG tablet Take 1 tablet (100 mg total) by mouth 2 (two) times daily.  Marland Kitchen omeprazole (PRILOSEC) 20 MG capsule TAKE 1 CAPSULE DAILY  . psyllium (REGULOID) 0.52 g capsule Take 0.52 g by mouth daily as needed.  . vitamin B-12 (CYANOCOBALAMIN) 1000 MCG tablet Take 1,000 mcg by mouth daily.  Marland Kitchen zolpidem (AMBIEN) 5 MG  tablet Take 1 tablet (5 mg total) by mouth at bedtime as needed for sleep. (Patient taking differently: Take 5 mg by mouth at bedtime. )   No current facility-administered medications on file prior to visit.     Review of Systems Per HPI unless specifically indicated in ROS section     Objective:    BP 124/84   Pulse 76   Temp 98.4 F (36.9 C) (Oral)   Wt 219 lb 12 oz (99.7 kg)   BMI 34.42 kg/m   Wt Readings from Last 3 Encounters:  11/27/15 219 lb 12 oz (99.7 kg)  08/18/15 215 lb (97.5 kg)  07/13/15 217 lb 1.9 oz (98.5 kg)    Physical Exam  Constitutional: He appears well-developed and well-nourished. No distress.  Musculoskeletal: He exhibits no edema.  FROM bilateral knees without significant crepitus Tender at R knee first few steps  Tender to palpation R knee medial joint line  No pain with palpation of hands, no swelling appreciated today.  Nursing note and vitals reviewed.  Results for orders placed or performed in visit on 05/13/15  Hepatitis C Antibody  Result Value Ref Range   HCV Ab NEGATIVE NEGATIVE  Vitamin B12  Result Value Ref Range   Vitamin B-12 205 (L) 211 - 911 pg/mL  Comprehensive metabolic panel  Result Value Ref Range   Sodium 141 135 -  145 mEq/L   Potassium 4.2 3.5 - 5.1 mEq/L   Chloride 105 96 - 112 mEq/L   CO2 32 19 - 32 mEq/L   Glucose, Bld 68 (L) 70 - 99 mg/dL   BUN 20 6 - 23 mg/dL   Creatinine, Ser 1.10 0.40 - 1.50 mg/dL   Total Bilirubin 0.5 0.2 - 1.2 mg/dL   Alkaline Phosphatase 93 39 - 117 U/L   AST 19 0 - 37 U/L   ALT 15 0 - 53 U/L   Total Protein 6.8 6.0 - 8.3 g/dL   Albumin 3.9 3.5 - 5.2 g/dL   Calcium 9.3 8.4 - 10.5 mg/dL   GFR 70.86 >60.00 mL/min  CBC with Differential  Result Value Ref Range   WBC 6.7 4.0 - 10.5 K/uL   RBC 4.71 4.22 - 5.81 Mil/uL   Hemoglobin 14.4 13.0 - 17.0 g/dL   HCT 42.8 39.0 - 52.0 %   MCV 90.9 78.0 - 100.0 fl   MCHC 33.8 30.0 - 36.0 g/dL   RDW 14.4 11.5 - 15.5 %   Platelets 183.0 150.0 -  400.0 K/uL   Neutrophils Relative % 60.5 43.0 - 77.0 %   Lymphocytes Relative 25.1 12.0 - 46.0 %   Monocytes Relative 9.4 3.0 - 12.0 %   Eosinophils Relative 4.1 0.0 - 5.0 %   Basophils Relative 0.9 0.0 - 3.0 %   Neutro Abs 4.0 1.4 - 7.7 K/uL   Lymphs Abs 1.7 0.7 - 4.0 K/uL   Monocytes Absolute 0.6 0.1 - 1.0 K/uL   Eosinophils Absolute 0.3 0.0 - 0.7 K/uL   Basophils Absolute 0.1 0.0 - 0.1 K/uL  TSH  Result Value Ref Range   TSH 0.57 0.35 - 4.50 uIU/mL      Assessment & Plan:   Problem List Items Addressed This Visit    Benign prostatic hyperplasia    Back on flomax.      Relevant Medications   tamsulosin (FLOMAX) 0.4 MG CAPS capsule   Hypersomnolence    On modafanil, followed by neurology Dr Rexene Alberts      Insomnia    Stable on ambien and melatonin.       Obesity, Class I, BMI 30-34.9   Relevant Orders   Lipid panel   Right knee pain - Primary    Presume meniscal injury, discussed likely degenerative. No MRI results available. Reviewed brace use, analgesics and ice/heat. Appreciate ortho care.       Vitamin B12 deficiency    Reports compliance with oral b12. Check levels next lab.       Relevant Orders   Vitamin B12    Other Visit Diagnoses    ANA positive       Relevant Orders   ANA       Follow up plan: Return in about 6 months (around 05/27/2016) for medicare wellness visit.  Ria Bush, MD

## 2015-11-27 NOTE — Progress Notes (Signed)
Pre visit review using our clinic review tool, if applicable. No additional management support is needed unless otherwise documented below in the visit note. 

## 2015-11-27 NOTE — Patient Instructions (Addendum)
Return fasting for labs at your convenience (ANA, B12, FLP).  Get flu shot at local pharmacy.  Continue brace for knee.  Return as needed or in 6 months for medicare wellness visit.

## 2015-11-28 DIAGNOSIS — M25561 Pain in right knee: Secondary | ICD-10-CM | POA: Insufficient documentation

## 2015-11-28 NOTE — Assessment & Plan Note (Signed)
On modafanil, followed by neurology Dr Rexene Alberts

## 2015-11-28 NOTE — Assessment & Plan Note (Signed)
Stable on ambien and melatonin.

## 2015-11-28 NOTE — Assessment & Plan Note (Signed)
Presume meniscal injury, discussed likely degenerative. No MRI results available. Reviewed brace use, analgesics and ice/heat. Appreciate ortho care.

## 2015-11-28 NOTE — Assessment & Plan Note (Signed)
Reports compliance with oral b12. Check levels next lab.

## 2015-11-28 NOTE — Assessment & Plan Note (Signed)
Back on flomax.

## 2015-12-01 ENCOUNTER — Other Ambulatory Visit (INDEPENDENT_AMBULATORY_CARE_PROVIDER_SITE_OTHER): Payer: Medicare Other

## 2015-12-01 DIAGNOSIS — R768 Other specified abnormal immunological findings in serum: Secondary | ICD-10-CM

## 2015-12-01 DIAGNOSIS — E669 Obesity, unspecified: Secondary | ICD-10-CM

## 2015-12-01 DIAGNOSIS — E538 Deficiency of other specified B group vitamins: Secondary | ICD-10-CM

## 2015-12-01 LAB — LIPID PANEL
CHOLESTEROL: 130 mg/dL (ref 0–200)
HDL: 47.3 mg/dL (ref >39.00)
LDL Cholesterol: 71 mg/dL (ref 0–99)
NonHDL: 82.72
TRIGLYCERIDES: 58 mg/dL (ref 0.0–149.0)
Total CHOL/HDL Ratio: 3
VLDL: 11.6 mg/dL (ref 0.0–40.0)

## 2015-12-01 LAB — VITAMIN B12: Vitamin B-12: 626 pg/mL (ref 211–911)

## 2015-12-02 LAB — ANTI-NUCLEAR AB-TITER (ANA TITER)

## 2015-12-02 LAB — ANA: Anti Nuclear Antibody(ANA): POSITIVE — AB

## 2015-12-03 LAB — INTRINSIC FACTOR ANTIBODIES: Intrinsic Factor: NEGATIVE

## 2015-12-04 DIAGNOSIS — Z23 Encounter for immunization: Secondary | ICD-10-CM | POA: Diagnosis not present

## 2015-12-06 ENCOUNTER — Other Ambulatory Visit: Payer: Self-pay | Admitting: Family Medicine

## 2015-12-06 DIAGNOSIS — R768 Other specified abnormal immunological findings in serum: Secondary | ICD-10-CM

## 2015-12-07 ENCOUNTER — Encounter: Payer: Self-pay | Admitting: *Deleted

## 2015-12-21 ENCOUNTER — Ambulatory Visit (INDEPENDENT_AMBULATORY_CARE_PROVIDER_SITE_OTHER): Payer: Medicare Other | Admitting: Podiatry

## 2015-12-21 DIAGNOSIS — M79676 Pain in unspecified toe(s): Secondary | ICD-10-CM

## 2015-12-21 DIAGNOSIS — L03039 Cellulitis of unspecified toe: Secondary | ICD-10-CM

## 2015-12-21 DIAGNOSIS — L600 Ingrowing nail: Secondary | ICD-10-CM | POA: Diagnosis not present

## 2015-12-21 DIAGNOSIS — S83241A Other tear of medial meniscus, current injury, right knee, initial encounter: Secondary | ICD-10-CM | POA: Diagnosis not present

## 2015-12-21 NOTE — Progress Notes (Signed)
Subjective: Patient presents today for evaluation of pain in her toe(s). Patient is concerned for possible ingrown nail. Patient states that the pain has been present for a few weeks now. Patient presents today for further treatment and evaluation.  Objective:  General: Well developed, nourished, in no acute distress, alert and oriented x3   Dermatology: Skin is warm, dry and supple bilateral. Right great toe medial border appears to be erythematous with evidence of an ingrowing nail. Purulent drainage noted with intruding nail into the respective nail fold. Pain on palpation noted to the border of the nail fold. The remaining nails appear unremarkable at this time. There are no open sores, lesions.  Vascular: Dorsalis Pedis artery and Posterior Tibial artery pedal pulses palpable. No lower extremity edema noted.   Neruologic: Grossly intact via light touch bilateral.  Musculoskeletal: Muscular strength within normal limits in all groups bilateral. Normal range of motion noted to all pedal and ankle joints.   Assesement: #1 paronychia right great toe medial border #2 ingrowing nail right great toe medial border #3 pain in right great toe   Plan of Care:  1. Patient evaluated.  2. Discussed treatment alternatives and plan of care. Explained nail avulsion procedure and post procedure course to patient. 3. Patient opted for permanent partial nail avulsion.  4. Prior to procedure, local anesthesia infiltration utilized using 3 ml of a 50:50 mixture of 2% plain lidocaine and 0.5% plain marcaine in a normal hallux block fashion and a betadine prep performed.  5. Partial permanent nail avulsion with chemical matrixectomy performed using [id number] applications of phenol followed by alcohol flush.  6. Light dressing applied. 7. Return to clinic in 2 weeks.   Dr. Edrick Kins [facility]

## 2016-01-04 ENCOUNTER — Ambulatory Visit (INDEPENDENT_AMBULATORY_CARE_PROVIDER_SITE_OTHER): Payer: Medicare Other | Admitting: Podiatry

## 2016-01-04 ENCOUNTER — Other Ambulatory Visit: Payer: Self-pay | Admitting: *Deleted

## 2016-01-04 ENCOUNTER — Encounter: Payer: Self-pay | Admitting: Podiatry

## 2016-01-04 DIAGNOSIS — L03039 Cellulitis of unspecified toe: Secondary | ICD-10-CM

## 2016-01-04 DIAGNOSIS — M79676 Pain in unspecified toe(s): Secondary | ICD-10-CM

## 2016-01-04 DIAGNOSIS — S91109D Unspecified open wound of unspecified toe(s) without damage to nail, subsequent encounter: Secondary | ICD-10-CM

## 2016-01-04 MED ORDER — CLINDAMYCIN HCL 300 MG PO CAPS: 300.0000 mg | ORAL_CAPSULE | Freq: Three times a day (TID) | ORAL | 0 refills | Status: DC

## 2016-01-04 NOTE — Telephone Encounter (Signed)
Received faxed refill request from pharmacy Last refill 05/28/15 Last office visit 11/27/15

## 2016-01-05 MED ORDER — ZOLPIDEM TARTRATE 5 MG PO TABS: 5.0000 mg | ORAL_TABLET | Freq: Every evening | ORAL | 1 refills | Status: DC | PRN

## 2016-01-05 NOTE — Progress Notes (Signed)
Subjective: Patient presents today 2 weeks post ingrown nail permanent nail avulsion procedure. Patient states that the toe and nail fold is feeling much better. Patient is scheduled to have knee surgery 02/11/2016. Patient is concerned that he needs to have his ingrown toenail cleared incompletely healed before surgery.  Objective: Skin is warm, dry and supple. Nail and respective nail fold appears to be healing appropriately. Open wound to the associated nail fold with a granular wound base and moderate amount of fibrotic tissue. Minimal drainage noted. Mild erythema around the periungual region likely due to phenol chemical matricectomy.  Assessment: #1 postop permanent partial nail avulsion #2 open wound periungual nail fold of respective digit.   Plan of care: #1 patient was evaluated  #2 debridement of open wound was performed to the periungual border of the respective toe using a currette. Antibiotic ointment and Band-Aid was applied. #3 today prescription for clindamycin 3 mg 3 times a day for empiric therapy of minimal drainage noted to the paronychia site  #4 patient is to return to clinic on a PRN  basis.

## 2016-01-05 NOTE — Telephone Encounter (Signed)
[facility] w/ confirmation.

## 2016-01-05 NOTE — Telephone Encounter (Signed)
Printed and in [name]'s box 

## 2016-02-20 ENCOUNTER — Encounter: Payer: Self-pay | Admitting: Neurology

## 2016-02-22 ENCOUNTER — Ambulatory Visit (INDEPENDENT_AMBULATORY_CARE_PROVIDER_SITE_OTHER): Payer: Medicare Other | Admitting: Adult Health

## 2016-02-22 ENCOUNTER — Ambulatory Visit (INDEPENDENT_AMBULATORY_CARE_PROVIDER_SITE_OTHER): Payer: Medicare Other | Admitting: Family Medicine

## 2016-02-22 ENCOUNTER — Encounter: Payer: Self-pay | Admitting: Family Medicine

## 2016-02-22 ENCOUNTER — Encounter: Payer: Self-pay | Admitting: Adult Health

## 2016-02-22 VITALS — BP 140/86 | HR 64 | Ht 67.0 in | Wt 214.6 lb

## 2016-02-22 VITALS — BP 130/82 | HR 72 | Temp 97.8°F | Wt 215.5 lb

## 2016-02-22 DIAGNOSIS — R400 Somnolence: Secondary | ICD-10-CM | POA: Diagnosis not present

## 2016-02-22 DIAGNOSIS — G4733 Obstructive sleep apnea (adult) (pediatric): Secondary | ICD-10-CM

## 2016-02-22 DIAGNOSIS — G8929 Other chronic pain: Secondary | ICD-10-CM

## 2016-02-22 DIAGNOSIS — M25561 Pain in right knee: Secondary | ICD-10-CM

## 2016-02-22 DIAGNOSIS — G4700 Insomnia, unspecified: Secondary | ICD-10-CM | POA: Diagnosis not present

## 2016-02-22 DIAGNOSIS — Z9989 Dependence on other enabling machines and devices: Secondary | ICD-10-CM

## 2016-02-22 DIAGNOSIS — R768 Other specified abnormal immunological findings in serum: Secondary | ICD-10-CM | POA: Diagnosis not present

## 2016-02-22 DIAGNOSIS — G4710 Hypersomnia, unspecified: Secondary | ICD-10-CM | POA: Diagnosis not present

## 2016-02-22 LAB — SEDIMENTATION RATE: SED RATE: 19 mm/h (ref 0–20)

## 2016-02-22 MED ORDER — METHOCARBAMOL 750 MG PO TABS: 750.0000 mg | ORAL_TABLET | Freq: Three times a day (TID) | ORAL | 1 refills | Status: DC | PRN

## 2016-02-22 NOTE — Patient Instructions (Signed)
Good to see you today.  Let me know how you do with upcoming arthroscopy.  Try to get back into aquatic exercise.

## 2016-02-22 NOTE — Assessment & Plan Note (Signed)
Reviewed sleep hygiene measures 

## 2016-02-22 NOTE — Progress Notes (Signed)
Pre visit review using our clinic review tool, if applicable. No additional management support is needed unless otherwise documented below in the visit note. 

## 2016-02-22 NOTE — Progress Notes (Addendum)
[name]: Gabriel [name] DOB: 05-10-1945  REASON FOR VISIT: follow up- OSA on CPAP HISTORY FROM: [name]  HISTORY OF PRESENT ILLNESS Today 02/22/16:Gabriel [name] is a 71 year old male with a history of obstructive sleep apnea on CPAP. Gabriel [name] returns today for a compliance download. Gabriel [name] download indicates that Gabriel [name] uses machine nightly for a compliance of 100%.. Gabriel [name] uses machine greater than 4 hours each night. On average Gabriel [name] uses Gabriel [name] machine 6 hours and 3 minutes. Gabriel [name] residual AHI is 1.1 on a pressure of 11 cm water with EPR 2. Gabriel [name] does have a high leak in Gabriel 95th percentile at 40 L/m. Reports that Gabriel [name] is using Gabriel nasal mask. Gabriel [name] changes  Gabriel [name] mask every 6 months. Gabriel [name] reports that Gabriel [name] continues to have trouble falling asleep. Gabriel [name] primary care gave Gabriel [name] Ambien. Reports Gabriel [name] is using Ambien and melatonin. Also continues to have daytime sleepiness but does get benefit with modafinil. Gabriel [name] reports that if Gabriel [name] eats a heavy lunch Gabriel [name] is often sleepy afterwards. Gabriel [name] also drinks sweet tea throughout Gabriel day. Gabriel [name] returns today for an evaluation.  HISTORY 08/18/15 per Dr. Guadelupe Sabin notes: Gabriel [name] is a very friendly 71 year old left-handed woman with an underlying medical history of arthritis of Gabriel left shoulder, degenerative disc disease, cervical radiculopathy, depression with anxiety, reflux disease, obstructive sleep apnea, who presents for follow-up consultation of Gabriel [name] OSA on CPAP with residual EDS. Gabriel [name] is accompanied by Gabriel [name] wife today. I last saw Gabriel [name] on 01/06/2015, at which time Gabriel [name] reported feeling fairly stable. Gabriel [name] was using Gabriel [name] CPAP regularly. Gabriel [name] needed new supplies. Gabriel [name] had ongoing stress. Gabriel [name] was planning to retire soon. Gabriel [name] was on generic Provigil 100 mg once or twice daily.    08/18/2015: I reviewed Gabriel [name] CPAP compliance data from 07/17/2015 2 08/15/2015 which is a total of 30 days during which time Gabriel [name] used Gabriel [name] machine every night with percent used days greater than 4 hours at 97%, indicating excellent  compliance with an average usage of 6 hours and 29 minutes, residual AHI 1.4 per hour, leak at times high with Gabriel 95th percentile at 25.4 L/m on a pressure of 11 cm with EPR 2.  Today, 08/18/2015: Gabriel [name] reports Bilateral shoulder pain, right knee pain, feeling stressed, feeling somewhat anxious and depressed at times, is on Wellbutrin for this. Gabriel [name] mother is in her 46s of in a nursing home, hospice is involved and Gabriel [name] sees her every day to help her eat, and Gabriel [name] also takes care of Gabriel [name] wife. Gabriel [name] has a biological son and an adopted son. 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 Gabriel [name] still tired during Gabriel day, Gabriel [name] is compliant with CPAP, does not always change Gabriel [name] supplies on time. Gabriel [name] is wondering if Gabriel [name] is eligible for a new machine. We will send an order for this to Gabriel [name] DME company and also CPAP supply order.  Previously:   Of note, Gabriel [name] no showed for an appointment on 12/24/2014 secondary to sickness. I saw Gabriel [name] on 06/19/2014, at which time Gabriel [name] reported compliance with treatment. Gabriel [name] was on 5 mg of Ambien each night and 5 mg of melatonin each night. Modafinil was marginally helpful. Gabriel [name] usually takes 1 in Gabriel morning and sometimes a second one in Gabriel afternoon. 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 Gabriel [name] reported bilateral shoulder pain. Gabriel [name] had to do a lot of  lifting at home. Gabriel [name] wife also needs help at night. This disrupts Gabriel [name] sleep.   I reviewed Gabriel [name] CPAP compliance data from 11/23/2014 through 12/22/2014 which is a total of 30 days during which time Gabriel [name] used Gabriel [name] machine every night with percent used days greater than 4 hours at 97%, indicating excellent compliance with an average usage of 6 hours and 8 minutes, residual AHI low at 1.1 per hour, leak acceptable with Gabriel 95th percentile at 16.2 L/m on a pressure of 11 cm with EPR of 2.  I saw Gabriel [name] on 12/18/2013, at which time Gabriel [name] reported having  trouble sleeping at night. Gabriel [name] felt sleepy during Gabriel day. Gabriel [name] was on Provigil 100 mg once daily. Gabriel [name] had more stress and more back pain. Gabriel [name] was worried about Gabriel [name] wife's health.  I reviewed Gabriel [name] CPAP compliance data from 05/18/2014 through 06/16/2014 which is a total of 30 days during which time Gabriel [name] used Gabriel [name] machine every night with percent used days greater than 4 hours at 100%, indicating superb compliance with an average usage of 6 hours and 33 minutes, residual AHI low at 1.2 per hour, leak however high, with Gabriel 95th percentile at 40.2 L/m on a pressure of 11 cm with EPR of 2.   I saw Gabriel [name] on 07/02/2013, at which time I suggested Gabriel [name] return for a full night CPAP study followed by a nap study Gabriel next day. Gabriel [name] was advised to taper off Gabriel [name] nortriptyline and Robaxin as well as stop Gabriel [name] Provigil 7-10 days prior to sleep testing. Gabriel [name] had a CPAP study on 08/27/2013. Sleep efficiency was 86.9%. Latency to sleep was 3.5 minutes. Wake after sleep onset was 55.5 minutes with moderate to severe sleep fragmentation noted. Gabriel [name] had an elevated arousal index at 15.2 per hour. Gabriel [name] had an increased percentage of stage 1 sleep and a mildly increased percentage of REM sleep at 26.1% with a mildly prolonged REM latency of 130.5 minutes. Gabriel [name] had frequent PVCs. Gabriel [name] was on CPAP at Gabriel usual pressure of 10 cm which was then increased to 11 and 12 cm. I felt Gabriel [name] did a little bit better on 11 cm and since it was not a big change from Gabriel [name] baseline treatment pressure we proceeded with Gabriel next day nap study. Gabriel [name] had an MSLT on 08/28/2013 with 5. Mean sleep latency was 10.4 with no REM onset naps. We called Gabriel [name] with Gabriel [name] test results on Gabriel phone. I did make an adjustment to Gabriel [name] CPAP pressure to 11 cm.  I reviewed Gabriel [name] compliance data from 11/16/2013 through 12/15/2013 which is a total of 30 days during which time Gabriel [name] used Gabriel [name] CPAP every night with percent used days greater than 4 hours of 97%, indicating excellent compliance, average usage of 6  hours and 17 minutes. Residual AHI at 1.2 per hour, leak at times was high with Gabriel 95th percentile at 47.3 L/m. Pressure at 11 cm with EPR of 2.  I saw Gabriel [name] on 01/14/13, at which time we talked about Gabriel [name] sleep study results and Gabriel [name] CPAP  compliance which was good. Gabriel [name] was well treated in terms of reduction of Gabriel [name] residual AHI. I suggested Gabriel [name] try Nuvigil as Gabriel [name] had some side effects in Gabriel past with Provigil. We also talked about potentially bringing Gabriel [name] back for further testing with MSLT down Gabriel [place]. In Gabriel interim, Gabriel [name] was seen by our NP, Ms. Lam, on 04/17/2013, at which time Gabriel [name] reported that Gabriel [name] could not afford Nuvigil and we decided to prescribe  Provigil again. Gabriel [name] requested to try it again. We also scheduled Gabriel [name] for a repeat sleep study followed by a nap study to further delineate and quantify Gabriel [name] sleepiness. I asked Gabriel [name] to taper off sedating medications with Gabriel help of Gabriel [name] primary care physician and also discontinue Gabriel [name] Provigil 7-10 days prior to Gabriel sleep studies. Gabriel [name] was given instructions by Gabriel [name] primary care physician to come off of Gabriel [name] muscle relaxer and nortriptyline. Gabriel [name] did not have Gabriel [name] sleep studies done and called to cancel those. I reviewed Gabriel [name] CPAP compliance down from 03/18/2013 through 04/16/2013 which is a total of 30 days during which time Gabriel [name] uses CPAP every night with 80% used days greater than 4 hours at 100%, indicating superb compliance with an average usage of 7 hours. Residual AHI was acceptable at 2.7 per hour and leak was acceptable at 20.1 L per minute at Gabriel 95th percentile.  I reviewed Gabriel [name] compliance data from 06/02/2013 through 07/01/2013 which is Gabriel last 30 days during which time Gabriel [name] was 100% compliant. Average usage was 6 hours and 39 minutes. Residual AHI low at 1 per hour, leak was a time high with Gabriel 95th percentile at 34.7 L per minute, pressure at 10 cm with EPR of 2.   I first met Gabriel [name] on 10/16/12, at which time, Gabriel [name] reported problems initiating and maintaining sleep and  ongoing severe EDS. Gabriel [name] has been taking Pamelor at bedtime (previously Elavil, but had to stop because of severe mouth dryness) as well as Ambien 5 mg at bedtime and has been on it for about 25 years. I recommended re-evaluation of Gabriel [name] OSA first with a split night sleep study. Gabriel [name] has this test on 11/21/2012 and I went over Gabriel [name] sleep study report in detail with Gabriel [name] previously: Gabriel [name] baseline sleep efficiency was reduced at 60.5% with a latency to sleep of 14 minutes and wake after sleep onset of 36 minutes with moderate to severe sleep fragmentation noted. Gabriel [name] had a markedly elevated arousal index at 96.5 per hour. There was a markedly increased percentage of stage I sleep, reduced percentage of stage II sleep, and absence of slow-wave sleep and REM sleep prior to CPAP. Gabriel [name] had periodic leg movements of 5.5 per hour. Gabriel [name] had frequent, unifocal-appearing PVCs. Gabriel [name] had mild to moderate snoring. Gabriel [name] AHI was highly elevated at 83.9 per hour. Gabriel [name] baseline oxygen saturation was 93%, Gabriel [name] nadir was 84% in non-REM sleep. Gabriel [name] was then titrated on CPAP. Gabriel [name] sleep efficiency was significantly increased and Gabriel [name] arousal index improved. Gabriel [name] had absence of slow wave sleep and 12.7% of REM sleep. Gabriel [name] average oxygen saturation was 93%, Gabriel [name] nadir was 82%. Gabriel [name] was titrated from 5-11 cm of water pressure and Gabriel [name] AHI was reduced to 0 events per hour at Gabriel pressure of 10. I also reviewed Gabriel [name] compliance data from Gabriel [name] machine: From 12/12/2012 through 01/13/2013 which is a total of 33 days during which she used CPAP every day. Gabriel [name] average usage was 6 hours and 44 minutes. Gabriel [name] residual AHI was 1.7 per hour on Gabriel pressure of 10 cm with EPR of 2. Gabriel [name] had a fairly high leak at times however.  Gabriel [name] is feeling better than before Gabriel sleep study. Gabriel [name] is compliant with treatment, likes Gabriel machine better and Gabriel mask as well. She feels, Gabriel [name] sleepiness is about Gabriel same, but Gabriel [name] feels better. Gabriel [name] may take a nap without CPAP. Gabriel [name] does not keep a very good sleep  schedule. Gabriel [name] uses Gabriel bathroom about once per  night.  Gabriel [name] was previously diagnosed with OSA in 1998 and was prescribed CPAP of 12 cm it indicated full compliance. I reviewed Gabriel [name] CPAP titration study from 01/24/2010 last time. Gabriel [name] AHI was reduced to 0 per hour and O2 nadir was 91% for Gabriel night. REM latency of 49 minutes, but this was a titration study, and could have shown REM rebound.  Gabriel [name] was tried on Provigil years ago, and it did not help and Gabriel [name] felt nervous and irritable on it. Gabriel [name] denies cataplexy, or hypogogic or hypnopompic hallucinations, but has had sleep paralysis. Gabriel [name] had an MSLT about 10 years ago, which showed no evidence of narcolepsy per [name].   REVIEW OF SYSTEMS: Out of a complete 14 system review of symptoms, Gabriel [name] complains only of Gabriel following symptoms, and all other reviewed systems are negative.  Cough, shortness of breath, runny nose, fatigue, excessive thirst, constipation, insomnia, apnea, daytime sleepiness, joint swelling, back pain, muscle cramps, urgency, frequency of urination, numbness, weakness, depression, nervous/anxious  ALLERGIES: Allergies  Allergen Reactions  . Amoxicillin Nausea And Vomiting  . Azithromycin Other (See Comments)    Wide spread body aches  . Doxycycline Other (See Comments)    "don't like how it makes me feel" ?nausea  . Erythromycin Nausea And Vomiting  . Sulfa Drugs Cross Reactors Other (See Comments)    Thrush    HOME MEDICATIONS: Outpatient Medications Prior to Visit  Medication Sig Dispense Refill  . acetaminophen (TYLENOL) 500 MG tablet Take 500 mg by mouth every 6 (six) hours as needed.    Marland Kitchen buPROPion (WELLBUTRIN XL) 300 MG 24 hr tablet Take one tablet daily 90 tablet 3  . HYDROcodone-acetaminophen (NORCO/VICODIN) 5-325 MG tablet Take 1 tablet by mouth as needed for moderate pain.    Marland Kitchen loratadine (CLARITIN) 10 MG tablet Take 10 mg by mouth daily as needed for allergies.    . Melatonin 5 MG CAPS Take 5 mg by mouth at  bedtime.     . meloxicam (MOBIC) 7.5 MG tablet Take 1 tablet (7.5 mg total) by mouth daily. ([name] taking differently: Take 7.5 mg by mouth as needed. ) 30 tablet 3  . methocarbamol (ROBAXIN) 750 MG tablet TAKE 1 TABLET THREE TIMES A DAY AS NEEDED ([name] taking differently: 2-3 times daily) 270 tablet 2  . modafinil (PROVIGIL) 100 MG tablet Take 1 tablet (100 mg total) by mouth 2 (two) times daily. 180 tablet 3  . omeprazole (PRILOSEC) 20 MG capsule TAKE 1 CAPSULE DAILY 90 capsule 3  . psyllium (REGULOID) 0.52 g capsule Take 0.52 g by mouth daily as needed.    . tamsulosin (FLOMAX) 0.4 MG CAPS capsule Take 0.4 mg by mouth daily after supper.    . vitamin B-12 (CYANOCOBALAMIN) 1000 MCG tablet Take 1,000 mcg by mouth daily.    Marland Kitchen zolpidem (AMBIEN) 5 MG tablet Take 1 tablet (5 mg total) by mouth at bedtime as needed for sleep. 90 tablet 1  . clindamycin (CLEOCIN) 300 MG capsule Take 1 capsule (300 mg total) by mouth 3 (three) times daily. 21 capsule 0   No facility-administered medications prior to visit.     PAST MEDICAL HISTORY: Past Medical History:  Diagnosis Date  . BPH (benign prostatic hypertrophy)    h/o prostate nodule, followed by Dr. Risa Grill yearly  . Cervical neck pain with evidence of disc disease    h/o bulging discs  . Chronic lower back pain    DDD, prior on lyrica, gabapentin, lumbar stenosis with persistent  numbness R ant thigh, ESI didn't help, now seeing [facility]  . Depression with anxiety    h/o remote hospitalization for depression  . GERD (gastroesophageal reflux disease)   . Hematuria - cause not known    s/p urology workup 2010, nl CT, cystoscopy (doesn't want rpt), cytology  . History of chicken pox   . History of colon polyps 2005   tubular adenoma, on rpt no more (2010)  . Insomnia    [place]  . OSA on CPAP    cpap machine at 10cm, ambien, daytime drowsiness attributed to OSA, last changed dextroamphetamine to nuvigil, previously tried provigil  . Prostate  nodule    followed by Risa Grill, reassuring exam and PSA  . Seronegative arthritis    RA, previously on MTX, RF <7, ANA nl (rheum Dr. Donnamarie Poag)  . Torn rotator cuff    bilateral, s/p L repair and now recurrent, chronic massive R RTC tear (supraspinatus)    PAST SURGICAL HISTORY: Past Surgical History:  Procedure Laterality Date  . ACHILLES TENDON REPAIR  2002  . CATARACT EXTRACTION  2007   left  . COLONOSCOPY  11/2008   diverticulosis, rpt 5 yrs  . COLONOSCOPY  07/2014   2 TA, mild diverticulosis, rpt 5 yrs [name])  . DOBUTAMINE STRESS ECHO  08/2006   negative for ischemia  . LUMBAR DISC SURGERY  07/2010   L3/4 diskectomy (Dr. Harl Bowie at [facility])  . MRI thoracic  09/2010   thoracic DDD/kyphosis, minimal anterolisthesis T2/3 with B foraminal narrowing  . ROTATOR CUFF REPAIR  2008   Left (Dr. Gaylyn Rong in [place], now [place])  . UPPER GASTROINTESTINAL ENDOSCOPY  02/2005   minimal GERD, small gastric polyp, tiny HH    FAMILY HISTORY: Family History  Problem Relation Age of Onset  . Arthritis Mother   . COPD Father     smoker  . Cancer Neg Hx   . Stroke Neg Hx   . Coronary artery disease Neg Hx   . Diabetes Neg Hx     SOCIAL HISTORY: Social History   Social History  . Marital status: Married    Spouse name: [name]  . Number of children: 2  . Years of education: Masters +   Occupational History  . Clergyman Other    [employer]   Social History Main Topics  . Smoking status: Never Smoker  . Smokeless tobacco: Never Used  . Alcohol use No  . Drug use: No  . Sexual activity: Yes   Other Topics Concern  . Not on file   Social History Narrative   Caffeine: avoids   [name] is married [name]) Lives with wife - who is in a power chair.   Occupation: Scientist, product/process development, Oceanographer   Edu: masters +   Likes to travel   Activity: no regular activity, to join Y   Diet: some fruits/vegetables   [name] has two children.   [name] is left-handed.       PHYSICAL EXAM  Vitals:   02/22/16 0948  BP: 140/86  Pulse: 64  Weight: 214 lb 9.6 oz (97.3 kg)  Height: 5' 7" (1.702 m)   Body mass index is 33.61 kg/m.  Generalized: Well developed, in no acute distress   Neurological examination  Mentation: Alert oriented to time, place, history taking. Follows all commands speech and language fluent Cranial nerve II-XII: Pupils were equal round reactive to light. Extraocular movements were full, visual field were full on confrontational test. Facial sensation and strength were normal.  Uvula tongue midline. Head turning and shoulder shrug  were normal and symmetric. Motor: Gabriel motor testing reveals 5 over 5 strength of all 4 extremities. Good symmetric motor tone is noted throughout.  Sensory: Sensory testing is intact to soft touch on all 4 extremities. No evidence of extinction is noted.  Coordination: Cerebellar testing reveals good finger-nose-finger and heel-to-shin bilaterally.  Gait and station: Gait is normal.  Reflexes: Deep tendon reflexes are symmetric and normal bilaterally.   DIAGNOSTIC DATA (LABS, IMAGING, TESTING) - I reviewed [name] records, labs, notes, testing and imaging myself where available.  Lab Results  Component Value Date   WBC 6.7 05/13/2015   HGB 14.4 05/13/2015   HCT 42.8 05/13/2015   MCV 90.9 05/13/2015   PLT 183.0 05/13/2015      Component Value Date/Time   NA 141 05/13/2015 0926   NA 140 11/25/2009   K 4.2 05/13/2015 0926   K 4.1 11/25/2009   CL 105 05/13/2015 0926   CO2 32 05/13/2015 0926   GLUCOSE 68 (L) 05/13/2015 0926   BUN 20 05/13/2015 0926   BUN 16 11/25/2009   CREATININE 1.10 05/13/2015 0926   CREATININE 1.01 04/12/2013 1551   CALCIUM 9.3 05/13/2015 0926   CALCIUM 8.9 11/25/2009   PROT 6.8 05/13/2015 0926   ALBUMIN 3.9 05/13/2015 0926   ALBUMIN 3.9 11/25/2009   AST 19 05/13/2015 0926   AST 26 11/25/2009   ALT 15 05/13/2015 0926   ALKPHOS 93 05/13/2015 0926   ALKPHOS 108  11/25/2009   BILITOT 0.5 05/13/2015 0926   BILITOT 0.9 11/25/2009   Lab Results  Component Value Date   CHOL 130 12/01/2015   HDL 47.30 12/01/2015   LDLCALC 71 12/01/2015   LDLDIRECT 84 02/22/2008   TRIG 58.0 12/01/2015   CHOLHDL 3 12/01/2015    Lab Results  Component Value Date   VITAMINB12 626 12/01/2015   Lab Results  Component Value Date   TSH 0.57 05/13/2015      ASSESSMENT AND PLAN 71 y.o. year old male  has a past medical history of BPH (benign prostatic hypertrophy); Cervical neck pain with evidence of disc disease; Chronic lower back pain; Depression with anxiety; GERD (gastroesophageal reflux disease); Hematuria - cause not known; History of chicken pox; History of colon polyps (2005); Insomnia; OSA on CPAP; Prostate nodule; Seronegative arthritis; and Torn rotator cuff. here with:  1. Obstructive sleep apnea on CPAP 2. Daytime sleepiness  [name]'s download shows excellent compliance and treatment of Gabriel [name] apnea. However Gabriel [name] does have a significant leak in Gabriel 95th percentile. Gabriel [name] will have a mask refitting today. I discussed with Gabriel [name] techniques to improve and promote good sleep hygiene. Such as eliminating Gabriel TV at bedtime, balanced diet and limiting caffeine intake. [name] verbalized understanding. Gabriel [name] will follow-up in 6 months or sooner if needed.     Ward Givens, MSN, NP-C 02/22/2016, 10:11 AM [facility] [address] [place], [place] [address] [phone]  I reviewed Gabriel above note and documentation by Gabriel Nurse Practitioner and agree with Gabriel history, physical exam, assessment and plan as outlined above. I was immediately available for face-to-face consultation. Star Age, MD, PhD [facility])

## 2016-02-22 NOTE — Patient Instructions (Signed)
Continue using CPAP nightly  Mask refitting today If your symptoms worsen or you develop new symptoms please let us know.

## 2016-02-22 NOTE — Assessment & Plan Note (Addendum)
OSA related, on modafanil.

## 2016-02-22 NOTE — Assessment & Plan Note (Signed)
Per pt MRI showed meniscal injury - anticipate degenerative meniscal tear + arthritis. Has decided to undergo arthroscopy with Dr Mardelle Matte.

## 2016-02-22 NOTE — Assessment & Plan Note (Signed)
Found latest labwork. Will update autoimmune labs.

## 2016-02-22 NOTE — Progress Notes (Signed)
BP 130/82   Pulse 72   Temp 97.8 F (36.6 C) (Oral)   Wt 215 lb 8 oz (97.8 kg)   BMI 33.75 kg/m    CC: R knee pain Subjective:    Patient ID: [name], male    DOB: [birth year], 71 y.o.   MRN: [id number]  HPI: FAVOUR COTA is a 71 y.o. male presenting on 02/22/2016 for Knee Pain (right)   Mother passed away end of Dec 11, 2022.   See prior note for details - ongoing R knee pain (MCL + meniscal tear + arthritis). Rec against surgery by Dr Mardelle Matte. Received steroid injection to R knee 12/11/2015, has been using brace as well. Treating with meloxicam and hydrocodone. No locking or instability. Planning R knee arthroscopy and meniscal repair 03/10/2016 with Dr Mardelle Matte.   Worsening trouble standing. Ongoing R knee pain with radiation up and down leg as well as some numbness of R leg.   Wanted my opinion on upcoming surgery.   Recently positive ANA, homogeneous 1:160  Relevant past medical, surgical, family and social history reviewed and updated as indicated. Interim medical history since our last visit reviewed. Allergies and medications reviewed and updated. Current Outpatient Prescriptions on File Prior to Visit  Medication Sig  . acetaminophen (TYLENOL) 500 MG tablet Take 500 mg by mouth every 6 (six) hours as needed.  Marland Kitchen buPROPion (WELLBUTRIN XL) 300 MG 24 hr tablet Take one tablet daily  . HYDROcodone-acetaminophen (NORCO/VICODIN) 5-325 MG tablet Take 1 tablet by mouth as needed for moderate pain.  Marland Kitchen loratadine (CLARITIN) 10 MG tablet Take 10 mg by mouth daily as needed for allergies.  . Melatonin 5 MG CAPS Take 5 mg by mouth at bedtime.   . meloxicam (MOBIC) 7.5 MG tablet Take 1 tablet (7.5 mg total) by mouth daily. (Patient taking differently: Take 7.5 mg by mouth as needed. )  . modafinil (PROVIGIL) 100 MG tablet Take 1 tablet (100 mg total) by mouth 2 (two) times daily.  Marland Kitchen omeprazole (PRILOSEC) 20 MG capsule TAKE 1 CAPSULE DAILY  . psyllium (REGULOID) 0.52 g capsule Take 0.52  g by mouth daily as needed.  . vitamin B-12 (CYANOCOBALAMIN) 1000 MCG tablet Take 1,000 mcg by mouth daily.  Marland Kitchen zolpidem (AMBIEN) 5 MG tablet Take 1 tablet (5 mg total) by mouth at bedtime as needed for sleep. (Patient taking differently: Take 5 mg by mouth at bedtime. )   No current facility-administered medications on file prior to visit.     Review of Systems Per HPI unless specifically indicated in ROS section     Objective:    BP 130/82   Pulse 72   Temp 97.8 F (36.6 C) (Oral)   Wt 215 lb 8 oz (97.8 kg)   BMI 33.75 kg/m   Wt Readings from Last 3 Encounters:  02/22/16 215 lb 8 oz (97.8 kg)  02/22/16 214 lb 9.6 oz (97.3 kg)  11/27/15 219 lb 12 oz (99.7 kg)    Physical Exam  Constitutional: He appears well-developed and well-nourished. No distress.  Musculoskeletal: He exhibits no edema.  FROM bilateral knees without significant crepitus Tender at R knee first few steps  Tender to palpation R knee medial joint line Positive seated mcmurray's   Nursing note and vitals reviewed.  Results for orders placed or performed in visit on 12/01/15  Intrinsic Factor Antibodies  Result Value Ref Range   Intrinsic Factor Negative Negative  Lipid panel  Result Value Ref Range   Cholesterol 130  0 - 200 mg/dL   Triglycerides 58.0 0.0 - 149.0 mg/dL   HDL 47.30 >39.00 mg/dL   VLDL 11.6 0.0 - 40.0 mg/dL   LDL Cholesterol 71 0 - 99 mg/dL   Total CHOL/HDL Ratio 3    NonHDL 82.72   Vitamin B12  Result Value Ref Range   Vitamin B-12 626 211 - 911 pg/mL  ANA  Result Value Ref Range   Anit Nuclear Antibody(ANA) POS (A) NEGATIVE  Anti-nuclear ab-titer (ANA titer)  Result Value Ref Range   ANA Pattern 1 HOMOGENEOUS (A)    ANA Titer 1 1:160 (H) titer      Assessment & Plan:   Problem List Items Addressed This Visit    Hypersomnolence    OSA related, on modafanil.       Insomnia    Reviewed sleep hygiene measures.       OSA (obstructive sleep apnea)    Just seen by neuro  APP - planned change in nasal mask. Appreciate their care.       Positive ANA (antinuclear antibody)    Found latest labwork. Will update autoimmune labs.       Right knee pain - Primary    Per pt MRI showed meniscal injury - anticipate degenerative meniscal tear + arthritis. Has decided to undergo arthroscopy with Dr Mardelle Matte.           Follow up plan: No Follow-up on file.  Ria Bush, MD

## 2016-02-22 NOTE — Assessment & Plan Note (Signed)
Just seen by neuro APP - planned change in nasal mask. Appreciate their care.

## 2016-02-23 ENCOUNTER — Encounter: Payer: Self-pay | Admitting: Sports Medicine

## 2016-02-23 ENCOUNTER — Ambulatory Visit (INDEPENDENT_AMBULATORY_CARE_PROVIDER_SITE_OTHER): Payer: Medicare Other | Admitting: Sports Medicine

## 2016-02-23 DIAGNOSIS — M79674 Pain in right toe(s): Secondary | ICD-10-CM

## 2016-02-23 DIAGNOSIS — L601 Onycholysis: Secondary | ICD-10-CM

## 2016-02-23 DIAGNOSIS — Z9889 Other specified postprocedural states: Secondary | ICD-10-CM

## 2016-02-23 LAB — RHEUMATOID FACTOR: Rhuematoid fact SerPl-aCnc: <14 IU/mL (ref <14)

## 2016-02-23 LAB — ANTI-DNA ANTIBODY, DOUBLE-STRANDED

## 2016-02-23 LAB — RNP ANTIBODY: Ribonucleic Protein(ENA) Antibody, IgG: <1

## 2016-02-23 NOTE — Progress Notes (Signed)
Subjective: Gabriel [name] is a 71 y.o. male patient returns to office today for follow up evaluation after having Right hallux permanent nail avulsion performed 12/2015. Patient states that he is to have Right Knee surgery and reports that part of the nail is lifting and wanted to make sure that it wasn't infected so that he can have his surgery as scheduled next month.   Patient Active Problem List   Diagnosis Date Noted  . Positive ANA (antinuclear antibody) 02/22/2016  . Right knee pain 11/28/2015  . External hemorrhoid 07/13/2015  . Hearing loss of right ear due to cerumen impaction 05/28/2015  . Obesity, Class I, BMI 30-34.9 05/28/2015  . Benign prostatic hyperplasia   . Medicare annual wellness visit, initial 05/13/2015  . Vitamin B12 deficiency 04/13/2013  . Fatigue 04/12/2013  . Left shoulder pain 09/10/2012  . DDD (degenerative disc disease), lumbar 12/06/2011  . Right cervical radiculopathy 08/31/2011  . Acquired deformity of nail 08/13/2011  . Healthcare maintenance 06/04/2011  . Insomnia 03/04/2011  . Right shoulder pain 01/06/2011  . Chronic lower back pain   . Hypersomnolence 12/02/2010  . Hematuria - cause not known   . Depression with anxiety   . GERD (gastroesophageal reflux disease)   . OSA (obstructive sleep apnea)   . Torn rotator cuff   . Colon polyp     Current Outpatient Prescriptions on File Prior to Visit  Medication Sig Dispense Refill  . acetaminophen (TYLENOL) 500 MG tablet Take 500 mg by mouth every 6 (six) hours as needed.    Marland Kitchen buPROPion (WELLBUTRIN XL) 300 MG 24 hr tablet Take one tablet daily 90 tablet 3  . HYDROcodone-acetaminophen (NORCO/VICODIN) 5-325 MG tablet Take 1 tablet by mouth as needed for moderate pain.    Marland Kitchen loratadine (CLARITIN) 10 MG tablet Take 10 mg by mouth daily as needed for allergies.    . Melatonin 5 MG CAPS Take 5 mg by mouth at bedtime.     . meloxicam (MOBIC) 7.5 MG tablet Take 1 tablet (7.5 mg total) by mouth daily.  (Patient taking differently: Take 7.5 mg by mouth as needed. ) 30 tablet 3  . methocarbamol (ROBAXIN) 750 MG tablet Take 1 tablet (750 mg total) by mouth 3 (three) times daily as needed. 270 tablet 1  . modafinil (PROVIGIL) 100 MG tablet Take 1 tablet (100 mg total) by mouth 2 (two) times daily. 180 tablet 3  . omeprazole (PRILOSEC) 20 MG capsule TAKE 1 CAPSULE DAILY 90 capsule 3  . psyllium (REGULOID) 0.52 g capsule Take 0.52 g by mouth daily as needed.    . tamsulosin (FLOMAX) 0.4 MG CAPS capsule Take 0.4 mg by mouth at bedtime.    . vitamin B-12 (CYANOCOBALAMIN) 1000 MCG tablet Take 1,000 mcg by mouth daily.    Marland Kitchen zolpidem (AMBIEN) 5 MG tablet Take 1 tablet (5 mg total) by mouth at bedtime as needed for sleep. (Patient taking differently: Take 5 mg by mouth at bedtime. ) 90 tablet 1   No current facility-administered medications on file prior to visit.     Allergies  Allergen Reactions  . Amoxicillin Nausea And Vomiting  . Azithromycin Other (See Comments)    Wide spread body aches  . Doxycycline Other (See Comments)    "don't like how it makes me feel" ?nausea  . Erythromycin Nausea And Vomiting  . Sulfa Drugs Cross Reactors Other (See Comments)    Thrush    Objective:  General: Well developed, nourished, in no acute  distress, alert and oriented x3   Dermatology: Skin is warm, dry and supple bilateral. Right hallux  nail bed appears to be clean, dry there is lifting of the distal and medial edge of the nail with No Erythema. No Edema. No serosanguous drainage present. The remaining nails appear unremarkable at this time. There are no other lesions or other signs of infection  present.  Neurovascular status: Intact. No lower extremity swelling; No pain with calf compression bilateral.  Musculoskeletal: No tenderness to palpation of the Right hallux nail fold(s). Muscular strength within normal limits bilateral. Right knee brace in place.  Assesement and Plan: Problem List Items  Addressed This Visit    None    Visit Diagnoses    S/P nail surgery    -  Primary   Onycholysis       Toe pain, right         -Examined patient  -Trimmed loose free nail away without incident; there was no signs of infection present; No contraindication to right knee surgery. -Educated patient on long term care after nail surgery. -Patient was instructed to monitor the toe for reoccurrence and signs of infection; Patient advised to return to office or go to ER if toe becomes red, hot or swollen. -Patient is to return as needed or sooner if problems arise.  Landis Martins, DPM

## 2016-02-26 ENCOUNTER — Encounter: Payer: Self-pay | Admitting: *Deleted

## 2016-03-01 ENCOUNTER — Telehealth: Payer: Self-pay | Admitting: Family Medicine

## 2016-03-01 NOTE — Telephone Encounter (Signed)
Letter mailed notifying patient. Patient also notified of results.

## 2016-03-01 NOTE — Telephone Encounter (Signed)
Pt called to get lab results from 1/15. He is requesting a call when available.

## 2016-03-03 ENCOUNTER — Telehealth: Payer: Self-pay | Admitting: Neurology

## 2016-03-03 DIAGNOSIS — Z9989 Dependence on other enabling machines and devices: Secondary | ICD-10-CM

## 2016-03-03 DIAGNOSIS — G4733 Obstructive sleep apnea (adult) (pediatric): Secondary | ICD-10-CM

## 2016-03-03 DIAGNOSIS — G4719 Other hypersomnia: Secondary | ICD-10-CM

## 2016-03-03 MED ORDER — MODAFINIL 100 MG PO TABS: 100.0000 mg | ORAL_TABLET | Freq: Two times a day (BID) | ORAL | 3 refills | Status: DC

## 2016-03-03 NOTE — Telephone Encounter (Signed)
Rx faxed in. I called patient and let him know.

## 2016-03-03 NOTE — Addendum Note (Signed)
Addended by: Laurence Spates on: 03/03/2016 09:28 AM   Modules accepted: Orders

## 2016-03-03 NOTE — Telephone Encounter (Signed)
I called [facility] and confirmed that they do not have any further refills on file. I will print Rx and fax once signed by Dr. Rexene Alberts.

## 2016-03-03 NOTE — Telephone Encounter (Signed)
Patient called requesting refill for modafinil (PROVIGIL) 100 MG tablet.  [facility] per patient.  Please call to advise request has been sent per patient.

## 2016-03-10 DIAGNOSIS — M958 Other specified acquired deformities of musculoskeletal system: Secondary | ICD-10-CM | POA: Diagnosis not present

## 2016-03-10 DIAGNOSIS — S83241A Other tear of medial meniscus, current injury, right knee, initial encounter: Secondary | ICD-10-CM | POA: Diagnosis not present

## 2016-03-10 DIAGNOSIS — M23221 Derangement of posterior horn of medial meniscus due to old tear or injury, right knee: Secondary | ICD-10-CM | POA: Diagnosis not present

## 2016-03-10 DIAGNOSIS — M2341 Loose body in knee, right knee: Secondary | ICD-10-CM | POA: Diagnosis not present

## 2016-03-16 ENCOUNTER — Telehealth: Payer: Self-pay | Admitting: Family Medicine

## 2016-03-16 NOTE — Telephone Encounter (Signed)
Patient Name: Gabriel [name] DOB: [birth year] Initial Comment Caller states he had knee surgery a week ago, he feels weak and tired. Blowing his nose. Sore throat. No fever. Dry throat. Nurse Assessment Nurse: Zorita Pang, RN, Deborah Date/Time (Eastern Time): 03/16/2016 11:04:56 AM Confirm and document reason for call. If symptomatic, describe symptoms. ---Caller states that he had a knee arthroscopy last Thursday. He has had a runny nose and weak. He states that he has Narco. He states that his throat is very dry and he has a stuffy nose. Does the patient have any new or worsening symptoms? ---Yes Will a triage be completed? ---Yes Related visit to physician within the last 2 weeks? ---Yes Does the PT have any chronic conditions? (i.e. diabetes, asthma, etc.) ---No Is this a behavioral health or substance abuse call? ---No Guidelines Guideline Title Affirmed Question Affirmed Notes Common Cold [1] Nasal discharge AND [2] present > 10 days Final Disposition User See PCP When Office is Open (within 3 days) Zorita Pang, RN, [employer] not there. Person who answered gave cell number of [phone]. Will try that number. Called patient on cell but the reception was not good. Will call back at home phone number in about 10 minutes. The caller was instructed how to make nasal washes and how to do them and he verbalizes understanding. Disagree/Comply: Comply

## 2016-03-22 DIAGNOSIS — H01003 Unspecified blepharitis right eye, unspecified eyelid: Secondary | ICD-10-CM | POA: Diagnosis not present

## 2016-03-23 DIAGNOSIS — M2341 Loose body in knee, right knee: Secondary | ICD-10-CM | POA: Diagnosis not present

## 2016-03-23 DIAGNOSIS — S83241D Other tear of medial meniscus, current injury, right knee, subsequent encounter: Secondary | ICD-10-CM | POA: Diagnosis not present

## 2016-04-25 DIAGNOSIS — S83241D Other tear of medial meniscus, current injury, right knee, subsequent encounter: Secondary | ICD-10-CM | POA: Diagnosis not present

## 2016-04-25 DIAGNOSIS — M2341 Loose body in knee, right knee: Secondary | ICD-10-CM | POA: Diagnosis not present

## 2016-04-26 ENCOUNTER — Ambulatory Visit: Payer: Medicare Other | Attending: Orthopedic Surgery

## 2016-04-26 DIAGNOSIS — M25561 Pain in right knee: Secondary | ICD-10-CM | POA: Insufficient documentation

## 2016-04-26 DIAGNOSIS — R2689 Other abnormalities of gait and mobility: Secondary | ICD-10-CM | POA: Diagnosis not present

## 2016-04-26 DIAGNOSIS — G8929 Other chronic pain: Secondary | ICD-10-CM | POA: Diagnosis not present

## 2016-04-26 DIAGNOSIS — M25661 Stiffness of right knee, not elsewhere classified: Secondary | ICD-10-CM | POA: Diagnosis not present

## 2016-04-26 NOTE — Therapy (Signed)
[place] MAIN [facility] SERVICES [address] [place], [place], [address] Phone: [phone]   Fax:  [phone]  Physical Therapy Evaluation  Patient Details  Name: Gabriel [name] MRN: [id number] Date of Birth: [birth year] No Data Recorded  Encounter Date: 04/26/2016      PT End of Session - 04/26/16 1256    Visit Number 1   Number of Visits 12   Date for PT Re-Evaluation 06/07/16   Authorization Type G Code (1/10)   PT Start Time 2376   PT Stop Time 1145   PT Time Calculation (min) 65 min   Activity Tolerance Patient tolerated treatment well   Behavior During Therapy [facility] for tasks assessed/performed      Past Medical History:  Diagnosis Date  . BPH (benign prostatic hypertrophy)    h/o prostate nodule, followed by Dr. Risa Grill yearly  . Cervical neck pain with evidence of disc disease    h/o bulging discs  . Chronic lower back pain    DDD, prior on lyrica, gabapentin, lumbar stenosis with persistent numbness R ant thigh, ESI didn't help, now seeing [facility]  . Depression with anxiety    h/o remote hospitalization for depression  . GERD (gastroesophageal reflux disease)   . Hematuria - cause not known    s/p urology workup 2010, nl CT, cystoscopy (doesn't want rpt), cytology  . History of chicken pox   . History of colon polyps 2005   tubular adenoma, on rpt no more (2010)  . Insomnia    [place]  . OSA on CPAP    cpap machine at 10cm, ambien, daytime drowsiness attributed to OSA, last changed dextroamphetamine to nuvigil, previously tried provigil  . Prostate nodule    followed by Risa Grill, reassuring exam and PSA  . Seronegative arthritis    RA, previously on MTX, RF <7, ANA nl (rheum Dr. Donnamarie Poag)  . Torn rotator cuff    bilateral, s/p L repair and now recurrent, chronic massive R RTC tear (supraspinatus)    Past Surgical History:  Procedure Laterality Date  . ACHILLES TENDON REPAIR  2002  . CATARACT EXTRACTION  2007   left  .  COLONOSCOPY  11/2008   diverticulosis, rpt 5 yrs  . COLONOSCOPY  07/2014   2 TA, mild diverticulosis, rpt 5 yrs [name])  . DOBUTAMINE STRESS ECHO  08/2006   negative for ischemia  . LUMBAR DISC SURGERY  07/2010   L3/4 diskectomy (Dr. Harl Bowie at [facility])  . MRI thoracic  09/2010   thoracic DDD/kyphosis, minimal anterolisthesis T2/3 with B foraminal narrowing  . ROTATOR CUFF REPAIR  2008   Left (Dr. Gaylyn Rong in [place], now [place])  . UPPER GASTROINTESTINAL ENDOSCOPY  02/2005   minimal GERD, small gastric polyp, tiny HH    There were no vitals filed for this visit.       Subjective Assessment - 04/26/16 1053    Subjective Patient reports increased right knee pain since novemeber 2016 after tripping over an item and landing on his knee. Patient reports s/p knee scope menisectomy & debridement he and had a 2018 feb 1st surgery. Patient states increased difficulty in walking and standing for prolonged periods of time. 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 Patient demonstrates a worse pain of a 7/10 in the past week and the best pain being a 1/10.    Pertinent History sleep apnea; previous achilles tendon repair on the L LE,    Limitations Standing;Walking  How long can you stand comfortably? 77min   How long can you walk comfortably? 54min   Patient Stated Goals walking longer and standing without pain.   Currently in Pain? Yes   Pain Score 5    Pain Location Knee   Pain Orientation Right   Pain Descriptors / Indicators Aching;Sharp   Pain Onset More than a month ago   Pain Frequency Constant   Aggravating Factors  standing, walking for prolonged periods of time            [facility] PT Assessment - 04/26/16 1048      Assessment   Medical Diagnosis R knee scope and pain   Onset Date/Surgical Date 03/10/16   Hand Dominance Left   Next MD Visit 06/27/2016   Prior Therapy none     Precautions   Precautions Fall     Restrictions   Weight  Bearing Restrictions No     Balance Screen   Has the patient fallen in the past 6 months No   Has the patient had a decrease in activity level because of a fear of falling?  Yes   Is the patient reluctant to leave their home because of a fear of falling?  No     Home Environment   Living Environment Private residence   Living Arrangements Spouse/significant other   Available Help at Discharge Friend(s);Family   Type of [place] entrance   Home Layout Two level   Alternate Level Stairs-Number of Steps 13   Alternate Level Stairs-Rails Can reach both   Home Equipment [place] - single point     Prior Function   Level of Independence Independent   Vocation Full time employment   Systems analyst, prologed standing   Leisure international traveling      Cognition   Overall Cognitive Status Within Functional Limits for tasks assessed     ROM / Strength   AROM / PROM / Strength AROM;Strength     AROM   AROM Assessment Site Knee   Right/Left Knee Right;Left   Right Knee Extension -3   Right Knee Flexion 110   Left Knee Extension 0   Left Knee Flexion 135     Strength   Strength Assessment Site Hip;Knee;Ankle   Right/Left Hip Right;Left   Right Hip Flexion 4/5   Right Hip External Rotation  4-/5   Right Hip ABduction 4-/5   Right Hip ADduction 4-/5   Left Hip Flexion 4/5   Left Hip Extension 4+/5   Left Hip External Rotation 4+/5   Left Hip ABduction 4/5   Left Hip ADduction 4-/5   Right/Left Knee Right;Left   Right Knee Flexion 4-/5   Right Knee Extension 4-/5   Left Knee Flexion 4-/5   Left Knee Extension 5/5   Right/Left Ankle Right;Left   Right Ankle Dorsiflexion 5/5   Left Ankle Dorsiflexion 5/5     Ambulation/Gait   Gait Pattern Decreased stance time - right;Decreased step length - left;Decreased weight shift to left   Gait Comments Antalgic gait: decreased weight shifitng onto R side     Standardized Balance Assessment    Standardized Balance Assessment 10 meter walk test   10 Meter Walk .19m/s     Obsevation: LEFS: 41/80  Palpation:  Joint line tenderness over the anterior medial aspect of the right knee, increased spasms and pain aong the quad and hamstrings on the R knee.   Patellar mobilizations: Medial direction demonstrates decreased  mobility compared to L LE.  TREATMENT:  Sidelying clamshells -- x20 SLR with bent knee in supine -- x 20 Weight shifting in standing ant/post in retrostep -- x 20 Seated ball roll outs in sitting -- x 3 min        PT Education - 2016-05-03 1254    Education provided Yes   Education Details HEP: clamshells, SLR, retrostep weight shifts, ball rolls in sitting   Person(s) Educated Patient   Methods Explanation;Demonstration   Comprehension Verbalized understanding;Returned demonstration             PT Long Term Goals - 05/03/2016 1307      PT LONG TERM GOAL #1   Title Pt will be independent with HEP to continue with benefits of therapy after discharge from physical therapy.    Baseline Dependent with exercise form/technique   Time 6   Period Weeks   Status New     PT LONG TERM GOAL #2   Title Pt will score 60/80 on the LEFS to demonstrate significant improvement in LE function and greater ability to walk without feeling fatigue.    Baseline 41/80   Time 6   Period Weeks   Status New     PT LONG TERM GOAL #3   Title Patient will score a 0/10 on the VAS as the worst pain in the past week to demonstrate improvement in knee function and pain.    Baseline 7/10    Time 6   Period Weeks   Status New     PT LONG TERM GOAL #4   Title Patient will score >1.34m/s on 100m walk test to demonstrate signifcant improvement in  gait speed and decrease in fall risk.    Baseline .73m/s   Time 6   Period Weeks   Status New               Plan - 05/03/2016 1257    Clinical Impression Statement Patient in a 71 yo left hand dominant male presenting with  increased R knee pain S/p menisectomy and debridement on 03/10/16. Patient demonstrates increased knee dysfunction as indicated by decreased gait speed, decreased LEFS score, increased pain with movement, and decreased MMT/AROM of right knee. Patient demonstrates slight extensor lags in the right knee and will benefit from furthers skilled therapy focused on improving knee/LE motor control, strength and AROM to return to prior level of function.    Rehab Potential Good   Clinical Impairments Affecting Rehab Potential (+) highly motivated, family support; (-) previous achilles tear, chronic knee pain   PT Frequency 2x / week   PT Duration 6 weeks   PT Treatment/Interventions Passive range of motion;Dry needling;Manual techniques;Balance training;Therapeutic exercise;Therapeutic activities;Patient/family education;Stair training;Gait training;Neuromuscular re-education;Iontophoresis 4mg /ml Dexamethasone;Moist Heat;Ultrasound;Electrical Stimulation   PT Next Visit Plan Progress strengthening, decreasing extensor lag, and quad control   Consulted and Agree with Plan of Care Patient      Patient will benefit from skilled therapeutic intervention in order to improve the following deficits and impairments:  Abnormal gait, Increased fascial restricitons, Pain, Improper body mechanics, Decreased mobility, Increased muscle spasms, Decreased range of motion, Decreased endurance, Decreased strength, Decreased activity tolerance, Decreased balance, Difficulty walking  Visit Diagnosis: Chronic pain of right knee  Stiffness of right knee, not elsewhere classified  Other abnormalities of gait and mobility      G-Codes - 03-May-2016 1348    Functional Assessment Tool Used (Outpatient Only) LEFS, VAS, 26mwt, MMT, clinical judgement   Functional Limitation Mobility:  Walking and moving around   Mobility: Walking and Moving Around Current Status [phone]) At least 20 percent but less than 40 percent impaired, limited  or restricted   Mobility: Walking and Moving Around Goal Status [phone]) At least 1 percent but less than 20 percent impaired, limited or restricted       Problem List Patient Active Problem List   Diagnosis Date Noted  . Positive ANA (antinuclear antibody) 02/22/2016  . Right knee pain 11/28/2015  . External hemorrhoid 07/13/2015  . Hearing loss of right ear due to cerumen impaction 05/28/2015  . Obesity, Class I, BMI 30-34.9 05/28/2015  . Benign prostatic hyperplasia   . Medicare annual wellness visit, initial 05/13/2015  . Vitamin B12 deficiency 04/13/2013  . Fatigue 04/12/2013  . Left shoulder pain 09/10/2012  . DDD (degenerative disc disease), lumbar 12/06/2011  . Right cervical radiculopathy 08/31/2011  . Acquired deformity of nail 08/13/2011  . Healthcare maintenance 06/04/2011  . Insomnia 03/04/2011  . Right shoulder pain 01/06/2011  . Chronic lower back pain   . Hypersomnolence 12/02/2010  . Hematuria - cause not known   . Depression with anxiety   . GERD (gastroesophageal reflux disease)   . OSA (obstructive sleep apnea)   . Torn rotator cuff   . Colon polyp     Blythe Stanford, PT DPT 04/26/2016, 1:51 PM  [place] MAIN [facility] SERVICES [address] [place], [place], [address] Phone: [phone]   Fax:  [phone]  Name: BOBBI KOZAKIEWICZ MRN: [id number] Date of Birth: February 15, 1945

## 2016-04-27 ENCOUNTER — Ambulatory Visit: Payer: Medicare Other

## 2016-04-28 ENCOUNTER — Encounter: Payer: Self-pay | Admitting: Physical Therapy

## 2016-04-28 ENCOUNTER — Ambulatory Visit: Payer: Medicare Other | Admitting: Physical Therapy

## 2016-04-28 DIAGNOSIS — M25661 Stiffness of right knee, not elsewhere classified: Secondary | ICD-10-CM

## 2016-04-28 DIAGNOSIS — M25561 Pain in right knee: Principal | ICD-10-CM

## 2016-04-28 DIAGNOSIS — R2689 Other abnormalities of gait and mobility: Secondary | ICD-10-CM

## 2016-04-28 DIAGNOSIS — G8929 Other chronic pain: Secondary | ICD-10-CM

## 2016-04-28 NOTE — Therapy (Addendum)
[place] MAIN [facility] SERVICES [address] [place], [place], [address] Phone: [phone]   Fax:  [phone]  Physical Therapy Treatment  Patient Details  Name: Gabriel [name] MRN: [id number] Date of Birth: September 10, 1945 No Data Recorded  Encounter Date: 04/28/2016      PT End of Session - 04/28/16 4166    Visit Number 2   Number of Visits 12   Date for PT Re-Evaluation 06/07/16   Authorization Type G Code (2/10)   PT Start Time 0915   PT Stop Time 0155   PT Time Calculation (min) 1000 min   Activity Tolerance Patient tolerated treatment well;Patient limited by pain;Patient limited by fatigue   Behavior During Therapy [facility] for tasks assessed/performed      Past Medical History:  Diagnosis Date  . BPH (benign prostatic hypertrophy)    h/o prostate nodule, followed by Dr. Risa Grill yearly  . Cervical neck pain with evidence of disc disease    h/o bulging discs  . Chronic lower back pain    DDD, prior on lyrica, gabapentin, lumbar stenosis with persistent numbness R ant thigh, ESI didn't help, now seeing [facility]  . Depression with anxiety    h/o remote hospitalization for depression  . GERD (gastroesophageal reflux disease)   . Hematuria - cause not known    s/p urology workup 2010, nl CT, cystoscopy (doesn't want rpt), cytology  . History of chicken pox   . History of colon polyps 2005   tubular adenoma, on rpt no more (2010)  . Insomnia    Dohmeier  . OSA on CPAP    cpap machine at 10cm, ambien, daytime drowsiness attributed to OSA, last changed dextroamphetamine to nuvigil, previously tried provigil  . Prostate nodule    followed by Risa Grill, reassuring exam and PSA  . Seronegative arthritis    RA, previously on MTX, RF <7, ANA nl (rheum Dr. Donnamarie Poag)  . Torn rotator cuff    bilateral, s/p L repair and now recurrent, chronic massive R RTC tear (supraspinatus)    Past Surgical History:  Procedure Laterality Date  . ACHILLES TENDON  REPAIR  2002  . CATARACT EXTRACTION  2007   left  . COLONOSCOPY  11/2008   diverticulosis, rpt 5 yrs  . COLONOSCOPY  07/2014   2 TA, mild diverticulosis, rpt 5 yrs [name])  . DOBUTAMINE STRESS ECHO  08/2006   negative for ischemia  . LUMBAR DISC SURGERY  07/2010   L3/4 diskectomy (Dr. Harl Bowie at [facility])  . MRI thoracic  09/2010   thoracic DDD/kyphosis, minimal anterolisthesis T2/3 with B foraminal narrowing  . ROTATOR CUFF REPAIR  2008   Left (Dr. Gaylyn Rong in [place], now [place])  . UPPER GASTROINTESTINAL ENDOSCOPY  02/2005   minimal GERD, small gastric polyp, tiny HH    There were no vitals filed for this visit.      Subjective Assessment - 04/28/16 0920    Subjective Patient is doing well with 2/10 pain in right knee. The longer he stands up or the longer he stands up the more it hurts.    Pertinent History sleep apnea; previous achilles tendon repair on the L LE,    Limitations Standing;Walking   How long can you stand comfortably? 63min   How long can you walk comfortably? 10min   Patient Stated Goals walking longer and standing without pain.   Currently in Pain? Yes   Pain Score 2    Pain Location Knee   Pain Orientation  Right   Pain Descriptors / Indicators Aching   Pain Type Chronic pain   Pain Onset More than a month ago   Pain Frequency Constant   Aggravating Factors  standing and walking   Pain Relieving Factors ice ,   Effect of Pain on Daily Activities cant walk as long   Multiple Pain Sites No     Therapeutic exercises:  RLE SLR with intervals with 30, 45, 60 deg x 10  BLE Hip abd sidelying x 10 x 2  BLE Hip add sidelying x10 x 2  Step ups from floor to 6 inch stool x 20 Lunges into BOSU ball x 10 Mini squats with 3 sec hold x 10 3 way hip x 15 BLE Leg press x 20 x 3  90  lbs SAQ with 3 sec hold x 20 Eccentric step down from 2 inch stool x 10  AROM -5 deg 135 deg right knee No increased pain today with exercises. Patient needs cues for posture and  technique.                              PT Education - 04/28/16 [phone]    Education provided Yes   Education Details HEP    Person(s) Educated Patient   Methods Explanation   Comprehension Verbalized understanding             PT Long Term Goals - 04/26/16 1307      PT LONG TERM GOAL #1   Title Pt will be independent with HEP to continue with benefits of therapy after discharge from physical therapy.    Baseline Dependent with exercise form/technique   Time 6   Period Weeks   Status New     PT LONG TERM GOAL #2   Title Pt will score 60/80 on the LEFS to demonstrate significant improvement in LE function and greater ability to walk without feeling fatigue.    Baseline 41/80   Time 6   Period Weeks   Status New     PT LONG TERM GOAL #3   Title Patient will score a 0/10 on the VAS as the worst pain in the past week to demonstrate improvement in knee function and pain.    Baseline 7/10    Time 6   Period Weeks   Status New     PT LONG TERM GOAL #4   Title Patient will score >1.13m/s on 74m walk test to demonstrate signifcant improvement in  gait speed and decrease in fall risk.    Baseline .71m/s   Time 6   Period Weeks   Status New               Plan - 04/28/16 6213    Clinical Impression Statement Patient instructed in RLE strengthening exercises closed chain and open chain with some pain behaviors. Patient has decreased ROM to right knee with extension lag. He will continue to benefit form skilled PT to improve strength and ROM and ambulate wihtout pain.    Rehab Potential Good   Clinical Impairments Affecting Rehab Potential (+) highly motivated, family support; (-) previous achilles tear, chronic knee pain   PT Frequency 2x / week   PT Duration 6 weeks   PT Treatment/Interventions Passive range of motion;Dry needling;Manual techniques;Balance training;Therapeutic exercise;Therapeutic activities;Patient/family education;Stair  training;Gait training;Neuromuscular re-education;Iontophoresis 4mg /ml Dexamethasone;Moist Heat;Ultrasound;Electrical Stimulation   PT Next Visit Plan Progress strengthening, decreasing extensor lag, and quad control  Consulted and Agree with Plan of Care Patient      Patient will benefit from skilled therapeutic intervention in order to improve the following deficits and impairments:  Abnormal gait, Increased fascial restricitons, Pain, Improper body mechanics, Decreased mobility, Increased muscle spasms, Decreased range of motion, Decreased endurance, Decreased strength, Decreased activity tolerance, Decreased balance, Difficulty walking  Visit Diagnosis: Chronic pain of right knee  Stiffness of right knee, not elsewhere classified  Other abnormalities of gait and mobility     Problem List Patient Active Problem List   Diagnosis Date Noted  . Positive ANA (antinuclear antibody) 02/22/2016  . Right knee pain 11/28/2015  . External hemorrhoid 07/13/2015  . Hearing loss of right ear due to cerumen impaction 05/28/2015  . Obesity, Class I, BMI 30-34.9 05/28/2015  . Benign prostatic hyperplasia   . Medicare annual wellness visit, initial 05/13/2015  . Vitamin B12 deficiency 04/13/2013  . Fatigue 04/12/2013  . Left shoulder pain 09/10/2012  . DDD (degenerative disc disease), lumbar 12/06/2011  . Right cervical radiculopathy 08/31/2011  . Acquired deformity of nail 08/13/2011  . Healthcare maintenance 06/04/2011  . Insomnia 03/04/2011  . Right shoulder pain 01/06/2011  . Chronic lower back pain   . Hypersomnolence 12/02/2010  . Hematuria - cause not known   . Depression with anxiety   . GERD (gastroesophageal reflux disease)   . OSA (obstructive sleep apnea)   . Torn rotator cuff   . Colon polyp    Alanson Puls, PT, DPT Arelia Sneddon S 04/28/2016, 9:30 AM  [place] MAIN [facility] SERVICES [address] [place],  [place], [address] Phone: [phone]   Fax:  [phone]  Name: Gabriel [name] MRN: [id number] Date of Birth: [birth year]

## 2016-04-30 ENCOUNTER — Other Ambulatory Visit: Payer: Self-pay | Admitting: Family Medicine

## 2016-05-03 ENCOUNTER — Ambulatory Visit: Payer: Medicare Other

## 2016-05-03 DIAGNOSIS — M25661 Stiffness of right knee, not elsewhere classified: Secondary | ICD-10-CM | POA: Diagnosis not present

## 2016-05-03 DIAGNOSIS — R2689 Other abnormalities of gait and mobility: Secondary | ICD-10-CM | POA: Diagnosis not present

## 2016-05-03 DIAGNOSIS — G8929 Other chronic pain: Secondary | ICD-10-CM | POA: Diagnosis not present

## 2016-05-03 DIAGNOSIS — M25561 Pain in right knee: Secondary | ICD-10-CM | POA: Diagnosis not present

## 2016-05-03 NOTE — Therapy (Signed)
[place] MAIN [facility] SERVICES [address] [place], [place], [address] Phone: [phone]   Fax:  [phone]  Physical Therapy Treatment  Patient Details  Name: Gabriel [name] MRN: [id number] Date of Birth: [birth year] No Data Recorded  Encounter Date: 05/03/2016      PT End of Session - 05/03/16 1736    Visit Number 3   Number of Visits 12   Date for PT Re-Evaluation 06/07/16   Authorization Type G Code (3/10)   PT Start Time 1645   PT Stop Time 1730   PT Time Calculation (min) 45 min   Activity Tolerance Patient tolerated treatment well;Patient limited by fatigue;Patient limited by pain   Behavior During Therapy [facility] for tasks assessed/performed      Past Medical History:  Diagnosis Date  . BPH (benign prostatic hypertrophy)    h/o prostate nodule, followed by Dr. Risa Grill yearly  . Cervical neck pain with evidence of disc disease    h/o bulging discs  . Chronic lower back pain    DDD, prior on lyrica, gabapentin, lumbar stenosis with persistent numbness R ant thigh, ESI didn't help, now seeing [facility]  . Depression with anxiety    h/o remote hospitalization for depression  . GERD (gastroesophageal reflux disease)   . Hematuria - cause not known    s/p urology workup 2010, nl CT, cystoscopy (doesn't want rpt), cytology  . History of chicken pox   . History of colon polyps 2005   tubular adenoma, on rpt no more (2010)  . Insomnia    Dohmeier  . OSA on CPAP    cpap machine at 10cm, ambien, daytime drowsiness attributed to OSA, last changed dextroamphetamine to nuvigil, previously tried provigil  . Prostate nodule    followed by Risa Grill, reassuring exam and PSA  . Seronegative arthritis    RA, previously on MTX, RF <7, ANA nl (rheum Dr. Donnamarie Poag)  . Torn rotator cuff    bilateral, s/p L repair and now recurrent, chronic massive R RTC tear (supraspinatus)    Past Surgical History:  Procedure Laterality Date  . ACHILLES TENDON  REPAIR  2002  . CATARACT EXTRACTION  2007   left  . COLONOSCOPY  11/2008   diverticulosis, rpt 5 yrs  . COLONOSCOPY  07/2014   2 TA, mild diverticulosis, rpt 5 yrs [name])  . DOBUTAMINE STRESS ECHO  08/2006   negative for ischemia  . LUMBAR DISC SURGERY  07/2010   L3/4 diskectomy (Dr. Harl Bowie at [facility])  . MRI thoracic  09/2010   thoracic DDD/kyphosis, minimal anterolisthesis T2/3 with B foraminal narrowing  . ROTATOR CUFF REPAIR  2008   Left (Dr. Gaylyn Rong in [place], now [place])  . UPPER GASTROINTESTINAL ENDOSCOPY  02/2005   minimal GERD, small gastric polyp, tiny HH    There were no vitals filed for this visit.      Subjective Assessment - 05/03/16 1652    Subjective Patient reports he's weaning off the brace and states he's still getting used to not bearing the brace. Patient reports the exercises do not exaccerbate his pain.    Pertinent History sleep apnea; previous achilles tendon repair on the L LE,    Limitations Standing;Walking   How long can you stand comfortably? 62min   How long can you walk comfortably? 69min   Patient Stated Goals walking longer and standing without pain.   Currently in Pain? No/denies   Pain Onset More than a month ago  TREATMENT: Manual Therapy: STM along quadriceps and hamstring musculature to decrease muscle spasms and pain. Performed patellar mobilizations focusing on ant/post; and medial/lateral - 3 x 30sec; P->A mobilizations to R knee Grade III to improve movement 2 x 30sec Therapeutic exercises: Nustep - seat level 11 with level 2 resistance - 10min RLE SLR with cueing on maintaining quadriceps activation - 2 x 10  Leg press - with focus on improving quadriceps strength -- 20 x 2; 105# Quad Sets 5 sec holds - 2 x 10 in long sitting Retrostep with focus on maintaining straight leg positioning throughout motion - x15 Step ups from floor to 8 inch stool -- x 30 Lunges onto BOSU ball -- x 20 in  bars       PT Education - 05/03/16  1736    Education provided Yes   Education Details form/technique with exercises   Person(s) Educated Patient   Methods Explanation;Demonstration   Comprehension Returned demonstration;Verbalized understanding             PT Long Term Goals - 04/26/16 1307      PT LONG TERM GOAL #1   Title Pt will be independent with HEP to continue with benefits of therapy after discharge from physical therapy.    Baseline Dependent with exercise form/technique   Time 6   Period Weeks   Status New     PT LONG TERM GOAL #2   Title Pt will score 60/80 on the LEFS to demonstrate significant improvement in LE function and greater ability to walk without feeling fatigue.    Baseline 41/80   Time 6   Period Weeks   Status New     PT LONG TERM GOAL #3   Title Patient will score a 0/10 on the VAS as the worst pain in the past week to demonstrate improvement in knee function and pain.    Baseline 7/10    Time 6   Period Weeks   Status New     PT LONG TERM GOAL #4   Title Patient will score >1.58m/s on 25m walk test to demonstrate signifcant improvement in  gait speed and decrease in fall risk.    Baseline .48m/s   Time 6   Period Weeks   Status New               Plan - 05/03/16 1737    Clinical Impression Statement Focused on improving knee extension to decrease extensor lag and patient demonstrates neutral knee positioning after manual therapy and therapeutic exercise. Patient continues to demonstrate increased knee pain and decreased stabilization to the R knee and will benefit from further skilled therapy to return to prior level of function.    Rehab Potential Good   Clinical Impairments Affecting Rehab Potential (+) highly motivated, family support; (-) previous achilles tear, chronic knee pain   PT Frequency 2x / week   PT Duration 6 weeks   PT Treatment/Interventions Passive range of motion;Dry needling;Manual techniques;Balance training;Therapeutic exercise;Therapeutic  activities;Patient/family education;Stair training;Gait training;Neuromuscular re-education;Iontophoresis 4mg /ml Dexamethasone;Moist Heat;Ultrasound;Electrical Stimulation   PT Next Visit Plan Progress strengthening, decreasing extensor lag, and quad control   Consulted and Agree with Plan of Care Patient      Patient will benefit from skilled therapeutic intervention in order to improve the following deficits and impairments:  Abnormal gait, Increased fascial restricitons, Pain, Improper body mechanics, Decreased mobility, Increased muscle spasms, Decreased range of motion, Decreased endurance, Decreased strength, Decreased activity tolerance, Decreased balance, Difficulty walking  Visit Diagnosis:  Chronic pain of right knee  Stiffness of right knee, not elsewhere classified  Other abnormalities of gait and mobility     Problem List Patient Active Problem List   Diagnosis Date Noted  . Positive ANA (antinuclear antibody) 02/22/2016  . Right knee pain 11/28/2015  . External hemorrhoid 07/13/2015  . Hearing loss of right ear due to cerumen impaction 05/28/2015  . Obesity, Class I, BMI 30-34.9 05/28/2015  . Benign prostatic hyperplasia   . Medicare annual wellness visit, initial 05/13/2015  . Vitamin B12 deficiency 04/13/2013  . Fatigue 04/12/2013  . Left shoulder pain 09/10/2012  . DDD (degenerative disc disease), lumbar 12/06/2011  . Right cervical radiculopathy 08/31/2011  . Acquired deformity of nail 08/13/2011  . Healthcare maintenance 06/04/2011  . Insomnia 03/04/2011  . Right shoulder pain 01/06/2011  . Chronic lower back pain   . Hypersomnolence 12/02/2010  . Hematuria - cause not known   . Depression with anxiety   . GERD (gastroesophageal reflux disease)   . OSA (obstructive sleep apnea)   . Torn rotator cuff   . Colon polyp     Blythe Stanford, PT DPT 05/03/2016, 5:39 PM  [place] MAIN [facility] SERVICES [address]  [place], [place], [address] Phone: [phone]   Fax:  [phone]  Name: Gabriel [name] MRN: [id number] Date of Birth: Dec 09, 1945

## 2016-05-05 ENCOUNTER — Ambulatory Visit: Payer: Medicare Other

## 2016-05-05 DIAGNOSIS — M25661 Stiffness of right knee, not elsewhere classified: Secondary | ICD-10-CM

## 2016-05-05 DIAGNOSIS — R2689 Other abnormalities of gait and mobility: Secondary | ICD-10-CM | POA: Diagnosis not present

## 2016-05-05 DIAGNOSIS — M25561 Pain in right knee: Secondary | ICD-10-CM | POA: Diagnosis not present

## 2016-05-05 DIAGNOSIS — G8929 Other chronic pain: Secondary | ICD-10-CM | POA: Diagnosis not present

## 2016-05-05 NOTE — Therapy (Signed)
[place] MAIN [facility] SERVICES [address] [place], [place], [address] Phone: [phone]   Fax:  [phone]  Physical Therapy Treatment  Patient Details  Name: Gabriel [name] MRN: [id number] Date of Birth: [birth year] No Data Recorded  Encounter Date: 05/05/2016      PT End of Session - 05/05/16 0908    Visit Number 4   Number of Visits 12   Date for PT Re-Evaluation 06/07/16   Authorization Type G Code (4/10)   PT Start Time 0900   PT Stop Time 0945   PT Time Calculation (min) 45 min   Activity Tolerance Patient tolerated treatment well;Patient limited by fatigue;Patient limited by pain   Behavior During Therapy [facility] for tasks assessed/performed      Past Medical History:  Diagnosis Date  . BPH (benign prostatic hypertrophy)    h/o prostate nodule, followed by Dr. Risa Grill yearly  . Cervical neck pain with evidence of disc disease    h/o bulging discs  . Chronic lower back pain    DDD, prior on lyrica, gabapentin, lumbar stenosis with persistent numbness R ant thigh, ESI didn't help, now seeing [facility]  . Depression with anxiety    h/o remote hospitalization for depression  . GERD (gastroesophageal reflux disease)   . Hematuria - cause not known    s/p urology workup 2010, nl CT, cystoscopy (doesn't want rpt), cytology  . History of chicken pox   . History of colon polyps 2005   tubular adenoma, on rpt no more (2010)  . Insomnia    Dohmeier  . OSA on CPAP    cpap machine at 10cm, ambien, daytime drowsiness attributed to OSA, last changed dextroamphetamine to nuvigil, previously tried provigil  . Prostate nodule    followed by Risa Grill, reassuring exam and PSA  . Seronegative arthritis    RA, previously on MTX, RF <7, ANA nl (rheum Dr. Donnamarie Poag)  . Torn rotator cuff    bilateral, s/p L repair and now recurrent, chronic massive R RTC tear (supraspinatus)    Past Surgical History:  Procedure Laterality Date  . ACHILLES TENDON  REPAIR  2002  . CATARACT EXTRACTION  2007   left  . COLONOSCOPY  11/2008   diverticulosis, rpt 5 yrs  . COLONOSCOPY  07/2014   2 TA, mild diverticulosis, rpt 5 yrs [name])  . DOBUTAMINE STRESS ECHO  08/2006   negative for ischemia  . LUMBAR DISC SURGERY  07/2010   L3/4 diskectomy (Dr. Harl Bowie at [facility])  . MRI thoracic  09/2010   thoracic DDD/kyphosis, minimal anterolisthesis T2/3 with B foraminal narrowing  . ROTATOR CUFF REPAIR  2008   Left (Dr. Gaylyn Rong in [place], now [place])  . UPPER GASTROINTESTINAL ENDOSCOPY  02/2005   minimal GERD, small gastric polyp, tiny HH    There were no vitals filed for this visit.      Subjective Assessment - 05/05/16 0906    Subjective Patient reports he's doing well and states he has not been wearing the brace. Patient reports no increased soreness after the previous visit.    Pertinent History sleep apnea; previous achilles tendon repair on the L LE,    Limitations Standing;Walking   How long can you stand comfortably? 35min   How long can you walk comfortably? 63min   Patient Stated Goals walking longer and standing without pain.   Currently in Pain? Yes   Pain Score 3    Pain Location Knee   Pain Orientation Right  Pain Descriptors / Indicators Aching   Pain Type Chronic pain   Pain Onset More than a month ago   Pain Frequency Constant      TREATMENT: Manual Therapy: STM along quadriceps and hamstring musculature to decrease muscle spasms and pain. Performed patellar mobilizations focusing on ant/post; and medial/lateral - 3 x 30sec; P->A mobilizations to R knee Grade III to improve movement 2 x 30sec  Therapeutic exercises: Nustep - seat level 11 with level 4 resistance - 48min RLE SLR with cueing on maintaining quadriceps activation - 2 x 10  Quad Sets 5 sec holds - 2 x 10 in long sitting Leg press - with focus on improving quadriceps strength -- 20 x 2; 45# unilateral Retrostep with focus on maintaining straight leg positioning  throughout motion - x15 B Heel taps from 6" steps - with UE support 2 x 12 TKE in stand against double black band - x20       PT Education - 05/05/16 0908    Education provided Yes   Education Details Form/technique with exercises   Person(s) Educated Patient   Methods Explanation;Demonstration   Comprehension Verbalized understanding;Returned demonstration             PT Long Term Goals - 04/26/16 1307      PT LONG TERM GOAL #1   Title Pt will be independent with HEP to continue with benefits of therapy after discharge from physical therapy.    Baseline Dependent with exercise form/technique   Time 6   Period Weeks   Status New     PT LONG TERM GOAL #2   Title Pt will score 60/80 on the LEFS to demonstrate significant improvement in LE function and greater ability to walk without feeling fatigue.    Baseline 41/80   Time 6   Period Weeks   Status New     PT LONG TERM GOAL #3   Title Patient will score a 0/10 on the VAS as the worst pain in the past week to demonstrate improvement in knee function and pain.    Baseline 7/10    Time 6   Period Weeks   Status New     PT LONG TERM GOAL #4   Title Patient will score >1.63m/s on 36m walk test to demonstrate signifcant improvement in  gait speed and decrease in fall risk.    Baseline .64m/s   Time 6   Period Weeks   Status New               Plan - 05/05/16 8546    Clinical Impression Statement Continued to focus on improving extension lag to improve walking ability and knee pain in standing. Patient demonstrates improved knee positioning after performance of manual therapy and patient will benefit from further skilled therapy to return to prior level of function.    Rehab Potential Good   Clinical Impairments Affecting Rehab Potential (+) highly motivated, family support; (-) previous achilles tear, chronic knee pain   PT Frequency 2x / week   PT Duration 6 weeks   PT Treatment/Interventions Passive range of  motion;Dry needling;Manual techniques;Balance training;Therapeutic exercise;Therapeutic activities;Patient/family education;Stair training;Gait training;Neuromuscular re-education;Iontophoresis 4mg /ml Dexamethasone;Moist Heat;Ultrasound;Electrical Stimulation   PT Next Visit Plan Progress strengthening, decreasing extensor lag, and quad control   Consulted and Agree with Plan of Care Patient      Patient will benefit from skilled therapeutic intervention in order to improve the following deficits and impairments:  Abnormal gait, Increased fascial restricitons, Pain, Improper  body mechanics, Decreased mobility, Increased muscle spasms, Decreased range of motion, Decreased endurance, Decreased strength, Decreased activity tolerance, Decreased balance, Difficulty walking  Visit Diagnosis: Chronic pain of right knee  Stiffness of right knee, not elsewhere classified  Other abnormalities of gait and mobility     Problem List Patient Active Problem List   Diagnosis Date Noted  . Positive ANA (antinuclear antibody) 02/22/2016  . Right knee pain 11/28/2015  . External hemorrhoid 07/13/2015  . Hearing loss of right ear due to cerumen impaction 05/28/2015  . Obesity, Class I, BMI 30-34.9 05/28/2015  . Benign prostatic hyperplasia   . Medicare annual wellness visit, initial 05/13/2015  . Vitamin B12 deficiency 04/13/2013  . Fatigue 04/12/2013  . Left shoulder pain 09/10/2012  . DDD (degenerative disc disease), lumbar 12/06/2011  . Right cervical radiculopathy 08/31/2011  . Acquired deformity of nail 08/13/2011  . Healthcare maintenance 06/04/2011  . Insomnia 03/04/2011  . Right shoulder pain 01/06/2011  . Chronic lower back pain   . Hypersomnolence 12/02/2010  . Hematuria - cause not known   . Depression with anxiety   . GERD (gastroesophageal reflux disease)   . OSA (obstructive sleep apnea)   . Torn rotator cuff   . Colon polyp     Blythe Stanford, PT DPT 05/05/2016, 9:56  AM  [place] MAIN [facility] SERVICES [address] [place], [place], [address] Phone: [phone]   Fax:  [phone]  Name: LIBERO PUTHOFF MRN: [id number] Date of Birth: 24-Apr-1945

## 2016-05-12 ENCOUNTER — Ambulatory Visit: Payer: Medicare Other | Attending: Orthopedic Surgery

## 2016-05-12 DIAGNOSIS — M25661 Stiffness of right knee, not elsewhere classified: Secondary | ICD-10-CM | POA: Diagnosis not present

## 2016-05-12 DIAGNOSIS — R2689 Other abnormalities of gait and mobility: Secondary | ICD-10-CM | POA: Insufficient documentation

## 2016-05-12 DIAGNOSIS — M25561 Pain in right knee: Secondary | ICD-10-CM | POA: Insufficient documentation

## 2016-05-12 DIAGNOSIS — G8929 Other chronic pain: Secondary | ICD-10-CM

## 2016-05-12 NOTE — Therapy (Signed)
[place] MAIN [facility] SERVICES [address] [place], [place], [address] Phone: [phone]   Fax:  [phone]  Physical Therapy Treatment  Patient Details  Name: ADEMIDE SCHABERG MRN: [id number] Date of Birth: May 27, 1945 No Data Recorded  Encounter Date: 05/12/2016      PT End of Session - 05/12/16 1352    Visit Number 5   Number of Visits 12   Date for PT Re-Evaluation 06/07/16   Authorization Type G Code (5/10)   PT Start Time 1300   PT Stop Time 1345   PT Time Calculation (min) 45 min   Activity Tolerance Patient tolerated treatment well;Patient limited by fatigue;Patient limited by pain   Behavior During Therapy [facility] for tasks assessed/performed      Past Medical History:  Diagnosis Date  . BPH (benign prostatic hypertrophy)    h/o prostate nodule, followed by Dr. Risa Grill yearly  . Cervical neck pain with evidence of disc disease    h/o bulging discs  . Chronic lower back pain    DDD, prior on lyrica, gabapentin, lumbar stenosis with persistent numbness R ant thigh, ESI didn't help, now seeing [facility]  . Depression with anxiety    h/o remote hospitalization for depression  . GERD (gastroesophageal reflux disease)   . Hematuria - cause not known    s/p urology workup 2010, nl CT, cystoscopy (doesn't want rpt), cytology  . History of chicken pox   . History of colon polyps 2005   tubular adenoma, on rpt no more (2010)  . Insomnia    Dohmeier  . OSA on CPAP    cpap machine at 10cm, ambien, daytime drowsiness attributed to OSA, last changed dextroamphetamine to nuvigil, previously tried provigil  . Prostate nodule    followed by Risa Grill, reassuring exam and PSA  . Seronegative arthritis    RA, previously on MTX, RF <7, ANA nl (rheum Dr. Donnamarie Poag)  . Torn rotator cuff    bilateral, s/p L repair and now recurrent, chronic massive R RTC tear (supraspinatus)    Past Surgical History:  Procedure Laterality Date  . ACHILLES TENDON  REPAIR  2002  . CATARACT EXTRACTION  2007   left  . COLONOSCOPY  11/2008   diverticulosis, rpt 5 yrs  . COLONOSCOPY  07/2014   2 TA, mild diverticulosis, rpt 5 yrs [name])  . DOBUTAMINE STRESS ECHO  08/2006   negative for ischemia  . LUMBAR DISC SURGERY  07/2010   L3/4 diskectomy (Dr. Harl Bowie at [facility])  . MRI thoracic  09/2010   thoracic DDD/kyphosis, minimal anterolisthesis T2/3 with B foraminal narrowing  . ROTATOR CUFF REPAIR  2008   Left (Dr. Gaylyn Rong in [place], now [place])  . UPPER GASTROINTESTINAL ENDOSCOPY  02/2005   minimal GERD, small gastric polyp, tiny HH    There were no vitals filed for this visit.      Subjective Assessment - 05/12/16 1306    Subjective Patient reports he is a little sore today [place].    Pertinent History sleep apnea; previous achilles tendon repair on the L LE,    Limitations Standing;Walking   How long can you stand comfortably? 27min   How long can you walk comfortably? 73min   Patient Stated Goals walking longer and standing without pain.   Currently in Pain? No/denies   Pain Orientation --   Pain Onset More than a month ago      TREATMENT: Manual Therapy: STM along quadriceps and  hamstring musculature to decrease muscle spasms and pain. Performed patellar mobilizations focusing on ant/post; and medial/lateral - 3 x 30sec; P->A mobilizations & A->P to R knee Grade III to improve movement 2 x 30sec   Therapeutic exercises: Nustep - seat level 11 with level 4 resistance - 33min RLE SLR with cueing on maintaining quadriceps activation - 2 x 20  Quad Sets 5 sec holds - 2 x 10 in long sitting Sit to stands with L foot out in front - 2 x10 Side stepping with GTB - 2 x 63ft Heel walking -- 2 x 29ft Heel curls with GTB in sitting - x20 B Stairs performance up/down without railing - x20 Step up and down skipping the first step - x 20  TKE in stand against double black band - x20      PT Education - 05/12/16 1352     Education provided Yes   Education Details HEP: sit to stands with L foot out in front   Person(s) Educated Patient   Methods Explanation;Demonstration   Comprehension Verbalized understanding;Returned demonstration             PT Long Term Goals - 04/26/16 1307      PT LONG TERM GOAL #1   Title Pt will be independent with HEP to continue with benefits of therapy after discharge from physical therapy.    Baseline Dependent with exercise form/technique   Time 6   Period Weeks   Status New     PT LONG TERM GOAL #2   Title Pt will score 60/80 on the LEFS to demonstrate significant improvement in LE function and greater ability to walk without feeling fatigue.    Baseline 41/80   Time 6   Period Weeks   Status New     PT LONG TERM GOAL #3   Title Patient will score a 0/10 on the VAS as the worst pain in the past week to demonstrate improvement in knee function and pain.    Baseline 7/10    Time 6   Period Weeks   Status New     PT LONG TERM GOAL #4   Title Patient will score >1.48m/s on 85m walk test to demonstrate signifcant improvement in  gait speed and decrease in fall risk.    Baseline .36m/s   Time 6   Period Weeks   Status New               Plan - 05/12/16 1353    Clinical Impression Statement Focused on improving weight bearing on the affect LE to decrease swelling and improve heal rate on the affected LE. Patient demonstrates improvement in knee extension compared to previous visits indicating functional carryover and patient will benefit from further skilled therapy to return to prior level of function.    Rehab Potential Good   Clinical Impairments Affecting Rehab Potential (+) highly motivated, family support; (-) previous achilles tear, chronic knee pain   PT Frequency 2x / week   PT Duration 6 weeks   PT Treatment/Interventions Passive range of motion;Dry needling;Manual techniques;Balance training;Therapeutic exercise;Therapeutic  activities;Patient/family education;Stair training;Gait training;Neuromuscular re-education;Iontophoresis 4mg /ml Dexamethasone;Moist Heat;Ultrasound;Electrical Stimulation   PT Next Visit Plan Progress strengthening, decreasing extensor lag, and quad control   Consulted and Agree with Plan of Care Patient      Patient will benefit from skilled therapeutic intervention in order to improve the following deficits and impairments:  Abnormal gait, Increased fascial restricitons, Pain, Improper body mechanics, Decreased mobility, Increased muscle spasms,  Decreased range of motion, Decreased endurance, Decreased strength, Decreased activity tolerance, Decreased balance, Difficulty walking  Visit Diagnosis: Chronic pain of right knee  Stiffness of right knee, not elsewhere classified  Other abnormalities of gait and mobility       G-Codes - Jun 09, 2016 1356    Functional Assessment Tool Used (Outpatient Only) LEFS, VAS, 27mwt, MMT, clinical judgement   Functional Limitation Mobility: Walking and moving around   Mobility: Walking and Moving Around Current Status [phone]) At least 20 percent but less than 40 percent impaired, limited or restricted   Mobility: Walking and Moving Around Goal Status [phone]) At least 1 percent but less than 20 percent impaired, limited or restricted      Problem List Patient Active Problem List   Diagnosis Date Noted  . Positive ANA (antinuclear antibody) 02/22/2016  . Right knee pain 11/28/2015  . External hemorrhoid 07/13/2015  . Hearing loss of right ear due to cerumen impaction 05/28/2015  . Obesity, Class I, BMI 30-34.9 05/28/2015  . Benign prostatic hyperplasia   . Medicare annual wellness visit, initial 06-10-15  . Vitamin B12 deficiency 04/13/2013  . Fatigue 04/12/2013  . Left shoulder pain 09/10/2012  . DDD (degenerative disc disease), lumbar 12/06/2011  . Right cervical radiculopathy 08/31/2011  . Acquired deformity of nail 08/13/2011  .  Healthcare maintenance 06/04/2011  . Insomnia 03/04/2011  . Right shoulder pain 01/06/2011  . Chronic lower back pain   . Hypersomnolence 12/02/2010  . Hematuria - cause not known   . Depression with anxiety   . GERD (gastroesophageal reflux disease)   . OSA (obstructive sleep apnea)   . Torn rotator cuff   . Colon polyp     Blythe Stanford, PT DPT 2016-06-09, 1:57 PM  [place] MAIN [facility] SERVICES [address] [place], [place], [address] Phone: [phone]   Fax:  [phone]  Name: JAREK LONGTON MRN: [id number] Date of Birth: 01-23-1946

## 2016-05-13 ENCOUNTER — Ambulatory Visit: Payer: Medicare Other

## 2016-05-13 DIAGNOSIS — G8929 Other chronic pain: Secondary | ICD-10-CM | POA: Diagnosis not present

## 2016-05-13 DIAGNOSIS — M25661 Stiffness of right knee, not elsewhere classified: Secondary | ICD-10-CM | POA: Diagnosis not present

## 2016-05-13 DIAGNOSIS — M25561 Pain in right knee: Principal | ICD-10-CM

## 2016-05-13 DIAGNOSIS — R2689 Other abnormalities of gait and mobility: Secondary | ICD-10-CM | POA: Diagnosis not present

## 2016-05-13 NOTE — Therapy (Signed)
[place] MAIN [facility] SERVICES [address] [place], [place], [address] Phone: [phone]   Fax:  [phone]  Physical Therapy Treatment  Patient Details  Name: Gabriel [name] MRN: [id number] Date of Birth: 1945/10/28 No Data Recorded  Encounter Date: 05/13/2016      PT End of Session - 05/13/16 1117    Visit Number 6   Number of Visits 12   Date for PT Re-Evaluation 06/07/16   Authorization Type G Code (6/10)   PT Start Time 1004   PT Stop Time 0160   PT Time Calculation (min) 43 min   Activity Tolerance Patient tolerated treatment well;Patient limited by fatigue;Patient limited by pain   Behavior During Therapy [facility] for tasks assessed/performed      Past Medical History:  Diagnosis Date  . BPH (benign prostatic hypertrophy)    h/o prostate nodule, followed by Dr. Risa Grill yearly  . Cervical neck pain with evidence of disc disease    h/o bulging discs  . Chronic lower back pain    DDD, prior on lyrica, gabapentin, lumbar stenosis with persistent numbness R ant thigh, ESI didn't help, now seeing [facility]  . Depression with anxiety    h/o remote hospitalization for depression  . GERD (gastroesophageal reflux disease)   . Hematuria - cause not known    s/p urology workup 2010, nl CT, cystoscopy (doesn't want rpt), cytology  . History of chicken pox   . History of colon polyps 2005   tubular adenoma, on rpt no more (2010)  . Insomnia    Dohmeier  . OSA on CPAP    cpap machine at 10cm, ambien, daytime drowsiness attributed to OSA, last changed dextroamphetamine to nuvigil, previously tried provigil  . Prostate nodule    followed by Risa Grill, reassuring exam and PSA  . Seronegative arthritis    RA, previously on MTX, RF <7, ANA nl (rheum Dr. Donnamarie Poag)  . Torn rotator cuff    bilateral, s/p L repair and now recurrent, chronic massive R RTC tear (supraspinatus)    Past Surgical History:  Procedure Laterality Date  . ACHILLES TENDON  REPAIR  2002  . CATARACT EXTRACTION  2007   left  . COLONOSCOPY  11/2008   diverticulosis, rpt 5 yrs  . COLONOSCOPY  07/2014   2 TA, mild diverticulosis, rpt 5 yrs [name])  . DOBUTAMINE STRESS ECHO  08/2006   negative for ischemia  . LUMBAR DISC SURGERY  07/2010   L3/4 diskectomy ([facility]. Harl Bowie at [facility])  . MRI thoracic  09/2010   thoracic DDD/kyphosis, minimal anterolisthesis T2/3 with B foraminal narrowing  . ROTATOR CUFF REPAIR  2008   Left (Dr. Gaylyn Rong in [place], now [place])  . UPPER GASTROINTESTINAL ENDOSCOPY  02/2005   minimal GERD, small gastric polyp, tiny HH    There were no vitals filed for this visit.      Subjective Assessment - 05/13/16 1011    Subjective Patient reports no current knee pain but reports he almost fell when walking into the dentist this morning when dropping off his wife.   Pertinent History sleep apnea; previous achilles tendon repair on the L LE,    Limitations Standing;Walking   How long [name] you stand comfortably? 75min   How long [name] you walk comfortably? 11min   Patient Stated Goals walking longer and standing without pain.   Currently in Pain? No/denies   Pain Onset More than a month ago      Therapeutic exercises: Nustep -  seat level 11 with level 5 resistance - 2min Tandem stance amb - 4 x 22ft Ambulation with focus on heel strike - 6 x60ft Backwards ambulation with focus on improving Narrow BOS - x 2 x54ft Side stepping with GTB - 2 x 80ft Heel walking -- 2 x 41ft Wedding marches with GTB around knees - 2 x 61ft Treadmill walking - 1.40mph x 2.5 min with UE support TKE in stand against double black band - x20 Semi tandem stance on airex pad - x 24min on each side  Mini Squat with frequent TC and VC on hip positioning - x 20        PT Education - 05/13/16 1012    Education provided Yes   Education Details Form/technique with exercise   Person(s) Educated Patient   Methods Explanation;Demonstration   Comprehension Verbalized  understanding;Returned demonstration             PT Long Term Goals - 04/26/16 1307      PT LONG TERM GOAL #1   Title Pt will be independent with HEP to continue with benefits of therapy after discharge from physical therapy.    Baseline Dependent with exercise form/technique   Time 6   Period Weeks   Status New     PT LONG TERM GOAL #2   Title Pt will score 60/80 on the LEFS to demonstrate significant improvement in LE function and greater ability to walk without feeling fatigue.    Baseline 41/80   Time 6   Period Weeks   Status New     PT LONG TERM GOAL #3   Title Patient will score a 0/10 on the VAS as the worst pain in the past week to demonstrate improvement in knee function and pain.    Baseline 7/10    Time 6   Period Weeks   Status New     PT LONG TERM GOAL #4   Title Patient will score >1.35m/s on 7m walk test to demonstrate signifcant improvement in  gait speed and decrease in fall risk.    Baseline .84m/s   Time 6   Period Weeks   Status New               Plan - 05/13/16 1119    Clinical Impression Statement Focused on improving balance when ambulating and static balancing with narrow BOS. Patient demonstrates increased postural sway and required use on intermittent UE support to perform standing balance exercises indicating decreased propioception/balance. Patient will benefit from further skilled therapy to return to prior level of function.    Rehab Potential Good   Clinical Impairments Affecting Rehab Potential (+) highly motivated, family support; (-) previous achilles tear, chronic knee pain   PT Frequency 2x / week   PT Duration 6 weeks   PT Treatment/Interventions Passive range of motion;Dry needling;Manual techniques;Balance training;Therapeutic exercise;Therapeutic activities;Patient/family education;Stair training;Gait training;Neuromuscular re-education;Iontophoresis 4mg /ml Dexamethasone;Moist Heat;Ultrasound;Electrical Stimulation   PT  Next Visit Plan Progress strengthening, decreasing extensor lag, and quad control   Consulted and Agree with Plan of Care Patient      Patient will benefit from skilled therapeutic intervention in order to improve the following deficits and impairments:  Abnormal gait, Increased fascial restricitons, Pain, Improper body mechanics, Decreased mobility, Increased muscle spasms, Decreased range of motion, Decreased endurance, Decreased strength, Decreased activity tolerance, Decreased balance, Difficulty walking  Visit Diagnosis: Chronic pain of right knee  Stiffness of right knee, not elsewhere classified       G-Codes -  05/12/16 1356    Functional Assessment Tool Used (Outpatient Only) LEFS, VAS, 78mwt, MMT, clinical judgement   Functional Limitation Mobility: Walking and moving around   Mobility: Walking and Moving Around Current Status [phone]) At least 20 percent but less than 40 percent impaired, limited or restricted   Mobility: Walking and Moving Around Goal Status [phone]) At least 1 percent but less than 20 percent impaired, limited or restricted      Problem List Patient Active Problem List   Diagnosis Date Noted  . Positive ANA (antinuclear antibody) 02/22/2016  . Right knee pain 11/28/2015  . External hemorrhoid 07/13/2015  . Hearing loss of right ear due to cerumen impaction 05/28/2015  . Obesity, Class I, BMI 30-34.9 05/28/2015  . Benign prostatic hyperplasia   . Medicare annual wellness visit, initial 05/13/2015  . Vitamin B12 deficiency 04/13/2013  . Fatigue 04/12/2013  . Left shoulder pain 09/10/2012  . DDD (degenerative disc disease), lumbar 12/06/2011  . Right cervical radiculopathy 08/31/2011  . Acquired deformity of nail 08/13/2011  . Healthcare maintenance 06/04/2011  . Insomnia 03/04/2011  . Right shoulder pain 01/06/2011  . Chronic lower back pain   . Hypersomnolence 12/02/2010  . Hematuria - cause not known   . Depression with anxiety   . GERD  (gastroesophageal reflux disease)   . OSA (obstructive sleep apnea)   . Torn rotator cuff   . Colon polyp     Blythe Stanford, PT DPT 05/13/2016, 11:22 AM  [place] MAIN [facility] SERVICES [address] [place], [place], [address] Phone: [phone]   Fax:  [phone]  Name: Gabriel [name] MRN: [id number] Date of Birth: 07/18/1945

## 2016-05-17 ENCOUNTER — Ambulatory Visit: Payer: Medicare Other

## 2016-05-17 DIAGNOSIS — M25661 Stiffness of right knee, not elsewhere classified: Secondary | ICD-10-CM

## 2016-05-17 DIAGNOSIS — R2689 Other abnormalities of gait and mobility: Secondary | ICD-10-CM | POA: Diagnosis not present

## 2016-05-17 DIAGNOSIS — M25561 Pain in right knee: Principal | ICD-10-CM

## 2016-05-17 DIAGNOSIS — G8929 Other chronic pain: Secondary | ICD-10-CM | POA: Diagnosis not present

## 2016-05-17 NOTE — Therapy (Signed)
[place] MAIN [facility] SERVICES [address] [place], [place], [address] Phone: [phone]   Fax:  [phone]  Physical Therapy Treatment  Patient Details  Name: Gabriel [name] MRN: [id number] Date of Birth: [birth year] No Data Recorded  Encounter Date: 05/17/2016      PT End of Session - 05/17/16 1503    Visit Number 7   Number of Visits 12   Date for PT Re-Evaluation 06/07/16   Authorization Type G Code (7/10)   PT Start Time 7915   PT Stop Time 1500   PT Time Calculation (min) 45 min   Activity Tolerance Patient tolerated treatment well;Patient limited by fatigue;Patient limited by pain   Behavior During Therapy [facility] for tasks assessed/performed      Past Medical History:  Diagnosis Date  . BPH (benign prostatic hypertrophy)    h/o prostate nodule, followed by Dr. Risa Grill yearly  . Cervical neck pain with evidence of disc disease    h/o bulging discs  . Chronic lower back pain    DDD, prior on lyrica, gabapentin, lumbar stenosis with persistent numbness R ant thigh, ESI didn't help, now seeing [facility]  . Depression with anxiety    h/o remote hospitalization for depression  . GERD (gastroesophageal reflux disease)   . Hematuria - cause not known    s/p urology workup 2010, nl CT, cystoscopy (doesn't want rpt), cytology  . History of chicken pox   . History of colon polyps 2005   tubular adenoma, on rpt no more (2010)  . Insomnia    Dohmeier  . OSA on CPAP    cpap machine at 10cm, ambien, daytime drowsiness attributed to OSA, last changed dextroamphetamine to nuvigil, previously tried provigil  . Prostate nodule    followed by Risa Grill, reassuring exam and PSA  . Seronegative arthritis    RA, previously on MTX, RF <7, ANA nl (rheum Dr. Donnamarie Poag)  . Torn rotator cuff    bilateral, s/p L repair and now recurrent, chronic massive R RTC tear (supraspinatus)    Past Surgical History:  Procedure Laterality Date  . ACHILLES TENDON  REPAIR  2002  . CATARACT EXTRACTION  2007   left  . COLONOSCOPY  11/2008   diverticulosis, rpt 5 yrs  . COLONOSCOPY  07/2014   2 TA, mild diverticulosis, rpt 5 yrs [name])  . DOBUTAMINE STRESS ECHO  08/2006   negative for ischemia  . LUMBAR DISC SURGERY  07/2010   L3/4 diskectomy (Dr. Harl Bowie at [facility])  . MRI thoracic  09/2010   thoracic DDD/kyphosis, minimal anterolisthesis T2/3 with B foraminal narrowing  . ROTATOR CUFF REPAIR  2008   Left (Dr. Gaylyn Rong in [place], now [place])  . UPPER GASTROINTESTINAL ENDOSCOPY  02/2005   minimal GERD, small gastric polyp, tiny HH    There were no vitals filed for this visit.      Subjective Assessment - 05/17/16 1501    Subjective Patient reports increased hip soreness after prolonged walking the day before.    Pertinent History sleep apnea; previous achilles tendon repair on the L LE,    Limitations Standing;Walking   How long can you stand comfortably? 68min   How long can you walk comfortably? 33min   Patient Stated Goals walking longer and standing without pain.   Currently in Pain? No/denies   Pain Onset More than a month ago        Manual therapy: Patellar mobilizations with patient in long sitting Superior/inferior; laterally grade IV  for 3 x 30sec to allow for proper patellar mechanics when bending the knee.   Therapeutic exercises: Step ups on the stairs - 2x20  Long sitting SLR - 2 x 20 Sit to stands with left leg out in front - 2 x 20  Seated hamstring curls against GTB - x 25  Standing hip abduction - 2 x 10 with cueing on body positioning during performance Mini Squat with frequent TC and VC on hip positioning - x 20             PT Education - 05/17/16 1501    Education provided Yes   Education Details form/technique with exercises   Person(s) Educated Patient   Methods Explanation;Demonstration   Comprehension Verbalized understanding;Returned demonstration             PT Long Term Goals - 04/26/16 1307       PT LONG TERM GOAL #1   Title Pt will be independent with HEP to continue with benefits of therapy after discharge from physical therapy.    Baseline Dependent with exercise form/technique   Time 6   Period Weeks   Status New     PT LONG TERM GOAL #2   Title Pt will score 60/80 on the LEFS to demonstrate significant improvement in LE function and greater ability to walk without feeling fatigue.    Baseline 41/80   Time 6   Period Weeks   Status New     PT LONG TERM GOAL #3   Title Patient will score a 0/10 on the VAS as the worst pain in the past week to demonstrate improvement in knee function and pain.    Baseline 7/10    Time 6   Period Weeks   Status New     PT LONG TERM GOAL #4   Title Patient will score >1.28m/s on 48m walk test to demonstrate signifcant improvement in  gait speed and decrease in fall risk.    Baseline .41m/s   Time 6   Period Weeks   Status New               Plan - 05/17/16 1552    Clinical Impression Statement Focused on improving quadriceps strength and quadriceps activation/coordination to allow for performance of standing and functional activities. Patient demonstrates increased weaknesses and pain at end range of knee extension. Patient demonstrates improvement in stepping ability today less increased ease with movement. Patient will benefit from further skilled therapy to return to prior level of function.    Rehab Potential Good   Clinical Impairments Affecting Rehab Potential (+) highly motivated, family support; (-) previous achilles tear, chronic knee pain   PT Frequency 2x / week   PT Duration 6 weeks   PT Treatment/Interventions Passive range of motion;Dry needling;Manual techniques;Balance training;Therapeutic exercise;Therapeutic activities;Patient/family education;Stair training;Gait training;Neuromuscular re-education;Iontophoresis 4mg /ml Dexamethasone;Moist Heat;Ultrasound;Electrical Stimulation   PT Next Visit Plan Progress  strengthening, decreasing extensor lag, and quad control   Consulted and Agree with Plan of Care Patient      Patient will benefit from skilled therapeutic intervention in order to improve the following deficits and impairments:  Abnormal gait, Increased fascial restricitons, Pain, Improper body mechanics, Decreased mobility, Increased muscle spasms, Decreased range of motion, Decreased endurance, Decreased strength, Decreased activity tolerance, Decreased balance, Difficulty walking  Visit Diagnosis: Chronic pain of right knee  Stiffness of right knee, not elsewhere classified  Other abnormalities of gait and mobility     Problem List Patient Active Problem List  Diagnosis Date Noted  . Positive ANA (antinuclear antibody) 02/22/2016  . Right knee pain 11/28/2015  . External hemorrhoid 07/13/2015  . Hearing loss of right ear due to cerumen impaction 05/28/2015  . Obesity, Class I, BMI 30-34.9 05/28/2015  . Benign prostatic hyperplasia   . Medicare annual wellness visit, initial 05/13/2015  . Vitamin B12 deficiency 04/13/2013  . Fatigue 04/12/2013  . Left shoulder pain 09/10/2012  . DDD (degenerative disc disease), lumbar 12/06/2011  . Right cervical radiculopathy 08/31/2011  . Acquired deformity of nail 08/13/2011  . Healthcare maintenance 06/04/2011  . Insomnia 03/04/2011  . Right shoulder pain 01/06/2011  . Chronic lower back pain   . Hypersomnolence 12/02/2010  . Hematuria - cause not known   . Depression with anxiety   . GERD (gastroesophageal reflux disease)   . OSA (obstructive sleep apnea)   . Torn rotator cuff   . Colon polyp     Blythe Stanford, PT DPT 05/17/2016, 4:02 PM  [place] MAIN [facility] SERVICES [address] [place], [place], [address] Phone: [phone]   Fax:  [phone]  Name: SAGE KOPERA MRN: [id number] Date of Birth: [birth year]

## 2016-05-19 ENCOUNTER — Ambulatory Visit: Payer: Medicare Other

## 2016-05-19 DIAGNOSIS — R2689 Other abnormalities of gait and mobility: Secondary | ICD-10-CM | POA: Diagnosis not present

## 2016-05-19 DIAGNOSIS — M25561 Pain in right knee: Secondary | ICD-10-CM | POA: Diagnosis not present

## 2016-05-19 DIAGNOSIS — M25661 Stiffness of right knee, not elsewhere classified: Secondary | ICD-10-CM | POA: Diagnosis not present

## 2016-05-19 DIAGNOSIS — G8929 Other chronic pain: Secondary | ICD-10-CM | POA: Diagnosis not present

## 2016-05-19 NOTE — Therapy (Signed)
[place] MAIN [facility] SERVICES [address] [place], [place], [address] Phone: [phone]   Fax:  [phone]  Physical Therapy Treatment  Patient Details  Name: Gabriel [name] MRN: [id number] Date of Birth: 12-20-1945 No Data Recorded  Encounter Date: 05/19/2016      PT End of Session - 05/19/16 1735    Visit Number 8   Number of Visits 12   Date for PT Re-Evaluation 06/07/16   Authorization Type G Code (8/10)   PT Start Time 1645   PT Stop Time 1730   PT Time Calculation (min) 45 min   Activity Tolerance Patient tolerated treatment well;Patient limited by fatigue;Patient limited by pain   Behavior During Therapy [facility] for tasks assessed/performed      Past Medical History:  Diagnosis Date  . BPH (benign prostatic hypertrophy)    h/o prostate nodule, followed by Dr. Risa Grill yearly  . Cervical neck pain with evidence of disc disease    h/o bulging discs  . Chronic lower back pain    DDD, prior on lyrica, gabapentin, lumbar stenosis with persistent numbness R ant thigh, ESI didn't help, now seeing [facility]  . Depression with anxiety    h/o remote hospitalization for depression  . GERD (gastroesophageal reflux disease)   . Hematuria - cause not known    s/p urology workup 2010, nl CT, cystoscopy (doesn't want rpt), cytology  . History of chicken pox   . History of colon polyps 2005   tubular adenoma, on rpt no more (2010)  . Insomnia    Dohmeier  . OSA on CPAP    cpap machine at 10cm, ambien, daytime drowsiness attributed to OSA, last changed dextroamphetamine to nuvigil, previously tried provigil  . Prostate nodule    followed by Risa Grill, reassuring exam and PSA  . Seronegative arthritis    RA, previously on MTX, RF <7, ANA nl (rheum Dr. Donnamarie Poag)  . Torn rotator cuff    bilateral, s/p L repair and now recurrent, chronic massive R RTC tear (supraspinatus)    Past Surgical History:  Procedure Laterality Date  . ACHILLES TENDON  REPAIR  2002  . CATARACT EXTRACTION  2007   left  . COLONOSCOPY  11/2008   diverticulosis, rpt 5 yrs  . COLONOSCOPY  07/2014   2 TA, mild diverticulosis, rpt 5 yrs [name])  . DOBUTAMINE STRESS ECHO  08/2006   negative for ischemia  . LUMBAR DISC SURGERY  07/2010   L3/4 diskectomy (Dr. Harl Bowie at [facility])  . MRI thoracic  09/2010   thoracic DDD/kyphosis, minimal anterolisthesis T2/3 with B foraminal narrowing  . ROTATOR CUFF REPAIR  2008   Left (Dr. Gaylyn Rong in [place], now [place])  . UPPER GASTROINTESTINAL ENDOSCOPY  02/2005   minimal GERD, small gastric polyp, tiny HH    There were no vitals filed for this visit.      Subjective Assessment - 05/19/16 1734    Subjective Patient reports he wishes he didnt have to 'limp' when walking and would like to work on walking today for therapy.    Pertinent History sleep apnea; previous achilles tendon repair on the L LE,    Limitations Standing;Walking   How long can you stand comfortably? 83min   How long can you walk comfortably? 66min   Patient Stated Goals walking longer and standing without pain.   Currently in Pain? No/denies   Pain Onset More than a month ago      Therapeutic exercises: Standing Hamstring curls -  2 x 20 with intermittent UE support Stepping forward and back with L foot and R in stance phase forward and backward over two half foam rollers - x 40 without UE support Mini Squat with frequent TC and VC on hip positioning - 2 x 20   Leg Press on the R leg only - 75# 2 x 20 Toe taps foot behind from 6" step - x 25 with cueing on knee/hip positioning Ambulation with focus on performing heel strike B - x 293ft        PT Education - 05/19/16 1735    Education provided Yes   Education Details form/technique with exercises   Person(s) Educated Patient   Methods Explanation;Demonstration   Comprehension Verbalized understanding;Returned demonstration             PT Long Term Goals - 04/26/16 1307      PT LONG  TERM GOAL #1   Title Pt will be independent with HEP to continue with benefits of therapy after discharge from physical therapy.    Baseline Dependent with exercise form/technique   Time 6   Period Weeks   Status New     PT LONG TERM GOAL #2   Title Pt will score 60/80 on the LEFS to demonstrate significant improvement in LE function and greater ability to walk without feeling fatigue.    Baseline 41/80   Time 6   Period Weeks   Status New     PT LONG TERM GOAL #3   Title Patient will score a 0/10 on the VAS as the worst pain in the past week to demonstrate improvement in knee function and pain.    Baseline 7/10    Time 6   Period Weeks   Status New     PT LONG TERM GOAL #4   Title Patient will score >1.19m/s on 27m walk test to demonstrate signifcant improvement in  gait speed and decrease in fall risk.    Baseline .20m/s   Time 6   Period Weeks   Status New               Plan - 05/19/16 1736    Clinical Impression Statement Focused on improving Stance time on R LE wihtout pain while maintaining full knee extension throughout the motion. Patient demonstrates improvement at end of therapy with ambulation after cueing to walk at a slower speed. Patient will benefit from further skilled therapy focused on improving SLS stance and support to return to prior level of function.    Rehab Potential Good   Clinical Impairments Affecting Rehab Potential (+) highly motivated, family support; (-) previous achilles tear, chronic knee pain   PT Frequency 2x / week   PT Duration 6 weeks   PT Treatment/Interventions Passive range of motion;Dry needling;Manual techniques;Balance training;Therapeutic exercise;Therapeutic activities;Patient/family education;Stair training;Gait training;Neuromuscular re-education;Iontophoresis 4mg /ml Dexamethasone;Moist Heat;Ultrasound;Electrical Stimulation   PT Next Visit Plan Progress strengthening, decreasing extensor lag, and quad control   Consulted and  Agree with Plan of Care Patient      Patient will benefit from skilled therapeutic intervention in order to improve the following deficits and impairments:  Abnormal gait, Increased fascial restricitons, Pain, Improper body mechanics, Decreased mobility, Increased muscle spasms, Decreased range of motion, Decreased endurance, Decreased strength, Decreased activity tolerance, Decreased balance, Difficulty walking  Visit Diagnosis: Chronic pain of right knee  Stiffness of right knee, not elsewhere classified  Other abnormalities of gait and mobility     Problem List Patient Active Problem List  Diagnosis Date Noted  . Positive ANA (antinuclear antibody) 02/22/2016  . Right knee pain 11/28/2015  . External hemorrhoid 07/13/2015  . Hearing loss of right ear due to cerumen impaction 05/28/2015  . Obesity, Class I, BMI 30-34.9 05/28/2015  . Benign prostatic hyperplasia   . Medicare annual wellness visit, initial 05/13/2015  . Vitamin B12 deficiency 04/13/2013  . Fatigue 04/12/2013  . Left shoulder pain 09/10/2012  . DDD (degenerative disc disease), lumbar 12/06/2011  . Right cervical radiculopathy 08/31/2011  . Acquired deformity of nail 08/13/2011  . Healthcare maintenance 06/04/2011  . Insomnia 03/04/2011  . Right shoulder pain 01/06/2011  . Chronic lower back pain   . Hypersomnolence 12/02/2010  . Hematuria - cause not known   . Depression with anxiety   . GERD (gastroesophageal reflux disease)   . OSA (obstructive sleep apnea)   . Torn rotator cuff   . Colon polyp     Blythe Stanford, PT DPT 05/19/2016, 5:38 PM  [place] MAIN [facility] SERVICES [address] [place], [place], [address] Phone: [phone]   Fax:  [phone]  Name: Gabriel [name] MRN: [id number] Date of Birth: 1945/10/03

## 2016-05-25 ENCOUNTER — Ambulatory Visit: Payer: Medicare Other

## 2016-05-25 DIAGNOSIS — R2689 Other abnormalities of gait and mobility: Secondary | ICD-10-CM | POA: Diagnosis not present

## 2016-05-25 DIAGNOSIS — M25661 Stiffness of right knee, not elsewhere classified: Secondary | ICD-10-CM | POA: Diagnosis not present

## 2016-05-25 DIAGNOSIS — G8929 Other chronic pain: Secondary | ICD-10-CM

## 2016-05-25 DIAGNOSIS — M25561 Pain in right knee: Principal | ICD-10-CM

## 2016-05-25 NOTE — Therapy (Signed)
[place] MAIN [facility] SERVICES [address] [place], [place], [address] Phone: [phone]   Fax:  [phone]  Physical Therapy Treatment  Patient Details  Name: Gabriel [name] MRN: [id number] Date of Birth: 06-21-1945 No Data Recorded  Encounter Date: 05/25/2016      PT End of Session - 05/25/16 0841    Visit Number 9   Number of Visits 12   Date for PT Re-Evaluation 06/07/16   Authorization Type G Code (9/10)   PT Start Time 0815   PT Stop Time 0900   PT Time Calculation (min) 45 min   Activity Tolerance Patient tolerated treatment well;Patient limited by fatigue;Patient limited by pain   Behavior During Therapy [facility] for tasks assessed/performed      Past Medical History:  Diagnosis Date  . BPH (benign prostatic hypertrophy)    h/o prostate nodule, followed by Dr. Risa Grill yearly  . Cervical neck pain with evidence of disc disease    h/o bulging discs  . Chronic lower back pain    DDD, prior on lyrica, gabapentin, lumbar stenosis with persistent numbness R ant thigh, ESI didn't help, now seeing [facility]  . Depression with anxiety    h/o remote hospitalization for depression  . GERD (gastroesophageal reflux disease)   . Hematuria - cause not known    s/p urology workup 2010, nl CT, cystoscopy (doesn't want rpt), cytology  . History of chicken pox   . History of colon polyps 2005   tubular adenoma, on rpt no more (2010)  . Insomnia    Dohmeier  . OSA on CPAP    cpap machine at 10cm, ambien, daytime drowsiness attributed to OSA, last changed dextroamphetamine to nuvigil, previously tried provigil  . Prostate nodule    followed by Risa Grill, reassuring exam and PSA  . Seronegative arthritis    RA, previously on MTX, RF <7, ANA nl (rheum Dr. Donnamarie Poag)  . Torn rotator cuff    bilateral, s/p L repair and now recurrent, chronic massive R RTC tear (supraspinatus)    Past Surgical History:  Procedure Laterality Date  . ACHILLES TENDON  REPAIR  2002  . CATARACT EXTRACTION  2007   left  . COLONOSCOPY  11/2008   diverticulosis, rpt 5 yrs  . COLONOSCOPY  07/2014   2 TA, mild diverticulosis, rpt 5 yrs [name])  . DOBUTAMINE STRESS ECHO  08/2006   negative for ischemia  . LUMBAR DISC SURGERY  07/2010   L3/4 diskectomy ([facility]. Harl Bowie at [facility])  . MRI thoracic  09/2010   thoracic DDD/kyphosis, minimal anterolisthesis T2/3 with B foraminal narrowing  . ROTATOR CUFF REPAIR  2008   Left (Dr. Gaylyn Rong in [place], now [place])  . UPPER GASTROINTESTINAL ENDOSCOPY  02/2005   minimal GERD, small gastric polyp, tiny HH    There were no vitals filed for this visit.      Subjective Assessment - 05/25/16 0835    Subjective Patient reports he's been having less difficulty with walking stating he's been walking with less of a 'limp'.   Pertinent History sleep apnea; previous achilles tendon repair on the L LE,    Limitations Standing;Walking   How long can you stand comfortably? 77min   How long can you walk comfortably? 22min   Patient Stated Goals walking longer and standing without pain.   Currently in Pain? No/denies   Pain Onset More than a month ago      TREATMENT: Therapeutic exercises: Leg Press on the R leg  only - 60#, 75# 2 x 20 SLR in long sitting - 3 x 20 in long sitting Lunges in standing - x15 B  Mini Squat with frequent TC and VC on hip positioning - 2 x 20   Standing Hamstring curls - 2 x 20 with intermittent UE support Step ups onto 8" step - 2 x 20  Ambulation with focus on performing heel strike B - x 297ft  Manual Therapy: Patellar mobilizations with patient in long sitting Superior/inferior; laterally grade IV for 3 x 30sec to allow for proper patellar mechanics when bending the knee.        PT Education - 05/25/16 0840    Education provided Yes   Education Details form/technique with exercises   Person(s) Educated Patient   Methods Explanation;Demonstration   Comprehension Verbalized  understanding;Returned demonstration             PT Long Term Goals - 04/26/16 1307      PT LONG TERM GOAL #1   Title Pt will be independent with HEP to continue with benefits of therapy after discharge from physical therapy.    Baseline Dependent with exercise form/technique   Time 6   Period Weeks   Status New     PT LONG TERM GOAL #2   Title Pt will score 60/80 on the LEFS to demonstrate significant improvement in LE function and greater ability to walk without feeling fatigue.    Baseline 41/80   Time 6   Period Weeks   Status New     PT LONG TERM GOAL #3   Title Patient will score a 0/10 on the VAS as the worst pain in the past week to demonstrate improvement in knee function and pain.    Baseline 7/10    Time 6   Period Weeks   Status New     PT LONG TERM GOAL #4   Title Patient will score >1.20m/s on 33m walk test to demonstrate signifcant improvement in  gait speed and decrease in fall risk.    Baseline .34m/s   Time 6   Period Weeks   Status New               Plan - 05/25/16 6761    Clinical Impression Statement Conitnued to focus on improving quadriceps strengthening and improving knee flexion AROM. Patient demonstratres decreased strength but shows improvenment in ambulation ability requiring less cueing to perform. Patient will benefit from further skilled therapy to improve limitations and return to prior level of function.    Rehab Potential Good   Clinical Impairments Affecting Rehab Potential (+) highly motivated, family support; (-) previous achilles tear, chronic knee pain   PT Frequency 2x / week   PT Duration 6 weeks   PT Treatment/Interventions Passive range of motion;Dry needling;Manual techniques;Balance training;Therapeutic exercise;Therapeutic activities;Patient/family education;Stair training;Gait training;Neuromuscular re-education;Iontophoresis 4mg /ml Dexamethasone;Moist Heat;Ultrasound;Electrical Stimulation   PT Next Visit Plan  Progress strengthening, decreasing extensor lag, and quad control   Consulted and Agree with Plan of Care Patient      Patient will benefit from skilled therapeutic intervention in order to improve the following deficits and impairments:  Abnormal gait, Increased fascial restricitons, Pain, Improper body mechanics, Decreased mobility, Increased muscle spasms, Decreased range of motion, Decreased endurance, Decreased strength, Decreased activity tolerance, Decreased balance, Difficulty walking  Visit Diagnosis: No diagnosis found.     Problem List Patient Active Problem List   Diagnosis Date Noted  . Positive ANA (antinuclear antibody) 02/22/2016  . Right  knee pain 11/28/2015  . External hemorrhoid 07/13/2015  . Hearing loss of right ear due to cerumen impaction 05/28/2015  . Obesity, Class I, BMI 30-34.9 05/28/2015  . Benign prostatic hyperplasia   . Medicare annual wellness visit, initial 05/13/2015  . Vitamin B12 deficiency 04/13/2013  . Fatigue 04/12/2013  . Left shoulder pain 09/10/2012  . DDD (degenerative disc disease), lumbar 12/06/2011  . Right cervical radiculopathy 08/31/2011  . Acquired deformity of nail 08/13/2011  . Healthcare maintenance 06/04/2011  . Insomnia 03/04/2011  . Right shoulder pain 01/06/2011  . Chronic lower back pain   . Hypersomnolence 12/02/2010  . Hematuria - cause not known   . Depression with anxiety   . GERD (gastroesophageal reflux disease)   . OSA (obstructive sleep apnea)   . Torn rotator cuff   . Colon polyp     Blythe Stanford, PT DPT 05/25/2016, 9:08 AM  [place] MAIN [facility] SERVICES [address] [place], [place], [address] Phone: [phone]   Fax:  [phone]  Name: SEIJI WISWELL MRN: [id number] Date of Birth: [birth year]

## 2016-05-26 ENCOUNTER — Other Ambulatory Visit: Payer: Self-pay | Admitting: Family Medicine

## 2016-05-26 ENCOUNTER — Ambulatory Visit (INDEPENDENT_AMBULATORY_CARE_PROVIDER_SITE_OTHER): Payer: Medicare Other

## 2016-05-26 ENCOUNTER — Ambulatory Visit: Payer: Medicare Other

## 2016-05-26 ENCOUNTER — Other Ambulatory Visit: Payer: Medicare Other

## 2016-05-26 VITALS — BP 130/72 | HR 64 | Temp 98.6°F | Ht 67.0 in | Wt 215.8 lb

## 2016-05-26 DIAGNOSIS — Z23 Encounter for immunization: Secondary | ICD-10-CM

## 2016-05-26 DIAGNOSIS — G8929 Other chronic pain: Secondary | ICD-10-CM | POA: Diagnosis not present

## 2016-05-26 DIAGNOSIS — E538 Deficiency of other specified B group vitamins: Secondary | ICD-10-CM

## 2016-05-26 DIAGNOSIS — M25561 Pain in right knee: Principal | ICD-10-CM

## 2016-05-26 DIAGNOSIS — E66811 Obesity, class 1: Secondary | ICD-10-CM

## 2016-05-26 DIAGNOSIS — M25661 Stiffness of right knee, not elsewhere classified: Secondary | ICD-10-CM

## 2016-05-26 DIAGNOSIS — E669 Obesity, unspecified: Secondary | ICD-10-CM

## 2016-05-26 DIAGNOSIS — Z0000 Encounter for general adult medical examination without abnormal findings: Secondary | ICD-10-CM

## 2016-05-26 DIAGNOSIS — R2689 Other abnormalities of gait and mobility: Secondary | ICD-10-CM

## 2016-05-26 NOTE — Therapy (Signed)
[place] MAIN [facility] SERVICES [address] [place], [place], [address] Phone: [phone]   Fax:  [phone]  Physical Therapy Treatment  Patient Details  Name: Gabriel [name] MRN: [id number] Date of Birth: [birth year] No Data Recorded  Encounter Date: 05/26/2016      PT End of Session - 05/26/16 1459    Visit Number 10   Number of Visits 12   Date for PT Re-Evaluation 06/07/16   Authorization Type G Code (10/10)   PT Start Time 1300   PT Stop Time 1345   PT Time Calculation (min) 45 min   Activity Tolerance Patient tolerated treatment well;Patient limited by fatigue;Patient limited by pain   Behavior During Therapy [facility] for tasks assessed/performed      Past Medical History:  Diagnosis Date  . BPH (benign prostatic hypertrophy)    h/o prostate nodule, followed by Dr. Risa Grill yearly  . Cervical neck pain with evidence of disc disease    h/o bulging discs  . Chronic lower back pain    DDD, prior on lyrica, gabapentin, lumbar stenosis with persistent numbness R ant thigh, ESI didn't help, now seeing [facility]  . Depression with anxiety    h/o remote hospitalization for depression  . GERD (gastroesophageal reflux disease)   . Hematuria - cause not known    s/p urology workup 2010, nl CT, cystoscopy (doesn't want rpt), cytology  . History of chicken pox   . History of colon polyps 2005   tubular adenoma, on rpt no more (2010)  . Insomnia    Dohmeier  . OSA on CPAP    cpap machine at 10cm, ambien, daytime drowsiness attributed to OSA, last changed dextroamphetamine to nuvigil, previously tried provigil  . Prostate nodule    followed by Risa Grill, reassuring exam and PSA  . Seronegative arthritis    RA, previously on MTX, RF <7, ANA nl (rheum Dr. Donnamarie Poag)  . Torn rotator cuff    bilateral, s/p L repair and now recurrent, chronic massive R RTC tear (supraspinatus)    Past Surgical History:  Procedure Laterality Date  . ACHILLES TENDON  REPAIR  2002  . CATARACT EXTRACTION  2007   left  . COLONOSCOPY  11/2008   diverticulosis, rpt 5 yrs  . COLONOSCOPY  07/2014   2 TA, mild diverticulosis, rpt 5 yrs [name])  . DOBUTAMINE STRESS ECHO  08/2006   negative for ischemia  . LUMBAR DISC SURGERY  07/2010   L3/4 diskectomy ([facility] Harl Bowie at [facility])  . MRI thoracic  09/2010   thoracic DDD/kyphosis, minimal anterolisthesis T2/3 with B foraminal narrowing  . ROTATOR CUFF REPAIR  2008   Left (Dr. Gaylyn Rong in [place], now [place])  . UPPER GASTROINTESTINAL ENDOSCOPY  02/2005   minimal GERD, small gastric polyp, tiny HH    There were no vitals filed for this visit.      Subjective Assessment - 05/26/16 1329    Subjective Patient reports he's been walking better and the worst pain he's hd in the past week has been a 0/10.    Pertinent History sleep apnea; previous achilles tendon repair on the L LE,    Limitations Standing;Walking   How long can you stand comfortably? 59min   How long can you walk comfortably? 35min   Patient Stated Goals walking longer and standing without pain.   Currently in Pain? No/denies   Pain Onset More than a month ago      TREATMENT: Therapeutic exercises: Step ups onto  8" step - 2 x 20  SLR in long sitting - 3 x 20 in long sitting Leg Press on the R leg only -75# + 5# ankle weight on weight stack -- 2 x 20 Mini Squat with frequent TC and VC on hip positioning -  x 20   Monster walks with GTB - 4 x 7ft Standing Hamstring curls - 2 x 20 with intermittent UE support Ambulation with focus on performing heel strike B - x 224ft   Manual Therapy: Patellar mobilizations with patient in long sitting Superior/inferior; laterally grade IV for 3 x 30sec to allow for proper patellar mechanics when bending the knee.       PT Education - 05/26/16 1458    Education provided Yes   Education Details POC, form/technique with exercise   Person(s) Educated Patient   Methods Explanation;Demonstration    Comprehension Verbalized understanding;Returned demonstration             PT Long Term Goals - 05/26/16 1507      PT LONG TERM GOAL #1   Title Pt will be independent with HEP to continue with benefits of therapy after discharge from physical therapy.    Baseline Dependent with exercise form/technique; 05/26/2016: Moderate cueing for exercise performance   Time 6   Period Weeks   Status On-going     PT LONG TERM GOAL #2   Title Pt will score 60/80 on the LEFS to demonstrate significant improvement in LE function and greater ability to walk without feeling fatigue.    Baseline 41/80; 05/26/2016: LEFS: 53/80   Time 6   Period Weeks   Status On-going     PT LONG TERM GOAL #3   Title Patient will score a 0/10 on the VAS as the worst pain in the past week to demonstrate improvement in knee function and pain.    Baseline 7/10 05/26/2016: 3/10   Time 6   Period Weeks   Status On-going     PT LONG TERM GOAL #4   Title Patient will score >1.50m/s on 70m walk test to demonstrate signifcant improvement in  gait speed and decrease in fall risk.    Baseline .64m/s 05/26/2016: .77m/s   Time 6   Period Weeks   Status On-going               Plan - 05/26/16 1500    Clinical Impression Statement Focused on improving walking speed without antalgic gait today; patient able to increase speed without antalgic gait. Patient demonstrates significant improvement in goals with advancement in LEFS and NPRS indicating functional improvement. Although patient is improving, he continues to demonstrate increased pain with walking and decreased knee function with in SLS stance and patient will benefit from further skilled therapy to return to prior level of function.   Rehab Potential Good   Clinical Impairments Affecting Rehab Potential (+) highly motivated, family support; (-) previous achilles tear, chronic knee pain   PT Frequency 2x / week   PT Duration 6 weeks   PT Treatment/Interventions  Passive range of motion;Dry needling;Manual techniques;Balance training;Therapeutic exercise;Therapeutic activities;Patient/family education;Stair training;Gait training;Neuromuscular re-education;Iontophoresis 4mg /ml Dexamethasone;Moist Heat;Ultrasound;Electrical Stimulation   PT Next Visit Plan Progress strengthening, decreasing extensor lag, and quad control   Consulted and Agree with Plan of Care Patient      Patient will benefit from skilled therapeutic intervention in order to improve the following deficits and impairments:  Abnormal gait, Increased fascial restricitons, Pain, Improper body mechanics, Decreased mobility, Increased muscle spasms, Decreased  range of motion, Decreased endurance, Decreased strength, Decreased activity tolerance, Decreased balance, Difficulty walking  Visit Diagnosis: Chronic pain of right knee - Plan: PT plan of care cert/re-cert  Stiffness of right knee, not elsewhere classified - Plan: PT plan of care cert/re-cert  Other abnormalities of gait and mobility - Plan: PT plan of care cert/re-cert       G-Codes - 05-28-16 May 09, 1507    Functional Assessment Tool Used (Outpatient Only) LEFS, VAS, 65mwt, MMT, clinical judgement   Functional Limitation Mobility: Walking and moving around   Mobility: Walking and Moving Around Current Status [phone]) At least 20 percent but less than 40 percent impaired, limited or restricted   Mobility: Walking and Moving Around Goal Status [phone]) At least 1 percent but less than 20 percent impaired, limited or restricted      Problem List Patient Active Problem List   Diagnosis Date Noted  . Positive ANA (antinuclear antibody) 02/22/2016  . Right knee pain 11/28/2015  . External hemorrhoid 07/13/2015  . Hearing loss of right ear due to cerumen impaction 05/28/2015  . Obesity, Class I, BMI 30-34.9 05/28/2015  . Benign prostatic hyperplasia   . Medicare annual wellness visit, initial 05/13/2015  . Vitamin B12 deficiency  04/13/2013  . Fatigue 04/12/2013  . Left shoulder pain 09/10/2012  . DDD (degenerative disc disease), lumbar 12/06/2011  . Right cervical radiculopathy 08/31/2011  . Acquired deformity of nail 08/13/2011  . Healthcare maintenance 06/04/2011  . Insomnia 03/04/2011  . Right shoulder pain 01/06/2011  . Chronic lower back pain   . Hypersomnolence 12/02/2010  . Hematuria - cause not known   . Depression with anxiety   . GERD (gastroesophageal reflux disease)   . OSA (obstructive sleep apnea)   . Torn rotator cuff   . Colon polyp     Blythe Stanford, PT DPT 28-May-2016, 3:33 PM  [place] MAIN [facility] SERVICES [address] [place], [place], [address] Phone: [phone]   Fax:  [phone]  Name: Gabriel [name] MRN: [id number] Date of Birth: 26-Jun-1945

## 2016-05-26 NOTE — Progress Notes (Signed)
Pre visit review using our clinic review tool, if applicable. No additional management support is needed unless otherwise documented below in the visit note. 

## 2016-05-26 NOTE — Patient Instructions (Signed)
Gabriel [name] , Thank you for taking time to come for your Medicare Wellness Visit. I appreciate your ongoing commitment to your health goals. Please review the following plan we discussed and let me know if I can assist you in the future.   These are the goals we discussed: Goals    . Eat more fruits and vegetables          Starting 05/13/2015, I will begin eating at least 4-5 servings of fresh fruits and vegetables.     . Increase physical activity          Starting 05/26/2016, I will continue to exercise at least 60 min 2 days per week and participate in physical therapy as advised.        This is a list of the screening recommended for you and due dates:  Health Maintenance  Topic Date Due  . Flu Shot  09/07/2016  . Colon Cancer Screening  06/30/2017  . DTaP/Tdap/Td vaccine (2 - Td) 05/01/2019  . Tetanus Vaccine  05/01/2019  .  Hepatitis C: One time screening is recommended by Center for Disease Control  (CDC) for  adults born from [birth year] through 1963.   Completed  . Pneumonia vaccines  Completed   Preventive Care for Adults  A healthy lifestyle and preventive care can promote health and wellness. Preventive health guidelines for adults include the following key practices.  . A routine yearly physical is a good way to check with your health care provider about your health and preventive screening. It is a chance to share any concerns and updates on your health and to receive a thorough exam.  . Visit your dentist for a routine exam and preventive care every 6 months. Brush your teeth twice a day and floss once a day. Good oral hygiene prevents tooth decay and gum disease.  . The frequency of eye exams is based on your age, health, family medical history, use  of contact lenses, and other factors. Follow your health care provider's ecommendations for frequency of eye exams.  . Eat a healthy diet. Foods like vegetables, fruits, whole grains, low-fat dairy products, and lean protein  foods contain the nutrients you need without too many calories. Decrease your intake of foods high in solid fats, added sugars, and salt. Eat the right amount of calories for you. Get information about a proper diet from your health care provider, if necessary.  . Regular physical exercise is one of the most important things you can do for your health. Most adults should get at least 150 minutes of moderate-intensity exercise (any activity that increases your heart rate and causes you to sweat) each week. In addition, most adults need muscle-strengthening exercises on 2 or more days a week.  Silver Sneakers may be a benefit available to you. To determine eligibility, you may visit the website: www.silversneakers.com or contact program at 904-596-3202 Mon-Fri between 8AM-8PM.   . Maintain a healthy weight. The body mass index (BMI) is a screening tool to identify possible weight problems. It provides an estimate of body fat based on height and weight. Your health care provider can find your BMI and can help you achieve or maintain a healthy weight.   For adults 20 years and older: ? A BMI below 18.5 is considered underweight. ? A BMI of 18.5 to 24.9 is normal. ? A BMI of 25 to 29.9 is considered overweight. ? A BMI of 30 and above is considered obese.   . Maintain normal  blood lipids and cholesterol levels by exercising and minimizing your intake of saturated fat. Eat a balanced diet with plenty of fruit and vegetables. Blood tests for lipids and cholesterol should begin at age 11 and be repeated every 5 years. If your lipid or cholesterol levels are high, you are over 50, or you are at high risk for heart disease, you may need your cholesterol levels checked more frequently. Ongoing high lipid and cholesterol levels should be treated with medicines if diet and exercise are not working.  . If you smoke, find out from your health care provider how to quit. If you do not use tobacco, please do not  start.  . If you choose to drink alcohol, please do not consume more than 2 drinks per day. One drink is considered to be 12 ounces (355 mL) of beer, 5 ounces (148 mL) of wine, or 1.5 ounces (44 mL) of liquor.  . If you are 7-23 years old, ask your health care provider if you should take aspirin to prevent strokes.  . Use sunscreen. Apply sunscreen liberally and repeatedly throughout the day. You should seek shade when your shadow is shorter than you. Protect yourself by wearing long sleeves, pants, a wide-brimmed hat, and sunglasses year round, whenever you are outdoors.  . Once a month, do a whole body skin exam, using a mirror to look at the skin on your back. Tell your health care provider of new moles, moles that have irregular borders, moles that are larger than a pencil eraser, or moles that have changed in shape or color.

## 2016-05-27 ENCOUNTER — Ambulatory Visit: Payer: Medicare Other

## 2016-05-27 NOTE — Progress Notes (Signed)
PCP notes:   Health maintenance:  PPSV23 - administered  Abnormal screenings:   Hearing - failed Depression screen: 11  Patient concerns:   Pt reported a raised lump to upper back. PCP assessed this raised lump and advised pt he had a cyst. Pt denied any pain. There was not any redness present to lump or surrounding skin.  Pt also reported tightness and pressure to right wrist.  Nurse concerns:  None  Next PCP appt:   05/30/16 @ 0830

## 2016-05-27 NOTE — Progress Notes (Signed)
Subjective:   Gabriel [name] is a 71 y.o. male who presents for Medicare Annual/Subsequent preventive examination.  Review of Systems:  N/A Cardiac Risk Factors include: advanced age (>79men, >29 women);male gender;obesity (BMI >30kg/m2)     Objective:    Vitals: BP 130/72 (BP Location: Right Arm, Patient Position: Sitting, Cuff Size: Normal)   Pulse 64   Temp 98.6 F (37 C) (Oral)   Ht 5\' 7"  (1.702 m) Comment: no shoes  Wt 215 lb 12 oz (97.9 kg)   SpO2 96%   BMI 33.79 kg/m   Body mass index is 33.79 kg/m.  Tobacco History  Smoking Status  . Never Smoker  Smokeless Tobacco  . Never Used     Counseling given: No   Past Medical History:  Diagnosis Date  . BPH (benign prostatic hypertrophy)    h/o prostate nodule, followed by Dr. Risa Grill yearly  . Cervical neck pain with evidence of disc disease    h/o bulging discs  . Chronic lower back pain    DDD, prior on lyrica, gabapentin, lumbar stenosis with persistent numbness R ant thigh, ESI didn't help, now seeing [facility]  . Depression with anxiety    h/o remote hospitalization for depression  . GERD (gastroesophageal reflux disease)   . Hematuria - cause not known    s/p urology workup 2010, nl CT, cystoscopy (doesn't want rpt), cytology  . History of chicken pox   . History of colon polyps 2005   tubular adenoma, on rpt no more (2010)  . Insomnia    Dohmeier  . OSA on CPAP    cpap machine at 10cm, ambien, daytime drowsiness attributed to OSA, last changed dextroamphetamine to nuvigil, previously tried provigil  . Prostate nodule    followed by Risa Grill, reassuring exam and PSA  . Seronegative arthritis    RA, previously on MTX, RF <7, ANA nl (rheum Dr. Donnamarie Poag)  . Torn rotator cuff    bilateral, s/p L repair and now recurrent, chronic massive R RTC tear (supraspinatus)   Past Surgical History:  Procedure Laterality Date  . ACHILLES TENDON REPAIR  2002  . CATARACT EXTRACTION  2007   left  . COLONOSCOPY  11/2008     diverticulosis, rpt 5 yrs  . COLONOSCOPY  07/2014   2 TA, mild diverticulosis, rpt 5 yrs [name])  . DOBUTAMINE STRESS ECHO  08/2006   negative for ischemia  . LUMBAR DISC SURGERY  07/2010   L3/4 diskectomy (Dr. Harl [facility]ie at [facility])  . MRI thoracic  09/2010   thoracic DDD/kyphosis, minimal anterolisthesis T2/3 with B foraminal narrowing  . ROTATOR CUFF REPAIR  2008   Left (Dr. Gaylyn Rong in [place], now [place])  . UPPER GASTROINTESTINAL ENDOSCOPY  02/2005   minimal GERD, small gastric polyp, tiny HH   Family History  Problem Relation Age of Onset  . Arthritis Mother   . COPD Father     smoker  . Cancer Neg Hx   . Stroke Neg Hx   . Coronary artery disease Neg Hx   . Diabetes Neg Hx    History  Sexual Activity  . Sexual activity: Yes    Outpatient Encounter Prescriptions as of 05/26/2016  Medication Sig  . acetaminophen (TYLENOL) 500 MG tablet Take 500 mg by mouth every 6 (six) hours as needed.  Marland Kitchen buPROPion (WELLBUTRIN XL) 300 MG 24 hr tablet TAKE 1 TABLET DAILY  . HYDROcodone-acetaminophen (NORCO/VICODIN) 5-325 MG tablet Take 1 tablet by mouth as needed for moderate pain.  Marland Kitchen  loratadine (CLARITIN) 10 MG tablet Take 10 mg by mouth daily as needed for allergies.  . Melatonin 5 MG CAPS Take 5 mg by mouth at bedtime.   . meloxicam (MOBIC) 7.5 MG tablet Take 1 tablet (7.5 mg total) by mouth daily. (Patient taking differently: Take 7.5 mg by mouth as needed. )  . methocarbamol (ROBAXIN) 750 MG tablet Take 1 tablet (750 mg total) by mouth 3 (three) times daily as needed.  . modafinil (PROVIGIL) 100 MG tablet Take 1 tablet (100 mg total) by mouth 2 (two) times daily.  Marland Kitchen omeprazole (PRILOSEC) 20 MG capsule TAKE 1 CAPSULE DAILY  . psyllium (REGULOID) 0.52 g capsule Take 0.52 g by mouth daily as needed.  . tamsulosin (FLOMAX) 0.4 MG CAPS capsule Take 0.4 mg by mouth at bedtime.  . vitamin B-12 (CYANOCOBALAMIN) 1000 MCG tablet Take 1,000 mcg by mouth daily.  Marland Kitchen zolpidem (AMBIEN) 5 MG tablet Take 1  tablet (5 mg total) by mouth at bedtime as needed for sleep. (Patient taking differently: Take 5 mg by mouth at bedtime. )   No facility-administered encounter medications on file as of 05/26/2016.     Activities of Daily Living In your present state of health, do you have any difficulty performing the following activities: 05/26/2016  Hearing? Y  Vision? N  Difficulty concentrating or making decisions? N  Walking or climbing stairs? Y  Dressing or bathing? N  Doing errands, shopping? N  Preparing Food and eating ? N  Using the Toilet? N  In the past six months, have you accidently leaked urine? N  Do you have problems with loss of bowel control? N  Managing your Medications? N  Managing your Finances? N  Housekeeping or managing your Housekeeping? N  Some recent data might be hidden    Patient Care Team: Ria Bush, MD as PCP - General (Family Medicine) Star Age, MD as Attending Physician (Neurology) Marchia Bond, MD as Consulting Physician (Orthopedic Surgery) Rana Snare, MD as Consulting Physician (Urology) Inda Castle, MD as Consulting Physician (Gastroenterology) Wallene Huh, DPM as Consulting Physician (Podiatry)   Assessment:     Hearing Screening   125Hz  250Hz  500Hz  1000Hz  2000Hz  3000Hz  4000Hz  6000Hz  8000Hz   Right ear:   0 40 40  0    Left ear:   0 0 40  0    Vision Screening Comments: Last vision exam in Feb 2018 with Dr. George Ina   Exercise Activities and Dietary recommendations Current Exercise Habits: Structured exercise class;Home exercise routine, Type of exercise: Other - see comments;strength training/weights (physical therapy), Time (Minutes): 60, Frequency (Times/Week): 2, Weekly Exercise (Minutes/Week): 120, Intensity: Moderate, Exercise limited by: None identified  Goals    . Eat more fruits and vegetables          Starting 05/13/2015, I will begin eating at least 4-5 servings of fresh fruits and vegetables.     . Increase physical  activity          Starting 05/26/2016, I will continue to exercise at least 60 min 2 days per week and participate in physical therapy as advised.       Fall Risk Fall Risk  05/26/2016 05/13/2015  Falls in the past year? No No   Depression Screen PHQ 2/9 Scores 05/26/2016 05/13/2015  PHQ - 2 Score 2 0  PHQ- 9 Score 11 -    Cognitive Function MMSE - Mini Mental State Exam 05/26/2016 05/13/2015  Orientation to time 5 5  Orientation to Place  5 5  Registration 3 3  Attention/ Calculation 0 0  Recall 3 3  Language- name 2 objects 0 0  Language- repeat 1 1  Language- follow 3 step command 3 3  Language- read & follow direction 0 0  Write a sentence 0 0  Copy design 0 0  Total score 20 20     PLEASE NOTE: A Mini-Cog screen was completed. Maximum score is 20. A value of 0 denotes this part of Folstein MMSE was not completed or the patient failed this part of the Mini-Cog screening.   Mini-Cog Screening Orientation to Time - Max 5 pts Orientation to Place - Max 5 pts Registration - Max 3 pts Recall - Max 3 pts Language Repeat - Max 1 pts Language Follow 3 Step Command - Max 3 pts     Immunization History  Administered Date(s) Administered  . Hepatitis A 08/27/2007, 02/22/2008  . Influenza Split 11/10/2011  . Influenza,inj,Quad PF,36+ Mos 11/01/2012, 11/11/2013, 11/17/2014  . Influenza-Unspecified 12/03/2015  . Pneumococcal Conjugate-13 05/13/2015  . Pneumococcal Polysaccharide-23 05/26/2016  . Tdap 04/30/2009  . Zoster 11/26/2007   Screening Tests Health Maintenance  Topic Date Due  . INFLUENZA VACCINE  09/07/2016  . COLONOSCOPY  06/30/2017  . DTaP/Tdap/Td (2 - Td) 05/01/2019  . TETANUS/TDAP  05/01/2019  . Hepatitis C Screening  Completed  . PNA vac Low Risk Adult  Completed      Plan:     I have personally reviewed and addressed the Medicare Annual Wellness questionnaire and have noted the following in the patient's chart:  A. Medical and social history B. Use of  alcohol, tobacco or illicit drugs  C. Current medications and supplements D. Functional ability and status E.  Nutritional status F.  Physical activity G. Advance directives H. List of other physicians I.  Hospitalizations, surgeries, and ER visits in previous 12 months J.  [place] to include hearing, vision, cognitive, depression L. Referrals and appointments - none  In addition, I have reviewed and discussed with patient certain preventive protocols, quality metrics, and best practice recommendations. A written personalized care plan for preventive services as well as general preventive health recommendations were provided to patient.  See attached scanned questionnaire for additional information.   Signed,   Lindell Noe, MHA, BS, LPN Health Coach

## 2016-05-29 NOTE — Progress Notes (Signed)
I reviewed health advisor's note, was available for consultation, and agree with documentation and plan.  

## 2016-05-30 ENCOUNTER — Other Ambulatory Visit (INDEPENDENT_AMBULATORY_CARE_PROVIDER_SITE_OTHER): Payer: Medicare Other

## 2016-05-30 ENCOUNTER — Ambulatory Visit (INDEPENDENT_AMBULATORY_CARE_PROVIDER_SITE_OTHER): Payer: Medicare Other | Admitting: Family Medicine

## 2016-05-30 ENCOUNTER — Encounter: Payer: Self-pay | Admitting: Physical Therapy

## 2016-05-30 ENCOUNTER — Ambulatory Visit: Payer: Medicare Other | Admitting: Physical Therapy

## 2016-05-30 ENCOUNTER — Encounter: Payer: Self-pay | Admitting: Family Medicine

## 2016-05-30 VITALS — BP 140/82 | HR 76 | Temp 97.7°F | Wt 215.0 lb

## 2016-05-30 VITALS — BP 154/95 | HR 67

## 2016-05-30 DIAGNOSIS — E66811 Obesity, class 1: Secondary | ICD-10-CM

## 2016-05-30 DIAGNOSIS — Z7189 Other specified counseling: Secondary | ICD-10-CM | POA: Diagnosis not present

## 2016-05-30 DIAGNOSIS — R2689 Other abnormalities of gait and mobility: Secondary | ICD-10-CM | POA: Diagnosis not present

## 2016-05-30 DIAGNOSIS — G8929 Other chronic pain: Secondary | ICD-10-CM | POA: Diagnosis not present

## 2016-05-30 DIAGNOSIS — F331 Major depressive disorder, recurrent, moderate: Secondary | ICD-10-CM

## 2016-05-30 DIAGNOSIS — M25661 Stiffness of right knee, not elsewhere classified: Secondary | ICD-10-CM | POA: Diagnosis not present

## 2016-05-30 DIAGNOSIS — M25531 Pain in right wrist: Secondary | ICD-10-CM

## 2016-05-30 DIAGNOSIS — N400 Enlarged prostate without lower urinary tract symptoms: Secondary | ICD-10-CM | POA: Diagnosis not present

## 2016-05-30 DIAGNOSIS — E669 Obesity, unspecified: Secondary | ICD-10-CM | POA: Diagnosis not present

## 2016-05-30 DIAGNOSIS — R768 Other specified abnormal immunological findings in serum: Secondary | ICD-10-CM

## 2016-05-30 DIAGNOSIS — E538 Deficiency of other specified B group vitamins: Secondary | ICD-10-CM | POA: Diagnosis not present

## 2016-05-30 DIAGNOSIS — R7689 Other specified abnormal immunological findings in serum: Secondary | ICD-10-CM

## 2016-05-30 DIAGNOSIS — Z0000 Encounter for general adult medical examination without abnormal findings: Secondary | ICD-10-CM

## 2016-05-30 DIAGNOSIS — M25561 Pain in right knee: Secondary | ICD-10-CM | POA: Diagnosis not present

## 2016-05-30 LAB — BASIC METABOLIC PANEL
BUN: 18 mg/dL (ref 6–23)
CHLORIDE: 104 meq/L (ref 96–112)
CO2: 28 mEq/L (ref 19–32)
Calcium: 9.2 mg/dL (ref 8.4–10.5)
Creatinine, Ser: 1.15 mg/dL (ref 0.40–1.50)
GFR: 67.11 mL/min (ref >60.00)
Glucose, Bld: 89 mg/dL (ref 70–99)
POTASSIUM: 4.2 meq/L (ref 3.5–5.1)
SODIUM: 139 meq/L (ref 135–145)

## 2016-05-30 LAB — VITAMIN B12: VITAMIN B 12: 740 pg/mL (ref 211–911)

## 2016-05-30 NOTE — Assessment & Plan Note (Signed)
Sedentary lifestyle, limited by joint pains.

## 2016-05-30 NOTE — Assessment & Plan Note (Signed)
With anxiety. Caregiver stress reviewed with patient. Support provided. Discussed adjuvant med vs counseling. Will continue wellbutrin 300mg  XL for now.

## 2016-05-30 NOTE — Assessment & Plan Note (Signed)
Advanced directives: has at home but may not be updated. Advanced directive packet provided today. Wife would be HCPOA.

## 2016-05-30 NOTE — Assessment & Plan Note (Signed)
Followed by Dr Risa Grill, on flomax.

## 2016-05-30 NOTE — Therapy (Signed)
[place] MAIN [facility] SERVICES [address] [place], [place], [address] Phone: [phone]   Fax:  [phone]  Physical Therapy Treatment  Patient Details  Name: Gabriel [name] MRN: [id number] Date of Birth: 01-24-1946 No Data Recorded  Encounter Date: 05/30/2016      PT End of Session - 05/30/16 0956    Visit Number 11   Number of Visits 12   Date for PT Re-Evaluation 06/07/16   Authorization Type G Code (1/10)   PT Start Time 0956   PT Stop Time 1044   PT Time Calculation (min) 48 min   Activity Tolerance Patient tolerated treatment well;Patient limited by fatigue;Patient limited by pain   Behavior During Therapy [facility] for tasks assessed/performed      Past Medical History:  Diagnosis Date  . BPH (benign prostatic hypertrophy)    h/o prostate nodule, followed by Dr. Risa Grill yearly  . Cervical neck pain with evidence of disc disease    h/o bulging discs  . Chronic lower back pain    DDD, prior on lyrica, gabapentin, lumbar stenosis with persistent numbness R ant thigh, ESI didn't help, now seeing [facility]  . Depression with anxiety    h/o remote hospitalization for depression  . GERD (gastroesophageal reflux disease)   . Hematuria - cause not known    s/p urology workup 2010, nl CT, cystoscopy (doesn't want rpt), cytology  . History of chicken pox   . History of colon polyps 2005   tubular adenoma, on rpt no more (2010)  . Insomnia    Dohmeier  . OSA on CPAP    cpap machine at 10cm, ambien, daytime drowsiness attributed to OSA, last changed dextroamphetamine to nuvigil, previously tried provigil  . Prostate nodule    followed by Risa Grill, reassuring exam and PSA  . Seronegative arthritis    RA, previously on MTX, RF <7, ANA nl (rheum Dr. Donnamarie Poag)  . Torn rotator cuff    bilateral, s/p L repair and now recurrent, chronic massive R RTC tear (supraspinatus)    Past Surgical History:  Procedure Laterality Date  . ACHILLES TENDON  REPAIR  2002  . CATARACT EXTRACTION  2007   left  . COLONOSCOPY  11/2008   diverticulosis, rpt 5 yrs  . COLONOSCOPY  07/2014   2 TA, mild diverticulosis, rpt 5 yrs [name])  . DOBUTAMINE STRESS ECHO  08/2006   negative for ischemia  . LUMBAR DISC SURGERY  07/2010   L3/4 diskectomy (Dr. Harl Bowie at [facility])  . MRI thoracic  09/2010   thoracic DDD/kyphosis, minimal anterolisthesis T2/3 with B foraminal narrowing  . ROTATOR CUFF REPAIR  2008   Left (Dr. Gaylyn Rong in [place], now [place])  . UPPER GASTROINTESTINAL ENDOSCOPY  02/2005   minimal GERD, small gastric polyp, tiny HH    Vitals:   05/30/16 1003  BP: (!) 154/95  Pulse: 67        Subjective Assessment - 05/30/16 1002    Subjective Pt reports he wishes he could walk more normally and finds himself experiencing pain if he walks long distances.  He reports he has difficulty picking up his feet when he is walking.  Pt reports he went to the [facility] yesterday and did the bike, elliptical, and leg press with no pain.    Pertinent History sleep apnea; previous achilles tendon repair on the L LE,    Limitations Standing;Walking   How long can you stand comfortably? 64min   How long can you walk  comfortably? 84min   Patient Stated Goals walking longer and standing without pain.   Currently in Pain? Yes   Pain Score 2    Pain Location Knee   Pain Orientation Right   Pain Descriptors / Indicators Aching   Pain Type Chronic pain   Pain Onset More than a month ago   Multiple Pain Sites No       TREATMENT:   Manual Therapy:  Patellar mobilizations with patient in long sitting Superior/inferior; laterally grade IV for 3 x 30sec to allow for proper patellar mechanics when bending the knee.    Therapeutic exercises:  SLR in long sitting - 3 x 20 in long sitting. Pt demonstrates and reports fatigue at end of last set.  Standing hip raises on 6" step for glute activation 2x10 each LE. Pt reports mild medial R knee pain, cues to squeeze  buttocks when powering up with improvement in pain but discontinued on R after 5 reps into second set due to persistent pain.  Mini Squat with frequent TC and VC on hip positioning and to pretend like he is preparing to sit in a chair- ?x 20  Step ups onto 8" step - 2 x 20  Leg Press on the R leg only -75# 1x15, 75#+ 5# ankle weight on weight stack - 1x15  Resisted walking forward, L, and R x2 each direction with 12.5# resistance  Standing resisted hip flexion with RTB x20 each LE  Noted significant L trendelenburg as pt ambulating into clinic at start of session. Ambulation with focus on performing heel strike B and glute activation - x 253ft            PT Education - 05/30/16 0955    Education provided Yes   Education Details Exercise technique   Person(s) Educated Patient   Methods Explanation;Demonstration   Comprehension Verbalized understanding;Returned demonstration;Need further instruction             PT Long Term Goals - 05/26/16 1507      PT LONG TERM GOAL #1   Title Pt will be independent with HEP to continue with benefits of therapy after discharge from physical therapy.    Baseline Dependent with exercise form/technique; 05/26/2016: Moderate cueing for exercise performance   Time 6   Period Weeks   Status On-going     PT LONG TERM GOAL #2   Title Pt will score 60/80 on the LEFS to demonstrate significant improvement in LE function and greater ability to walk without feeling fatigue.    Baseline 41/80; 05/26/2016: LEFS: 53/80   Time 6   Period Weeks   Status On-going     PT LONG TERM GOAL #3   Title Patient will score a 0/10 on the VAS as the worst pain in the past week to demonstrate improvement in knee function and pain.    Baseline 7/10 05/26/2016: 3/10   Time 6   Period Weeks   Status On-going     PT LONG TERM GOAL #4   Title Patient will score >1.22m/s on 69m walk test to demonstrate signifcant improvement in  gait speed and decrease in fall risk.     Baseline .23m/s 05/26/2016: .65m/s   Time 6   Period Weeks   Status On-going               Plan - 05/30/16 1046    Clinical Impression Statement Pt demonstrating significant L trendelenberg when ambulating into clinic so addressed this by incorporating glute activation  and strengthening this session.  Pt with poor body awareness but this seems to have improved compared to start of therapy.  Pt demonstrates fatigue with hip flexion exercises which is likely attributing to pt's difficulty "picking up my feet" when ambulating.  Pt performed resisted walking with good control with cues to complete slowly.  Pt will benefit from continued skiled PT interventions for improved strength and gait mechanics and decreased pain.   Rehab Potential Good   Clinical Impairments Affecting Rehab Potential (+) highly motivated, family support; (-) previous achilles tear, chronic knee pain   PT Frequency 2x / week   PT Duration 6 weeks   PT Treatment/Interventions Passive range of motion;Dry needling;Manual techniques;Balance training;Therapeutic exercise;Therapeutic activities;Patient/family education;Stair training;Gait training;Neuromuscular re-education;Iontophoresis 4mg /ml Dexamethasone;Moist Heat;Ultrasound;Electrical Stimulation   PT Next Visit Plan Progress strengthening, decreasing extensor lag, and quad control   Consulted and Agree with Plan of Care Patient      Patient will benefit from skilled therapeutic intervention in order to improve the following deficits and impairments:  Abnormal gait, Increased fascial restricitons, Pain, Improper body mechanics, Decreased mobility, Increased muscle spasms, Decreased range of motion, Decreased endurance, Decreased strength, Decreased activity tolerance, Decreased balance, Difficulty walking  Visit Diagnosis: Chronic pain of right knee  Stiffness of right knee, not elsewhere classified  Other abnormalities of gait and mobility     Problem  List Patient Active Problem List   Diagnosis Date Noted  . Advanced care planning/counseling discussion 05/30/2016  . Right wrist pain 05/30/2016  . Positive ANA (antinuclear antibody) 02/22/2016  . Right knee pain 11/28/2015  . External hemorrhoid 07/13/2015  . Hearing loss of right ear due to cerumen impaction 05/28/2015  . Obesity, Class I, BMI 30-34.9 05/28/2015  . Benign prostatic hyperplasia   . Medicare annual wellness visit, initial 05/13/2015  . Vitamin B12 deficiency 04/13/2013  . Fatigue 04/12/2013  . Left shoulder pain 09/10/2012  . DDD (degenerative disc disease), lumbar 12/06/2011  . Right cervical radiculopathy 08/31/2011  . Acquired deformity of nail 08/13/2011  . Healthcare maintenance 06/04/2011  . Insomnia 03/04/2011  . Right shoulder pain 01/06/2011  . Chronic lower back pain   . Hypersomnolence 12/02/2010  . Hematuria - cause not known   . MDD (major depressive disorder), recurrent episode, moderate ([place])   . GERD (gastroesophageal reflux disease)   . OSA (obstructive sleep apnea)   . Torn rotator cuff   . Colon polyp     Collie Siad PT, DPT 05/30/2016, 10:49 AM  [place] MAIN [facility] SERVICES [address] [place], [place], [address] Phone: [phone]   Fax:  [phone]  Name: Gabriel [name] MRN: [id number] Date of Birth: 01-20-1946

## 2016-05-30 NOTE — Progress Notes (Signed)
Pre visit review using our clinic review tool, if applicable. No additional management support is needed unless otherwise documented below in the visit note. 

## 2016-05-30 NOTE — Patient Instructions (Addendum)
Check with Dr Risa Grill about finasteride in place of flomax.  Check with insurance about coverage for shingrix (new shingles vaccine).  Work on potassium rich foods.  Advanced directive packet provided today.  Return as needed or in 6 months for follow up visit.  Good to see you today, call us with questions.

## 2016-05-30 NOTE — Progress Notes (Signed)
BP 140/82   Pulse 76   Temp 97.7 F (36.5 C) (Oral)   Wt 215 lb (97.5 kg)   BMI 33.67 kg/m    CC: AMW f/u visit Subjective:    Patient ID: Gabriel [name], male    DOB: 1946-01-10, 71 y.o.   MRN: [id number]  HPI: Gabriel [name] is a 71 y.o. male presenting on 05/30/2016 for Annual Exam   Mother passed away Thanksgiving.   Saw Lesia last week for medicare wellness visit. Note reviewed. Positive depression screen.  Caregiver for his wife with significant medical problems s/p stroke 30 yrs ago. Caregiver stress. Stress over wife's care, stress over retiring. High anxiety. Discussed self care. Has previously seen psychiatrist and counselor. Not interested in counseling at this time. 2 sons - one adopted and one biological.   R knee arthroscopy 03/10/2016 Mardelle Matte) - meniscal repair and loose body removed. Undergoing physical therapy.   Some R wrist constriction pressure sensation worse in am. Occasional bilateral finger numbness. No burning pain or paresthesias. No pain, swelling, redness or warmth.   Recurrent cyst on back.  Sneezing and rhinorrhea but not associated with seasonal.   Preventative: COLONOSCOPY 07/2014; 2 TA, mild diverticulosis, rpt 5 yrs [name]) Prostate - followed yearly by urology. Has established with Dr. Risa Grill.  Possible prostate nodule, but PSA always ok per pt.  Flu shot yearly Pneumovax 2018, prevnar 2017 Tdap 04/2009 zostavax 2009.  Advanced directives: has at home but may not be updated. Advanced directive packet provided today. Wife would be HCPOA.  Seat belt use discussed.  Sunscreen use discussed. No changing moles on skin.  Never smoker  Alcohol - none   Caffeine: avoids Lives with wife - who is in a power chair. Occupation: Scientist, product/process development, [employer] Edu: masters + Likes to travel Activity: joined [employer], goes several times a week Diet: some water, some fruits/vegetables  Relevant past medical, surgical, family and social history reviewed and  updated as indicated. Interim medical history since our last visit reviewed. Allergies and medications reviewed and updated. Outpatient Medications Prior to Visit  Medication Sig Dispense Refill  . acetaminophen (TYLENOL) 500 MG tablet Take 500 mg by mouth every 6 (six) hours as needed.    Marland Kitchen buPROPion (WELLBUTRIN XL) 300 MG 24 hr tablet TAKE 1 TABLET DAILY 90 tablet 0  . HYDROcodone-acetaminophen (NORCO/VICODIN) 5-325 MG tablet Take 1 tablet by mouth as needed for moderate pain.    Marland Kitchen loratadine (CLARITIN) 10 MG tablet Take 10 mg by mouth daily as needed for allergies.    . Melatonin 5 MG CAPS Take 5 mg by mouth at bedtime.     . methocarbamol (ROBAXIN) 750 MG tablet Take 1 tablet (750 mg total) by mouth 3 (three) times daily as needed. 270 tablet 1  . modafinil (PROVIGIL) 100 MG tablet Take 1 tablet (100 mg total) by mouth 2 (two) times daily. 180 tablet 3  . omeprazole (PRILOSEC) 20 MG capsule TAKE 1 CAPSULE DAILY 90 capsule 3  . psyllium (REGULOID) 0.52 g capsule Take 0.52 g by mouth daily as needed.    . tamsulosin (FLOMAX) 0.4 MG CAPS capsule Take 0.4 mg by mouth at bedtime.    . vitamin B-12 (CYANOCOBALAMIN) 1000 MCG tablet Take 1,000 mcg by mouth daily.    Marland Kitchen zolpidem (AMBIEN) 5 MG tablet Take 1 tablet (5 mg total) by mouth at bedtime as needed for sleep. (Patient taking differently: Take 5 mg by mouth at bedtime. ) 90 tablet 1  .  meloxicam (MOBIC) 7.5 MG tablet Take 1 tablet (7.5 mg total) by mouth daily. (Patient taking differently: Take 7.5 mg by mouth as needed. ) 30 tablet 3   No facility-administered medications prior to visit.      Per HPI unless specifically indicated in ROS section below Review of Systems     Objective:    BP 140/82   Pulse 76   Temp 97.7 F (36.5 C) (Oral)   Wt 215 lb (97.5 kg)   BMI 33.67 kg/m   Wt Readings from Last 3 Encounters:  05/30/16 215 lb (97.5 kg)  05/26/16 215 lb 12 oz (97.9 kg)  02/22/16 215 lb 8 oz (97.8 kg)    Physical Exam    Constitutional: He is oriented to person, place, and time. He appears well-developed and well-nourished. No distress.  HENT:  Head: Normocephalic and atraumatic.  Right Ear: Hearing, tympanic membrane, external ear and ear canal normal.  Left Ear: Hearing, tympanic membrane, external ear and ear canal normal.  Nose: Nose normal.  Mouth/Throat: Uvula is midline, oropharynx is clear and moist and mucous membranes are normal. No oropharyngeal exudate, posterior oropharyngeal edema or posterior oropharyngeal erythema.  Eyes: Conjunctivae and EOM are normal. Pupils are equal, round, and reactive to light. No scleral icterus.  Neck: Normal range of motion. Neck supple.  Cardiovascular: Normal rate, regular rhythm, normal heart sounds and intact distal pulses.   No murmur heard. Pulses:      Radial pulses are 2+ on the right side, and 2+ on the left side.  Pulmonary/Chest: Effort normal and breath sounds normal. No respiratory distress. He has no wheezes. He has no rales.  Abdominal: Soft. Bowel sounds are normal. He exhibits no distension and no mass. There is no tenderness. There is no rebound and no guarding.  Musculoskeletal: Normal range of motion. He exhibits no edema.  FROM at bilateral wrists No pain at anatomical snuff box No pain with finkelstein test  Lymphadenopathy:    He has no cervical adenopathy.  Neurological: He is alert and oriented to person, place, and time.  CN grossly intact, station and gait intact  Skin: Skin is warm and dry. No rash noted.  Psychiatric: He has a normal mood and affect. His behavior is normal. Judgment and thought content normal.  Nursing note and vitals reviewed.  Results for orders placed or performed in visit on 16/10/96  Basic metabolic panel  Result Value Ref Range   Sodium 142 135 - 145 mEq/L   Potassium 3.5 3.5 - 5.1 mEq/L   Chloride 107 96 - 112 mEq/L   CO2 27 19 - 32 mEq/L   Glucose, Bld 91 70 - 99 mg/dL   BUN 8 6 - 23 mg/dL    Creatinine, Ser 0.86 0.40 - 1.50 mg/dL   Calcium 8.9 8.4 - 10.5 mg/dL   GFR 93.84 >60.00 mL/min  Vitamin B12  Result Value Ref Range   Vitamin B-12 363 211 - 911 pg/mL      Assessment & Plan:  Over 25 minutes were spent face-to-face with the patient during this encounter and >50% of that time was spent on counseling and coordination of care  Problem List Items Addressed This Visit    Advanced care planning/counseling discussion    Advanced directives: has at home but may not be updated. Advanced directive packet provided today. Wife would be HCPOA.       Benign prostatic hyperplasia    Followed by Dr Risa Grill, on flomax.  MDD (major depressive disorder), recurrent episode, moderate ([place]) - [facility]    With anxiety. Caregiver stress reviewed with patient. Support provided. Discussed adjuvant med vs counseling. Will continue wellbutrin 300mg  XL for now.       Obesity, Class I, BMI 30-34.9    Sedentary lifestyle, limited by joint pains.       Positive ANA (antinuclear antibody)    Autoimmune labs were ok 02/2016. Will continue to monitor positive ANA.       Right knee pain    s/p arthroscopy with meniscal repair and loose body removal ([place]). Undergoing physical therapy.       Right wrist pain    Exam WNL. Continue to monitor.       Vitamin B12 deficiency    b12 level stable. Compliant with oral B12 supplement daily.           Follow up plan: Return in about 6 months (around 11/29/2016) for follow up visit.  Ria Bush, MD

## 2016-05-30 NOTE — Assessment & Plan Note (Signed)
b12 level stable. Compliant with oral B12 supplement daily.

## 2016-05-30 NOTE — Assessment & Plan Note (Signed)
Autoimmune labs were ok 02/2016. Will continue to monitor positive ANA.

## 2016-05-30 NOTE — Assessment & Plan Note (Signed)
Exam WNL. Continue to monitor.

## 2016-05-30 NOTE — Assessment & Plan Note (Signed)
s/p arthroscopy with meniscal repair and loose body removal ([place]). Undergoing physical therapy.

## 2016-05-31 ENCOUNTER — Encounter: Payer: Self-pay | Admitting: *Deleted

## 2016-06-01 ENCOUNTER — Encounter: Payer: Self-pay | Admitting: Physical Therapy

## 2016-06-01 ENCOUNTER — Ambulatory Visit: Payer: Medicare Other | Admitting: Physical Therapy

## 2016-06-01 DIAGNOSIS — R2689 Other abnormalities of gait and mobility: Secondary | ICD-10-CM | POA: Diagnosis not present

## 2016-06-01 DIAGNOSIS — M25561 Pain in right knee: Principal | ICD-10-CM

## 2016-06-01 DIAGNOSIS — M25661 Stiffness of right knee, not elsewhere classified: Secondary | ICD-10-CM | POA: Diagnosis not present

## 2016-06-01 DIAGNOSIS — G8929 Other chronic pain: Secondary | ICD-10-CM

## 2016-06-01 NOTE — Therapy (Signed)
[place] MAIN [facility] SERVICES [address] [place], [place], [address] Phone: [phone]   Fax:  [phone]  Physical Therapy Treatment  Patient Details  Name: Gabriel [name] MRN: [id number] Date of Birth: [birth year] No Data Recorded  Encounter Date: 06/01/2016      PT End of Session - 06/01/16 1307    Visit Number 12   Number of Visits 16   Date for PT Re-Evaluation 06/29/16   Authorization Type G Code 2/10   PT Start Time 1300   PT Stop Time 1345   PT Time Calculation (min) 45 min   Activity Tolerance Patient tolerated treatment well;Patient limited by fatigue;Patient limited by pain   Behavior During Therapy [facility] for tasks assessed/performed      Past Medical History:  Diagnosis Date  . BPH (benign prostatic hypertrophy)    h/o prostate nodule, followed by Dr. Risa Grill yearly  . Cervical neck pain with evidence of disc disease    h/o bulging discs  . Chronic lower back pain    DDD, prior on lyrica, gabapentin, lumbar stenosis with persistent numbness R ant thigh, ESI didn't help, now seeing [facility]  . Depression with anxiety    h/o remote hospitalization for depression  . GERD (gastroesophageal reflux disease)   . Hematuria - cause not known    s/p urology workup 2010, nl CT, cystoscopy (doesn't want rpt), cytology  . History of chicken pox   . History of colon polyps 2005   tubular adenoma, on rpt no more (2010)  . Insomnia    [place]  . OSA on CPAP    cpap machine at 10cm, ambien, daytime drowsiness attributed to OSA, last changed dextroamphetamine to nuvigil, previously tried provigil  . Prostate nodule    followed by Risa Grill, reassuring exam and PSA  . Seronegative arthritis    RA, previously on MTX, RF <7, ANA nl (rheum Dr. Donnamarie Poag)  . Torn rotator cuff    bilateral, s/p L repair and now recurrent, chronic massive R RTC tear (supraspinatus)    Past Surgical History:  Procedure Laterality Date  . ACHILLES TENDON  REPAIR  2002  . CATARACT EXTRACTION  2007   left  . COLONOSCOPY  11/2008   diverticulosis, rpt 5 yrs  . COLONOSCOPY  07/2014   2 TA, mild diverticulosis, rpt 5 yrs [name])  . DOBUTAMINE STRESS ECHO  08/2006   negative for ischemia  . LUMBAR DISC SURGERY  07/2010   L3/4 diskectomy ([facility]. Harl [facility])  . MRI thoracic  09/2010   thoracic DDD/kyphosis, minimal anterolisthesis T2/3 with B foraminal narrowing  . ROTATOR CUFF REPAIR  2008   Left (Dr. Gaylyn Rong in [place], now [place])  . UPPER GASTROINTESTINAL ENDOSCOPY  02/2005   minimal GERD, small gastric polyp, tiny HH    There were no vitals filed for this visit.      Subjective Assessment - 06/01/16 1306    Subjective Patient reports doing well; He reports minimal pain today; He reports that he still gets some pain when walking long distances;    Pertinent History sleep apnea; previous achilles tendon repair on the L LE,    Limitations Standing;Walking   How long can you stand comfortably? 82min   How long can you walk comfortably? 40min   Patient Stated Goals walking longer and standing without pain.   Currently in Pain? Yes   Pain Score 1    Pain Location Knee   Pain Orientation Right   Pain  Descriptors / Indicators Nagging   Pain Type Chronic pain   Pain Onset More than a month ago   Pain Frequency Intermittent   Aggravating Factors  standing and walking long distances   Pain Relieving Factors ice/rest   Effect of Pain on Daily Activities can't walk as long;    Multiple Pain Sites No        TREATMENT:  Warm up on Nustep BLE only level 2 x5 min (unbilled)   Therapeutic exercises:   Step ups on 8 inch step unsupported x5 each LE leading; Side step up on 8 inch step with SLS eccentric lower with cues to avoid weight shift and isolate single leg eccentric lower x10 each LE with BUE HHA on parallel bars for balance; Patient reports increased difficulty with each LE but no increase in pain;   Resisted walking  forward/backward, side/side 4way, x2 laps each with 12.5# resistance with min VCs to slow down for better gait safety;   Green tband around both legs: Side stepping x10 feet x3 laps each direction unsupported with cues to increase step length for better hip abductor strengthening;  Diagonal stepping forward/backward x10 feet x2 laps each direction, unsupported, close supervision for safety with cues to increase ROM for better hip abductor strengthening; Requires min Vcs for sequencing;  Standing: Green tband RLE knee terminal knee extension x15 reps with mod VCs for posture and positioning and to isolate RLE knee extension for better quad strengthening;  Standing back to wall: RLE terminal knee extension against ball on wall 2x10 with min Vcs for positioning and mod VCs to slow down LE movement for quad strengthening and stability;  Patient in prone: RLE Knee flexion AROM x10 reps for flexibility; Gluteal max hip extension (hip extension with knee bent) x10 bilaterally with min Vcs for positioning; Patient denies any increase in back pain but does report difficulty with hip extension due to weakness;  Knee AROM following session: Supine: R: 2-120 L: 0-130   Patient reports no pain at end of treatment session;                         PT Education - 06/01/16 1307    Education provided Yes   Education Details exercise technique/strengthening, ROM   Person(s) Educated Patient   Methods Explanation;Verbal cues   Comprehension Verbalized understanding;Returned demonstration;Verbal cues required             PT Long Term Goals - 06/01/16 1402      PT LONG TERM GOAL #1   Title Pt will be independent with HEP to continue with benefits of therapy after discharge from physical therapy.    Baseline Dependent with exercise form/technique; 05/26/2016: Moderate cueing for exercise performance   Time 4   Period Weeks   Status On-going     PT LONG TERM GOAL #2   Title  Pt will score 60/80 on the LEFS to demonstrate significant improvement in LE function and greater ability to walk without feeling fatigue.    Baseline 41/80; 05/26/2016: LEFS: 53/80   Time 4   Period Weeks   Status On-going     PT LONG TERM GOAL #3   Title Patient will score a 0/10 on the VAS as the worst pain in the past week to demonstrate improvement in knee function and pain.    Baseline 7/10 05/26/2016: 3/10   Time 4   Period Weeks   Status On-going     PT LONG  TERM GOAL #4   Title Patient will score >1.19m/s on 24m walk test to demonstrate signifcant improvement in  gait speed and decrease in fall risk.    Baseline .71m/s 05/26/2016: .28m/s   Time 4   Period Weeks   Status On-going               Plan - 06/01/16 1401    Clinical Impression Statement Patient instructed in LE strengthening. He has difficulty with advanced exercise due to hip/knee weakness. He requires cues for correct exercise technique, particularly terminal knee extension. Patient able to demonstrate better gait ability with less trendelenburg after treatment session. He would benefit from additional skilled PT intervention to improve strength, balance and gait safety;    Rehab Potential Good   Clinical Impairments Affecting Rehab Potential (+) highly motivated, family support; (-) previous achilles tear, chronic knee pain   PT Frequency 1x / week   PT Duration 4 weeks   PT Treatment/Interventions Passive range of motion;Dry needling;Manual techniques;Balance training;Therapeutic exercise;Therapeutic activities;Patient/family education;Stair training;Gait training;Neuromuscular re-education;Iontophoresis 4mg /ml Dexamethasone;Moist Heat;Ultrasound;Electrical Stimulation   PT Next Visit Plan Progress strengthening, decreasing extensor lag, and quad control   Consulted and Agree with Plan of Care Patient      Patient will benefit from skilled therapeutic intervention in order to improve the following deficits  and impairments:  Abnormal gait, Increased fascial restricitons, Pain, Improper body mechanics, Decreased mobility, Increased muscle spasms, Decreased range of motion, Decreased endurance, Decreased strength, Decreased activity tolerance, Decreased balance, Difficulty walking  Visit Diagnosis: Chronic pain of right knee - Plan: PT plan of care cert/re-cert  Stiffness of right knee, not elsewhere classified - Plan: PT plan of care cert/re-cert  Other abnormalities of gait and mobility - Plan: PT plan of care cert/re-cert     Problem List Patient Active Problem List   Diagnosis Date Noted  . Advanced care planning/counseling discussion 05/30/2016  . Right wrist pain 05/30/2016  . Positive ANA (antinuclear antibody) 02/22/2016  . Right knee pain 11/28/2015  . External hemorrhoid 07/13/2015  . Hearing loss of right ear due to cerumen impaction 05/28/2015  . Obesity, Class I, BMI 30-34.9 05/28/2015  . Benign prostatic hyperplasia   . Medicare annual wellness visit, initial 05/13/2015  . Vitamin B12 deficiency 04/13/2013  . Fatigue 04/12/2013  . Left shoulder pain 09/10/2012  . DDD (degenerative disc disease), lumbar 12/06/2011  . Right cervical radiculopathy 08/31/2011  . Acquired deformity of nail 08/13/2011  . Healthcare maintenance 06/04/2011  . Insomnia 03/04/2011  . Right shoulder pain 01/06/2011  . Chronic lower back pain   . Hypersomnolence 12/02/2010  . Hematuria - cause not known   . MDD (major depressive disorder), recurrent episode, moderate ([place])   . GERD (gastroesophageal reflux disease)   . OSA (obstructive sleep apnea)   . Torn rotator cuff   . Colon polyp     Brystal Kildow PT, DPT 06/01/2016, 4:13 PM  [place] MAIN [facility] SERVICES [address] [place], [place], [address] Phone: [phone]   Fax:  [phone]  Name: Gabriel [name] MRN: [id number] Date of Birth: 1945-07-17

## 2016-06-02 ENCOUNTER — Ambulatory Visit: Payer: Medicare Other

## 2016-06-06 ENCOUNTER — Encounter: Payer: Self-pay | Admitting: Physical Therapy

## 2016-06-06 ENCOUNTER — Ambulatory Visit: Payer: Medicare Other | Admitting: Physical Therapy

## 2016-06-06 DIAGNOSIS — M25561 Pain in right knee: Principal | ICD-10-CM

## 2016-06-06 DIAGNOSIS — G8929 Other chronic pain: Secondary | ICD-10-CM | POA: Diagnosis not present

## 2016-06-06 DIAGNOSIS — M25661 Stiffness of right knee, not elsewhere classified: Secondary | ICD-10-CM | POA: Diagnosis not present

## 2016-06-06 DIAGNOSIS — R2689 Other abnormalities of gait and mobility: Secondary | ICD-10-CM | POA: Diagnosis not present

## 2016-06-06 NOTE — Therapy (Signed)
[place] MAIN [facility] SERVICES [address] [place], [place], [address] Phone: [phone]   Fax:  [phone]  Physical Therapy Treatment  Patient Details  Name: Gabriel [name] MRN: [id number] Date of Birth: 02/20/1945 No Data Recorded  Encounter Date: 06/06/2016      PT End of Session - 06/06/16 1623    Visit Number 13   Number of Visits 16   Date for PT Re-Evaluation 06/29/16   Authorization Type G Code 3/10   PT Start Time 0415   PT Stop Time 0500   PT Time Calculation (min) 45 min   Activity Tolerance Patient tolerated treatment well;Patient limited by fatigue;Patient limited by pain   Behavior During Therapy [facility] for tasks assessed/performed      Past Medical History:  Diagnosis Date  . BPH (benign prostatic hypertrophy)    h/o prostate nodule, followed by Dr. Risa Grill yearly  . Cervical neck pain with evidence of disc disease    h/o bulging discs  . Chronic lower back pain    DDD, prior on lyrica, gabapentin, lumbar stenosis with persistent numbness R ant thigh, ESI didn't help, now seeing [facility]  . Depression with anxiety    h/o remote hospitalization for depression  . GERD (gastroesophageal reflux disease)   . Hematuria - cause not known    s/p urology workup 2010, nl CT, cystoscopy (doesn't want rpt), cytology  . History of chicken pox   . History of colon polyps 2005   tubular adenoma, on rpt no more (2010)  . Insomnia    Dohmeier  . OSA on CPAP    cpap machine at 10cm, ambien, daytime drowsiness attributed to OSA, last changed dextroamphetamine to nuvigil, previously tried provigil  . Prostate nodule    followed by Risa Grill, reassuring exam and PSA  . Seronegative arthritis    RA, previously on MTX, RF <7, ANA nl (rheum Dr. Donnamarie Poag)  . Torn rotator cuff    bilateral, s/p L repair and now recurrent, chronic massive R RTC tear (supraspinatus)    Past Surgical History:  Procedure Laterality Date  . ACHILLES TENDON  REPAIR  2002  . CATARACT EXTRACTION  2007   left  . COLONOSCOPY  11/2008   diverticulosis, rpt 5 yrs  . COLONOSCOPY  07/2014   2 TA, mild diverticulosis, rpt 5 yrs [name])  . DOBUTAMINE STRESS ECHO  08/2006   negative for ischemia  . LUMBAR DISC SURGERY  07/2010   L3/4 diskectomy (Dr. Harl [facility] at [facility])  . MRI thoracic  09/2010   thoracic DDD/kyphosis, minimal anterolisthesis T2/3 with B foraminal narrowing  . ROTATOR CUFF REPAIR  2008   Left (Dr. Gaylyn Rong in [place], now [place])  . UPPER GASTROINTESTINAL ENDOSCOPY  02/2005   minimal GERD, small gastric polyp, tiny HH    There were no vitals filed for this visit.      Subjective Assessment - 06/06/16 1621    Subjective Patient reports doing well; He reports minimal pain today; He reports that he still gets some pain when walking long distances;    Pertinent History sleep apnea; previous achilles tendon repair on the L LE,    Limitations Standing;Walking   How long can you stand comfortably? 25 mins   How long can you walk comfortably? 15 mins   Patient Stated Goals walking longer and standing without pain.   Currently in Pain? Yes   Pain Score 2    Pain Location Knee   Pain Orientation Right  Pain Descriptors / Indicators Aching;Nagging   Pain Type Chronic pain   Pain Onset More than a month ago   Pain Frequency Intermittent   Multiple Pain Sites No      Therapeutic exercises:  SLR with ER and intervals  - 2 x 15 Pt demonstrates and reports fatigue at end of last set.  Mini Squat with frequent  VC on hip positioning x  20  Prone hip extension with knee flex x 15 x 2  Wall knee squeeze with theraball x 30 sec x 3 sets Step ups onto 8" step - 2 x 20  Leg Press on the R leg only -75# 1x15, 75# , heel raises x 20 75 lbs Matrix 7. 5 lbs  R x2  Hip abd and hip ext  with 7.5 # resistance   Patient  less  L trendelenburg as pt ambulating into clinic at start of session. Ambulation with focus on performing heel strike B and glute  activation - x 238ft                              PT Education - 06/06/16 1623    Education provided Yes   Education Details cues for correct technique   Person(s) Educated Patient   Methods Explanation   Comprehension Verbalized understanding             PT Long Term Goals - 06/01/16 1402      PT LONG TERM GOAL #1   Title Pt will be independent with HEP to continue with benefits of therapy after discharge from physical therapy.    Baseline Dependent with exercise form/technique; 05/26/2016: Moderate cueing for exercise performance   Time 4   Period Weeks   Status On-going     PT LONG TERM GOAL #2   Title Pt will score 60/80 on the LEFS to demonstrate significant improvement in LE function and greater ability to walk without feeling fatigue.    Baseline 41/80; 05/26/2016: LEFS: 53/80   Time 4   Period Weeks   Status On-going     PT LONG TERM GOAL #3   Title Patient will score a 0/10 on the VAS as the worst pain in the past week to demonstrate improvement in knee function and pain.    Baseline 7/10 05/26/2016: 3/10   Time 4   Period Weeks   Status On-going     PT LONG TERM GOAL #4   Title Patient will score >1.52m/s on 1m walk test to demonstrate signifcant improvement in  gait speed and decrease in fall risk.    Baseline .25m/s 05/26/2016: .9m/s   Time 4   Period Weeks   Status On-going               Plan - 06/06/16 1624    Clinical Impression Statement Patient instructed in LE strengthening.Marland Kitchen He requires cues for correct exercise technique, particularly prone hip extension and heel raises with leg press. Patient has 4+/5 strength BLE hips. PROM right knee 0-122 deg.  Patient able to demonstrate better gait ability with less trendelenburg after treatment session. He would benefit from additional skilled PT intervention to improve strength, balance and gait safety;    Rehab Potential Good   Clinical Impairments Affecting Rehab Potential  (+) highly motivated, family support; (-) previous achilles tear, chronic knee pain   PT Frequency 1x / week   PT Duration 4 weeks   PT Treatment/Interventions Passive  range of motion;Dry needling;Manual techniques;Balance training;Therapeutic exercise;Therapeutic activities;Patient/family education;Stair training;Gait training;Neuromuscular re-education;Iontophoresis 4mg /ml Dexamethasone;Moist Heat;Ultrasound;Electrical Stimulation   PT Next Visit Plan Progress strengthening, decreasing extensor lag, and quad control   Consulted and Agree with Plan of Care Patient      Patient will benefit from skilled therapeutic intervention in order to improve the following deficits and impairments:  Abnormal gait, Increased fascial restricitons, Pain, Improper body mechanics, Decreased mobility, Increased muscle spasms, Decreased range of motion, Decreased endurance, Decreased strength, Decreased activity tolerance, Decreased balance, Difficulty walking  Visit Diagnosis: Chronic pain of right knee  Stiffness of right knee, not elsewhere classified     Problem List Patient Active Problem List   Diagnosis Date Noted  . Advanced care planning/counseling discussion 05/30/2016  . Right wrist pain 05/30/2016  . Positive ANA (antinuclear antibody) 02/22/2016  . Right knee pain 11/28/2015  . External hemorrhoid 07/13/2015  . Hearing loss of right ear due to cerumen impaction 05/28/2015  . Obesity, Class I, BMI 30-34.9 05/28/2015  . Benign prostatic hyperplasia   . Medicare annual wellness visit, initial 05/13/2015  . Vitamin B12 deficiency 04/13/2013  . Fatigue 04/12/2013  . Left shoulder pain 09/10/2012  . DDD (degenerative disc disease), lumbar 12/06/2011  . Right cervical radiculopathy 08/31/2011  . Acquired deformity of nail 08/13/2011  . Healthcare maintenance 06/04/2011  . Insomnia 03/04/2011  . Right shoulder pain 01/06/2011  . Chronic lower back pain   . Hypersomnolence 12/02/2010  .  Hematuria - cause not known   . MDD (major depressive disorder), recurrent episode, moderate ([place])   . GERD (gastroesophageal reflux disease)   . OSA (obstructive sleep apnea)   . Torn rotator cuff   . Colon polyp    Alanson Puls, PT, DPT Arelia Sneddon S 06/06/2016, 4:43 PM  [place] MAIN [facility] SERVICES [address] [place], [place], [address] Phone: [phone]   Fax:  [phone]  Name: Gabriel [name] MRN: [id number] Date of Birth: [birth year]

## 2016-06-08 ENCOUNTER — Ambulatory Visit: Payer: Medicare Other | Admitting: Physical Therapy

## 2016-06-09 ENCOUNTER — Other Ambulatory Visit: Payer: Self-pay | Admitting: Family Medicine

## 2016-06-09 NOTE — Telephone Encounter (Signed)
Last CPX 05/26/16. Refills sent.

## 2016-06-15 ENCOUNTER — Telehealth: Payer: Self-pay

## 2016-06-15 ENCOUNTER — Ambulatory Visit: Payer: Medicare Other | Attending: Orthopedic Surgery

## 2016-06-15 DIAGNOSIS — R2689 Other abnormalities of gait and mobility: Secondary | ICD-10-CM | POA: Diagnosis not present

## 2016-06-15 DIAGNOSIS — M25661 Stiffness of right knee, not elsewhere classified: Secondary | ICD-10-CM | POA: Diagnosis not present

## 2016-06-15 DIAGNOSIS — G8929 Other chronic pain: Secondary | ICD-10-CM | POA: Insufficient documentation

## 2016-06-15 DIAGNOSIS — M25561 Pain in right knee: Secondary | ICD-10-CM | POA: Insufficient documentation

## 2016-06-15 MED ORDER — ZOSTER VAC RECOMB ADJUVANTED 50 MCG/0.5ML IM SUSR: 0.5000 mL | Freq: Once | INTRAMUSCULAR | 1 refills | Status: AC

## 2016-06-15 NOTE — Therapy (Signed)
[place] [facility] [address], [place], [address] Phone: [phone]   Fax:  [phone]  Physical Therapy Treatment  Patient Details  Name: Gabriel [name] MRN: [id number] Date of Birth: [birth year] No Data Recorded  Encounter Date: 06/15/2016      PT End of Session - 06/15/16 0932    Visit Number 14   Number of Visits 16   Date for PT Re-Evaluation 06/29/16   Authorization Type G Code 4/10   PT Start Time 0900   PT Stop Time 0945   PT Time Calculation (min) 45 min   Activity Tolerance Patient tolerated treatment well;Patient limited by fatigue;Patient limited by pain   Behavior During Therapy [facility] for tasks assessed/performed      Past Medical History:  Diagnosis Date  . BPH (benign prostatic hypertrophy)    h/o prostate nodule, followed by Dr. Risa Grill yearly  . Cervical neck pain with evidence of disc disease    h/o bulging discs  . Chronic lower back pain    DDD, prior on lyrica, gabapentin, lumbar stenosis with persistent numbness R ant thigh, ESI didn't help, now seeing [facility]  . Depression with anxiety    h/o remote hospitalization for depression  . GERD (gastroesophageal reflux disease)   . Hematuria - cause not known    s/p urology workup 2010, nl CT, cystoscopy (doesn't want rpt), cytology  . History of chicken pox   . History of colon polyps 2005   tubular adenoma, on rpt no more (2010)  . Insomnia    Dohmeier  . OSA on CPAP    cpap machine at 10cm, ambien, daytime drowsiness attributed to OSA, last changed dextroamphetamine to nuvigil, previously tried provigil  . Prostate nodule    followed by Risa Grill, reassuring exam and PSA  . Seronegative arthritis    RA, previously on MTX, RF <7, ANA nl (rheum Dr. Donnamarie Poag)  . Torn rotator cuff    bilateral, s/p L repair and now recurrent, chronic massive R RTC tear (supraspinatus)    Past Surgical History:  Procedure Laterality Date  . ACHILLES  TENDON REPAIR  2002  . CATARACT EXTRACTION  2007   left  . COLONOSCOPY  11/2008   diverticulosis, rpt 5 yrs  . COLONOSCOPY  07/2014   2 TA, mild diverticulosis, rpt 5 yrs [name])  . DOBUTAMINE STRESS ECHO  08/2006   negative for ischemia  . LUMBAR DISC SURGERY  07/2010   L3/4 diskectomy (Dr. Harl Bowie at [facility])  . MRI thoracic  09/2010   thoracic DDD/kyphosis, minimal anterolisthesis T2/3 with B foraminal narrowing  . ROTATOR CUFF REPAIR  2008   Left (Dr. Gaylyn Rong in [place], now [place])  . UPPER GASTROINTESTINAL ENDOSCOPY  02/2005   minimal GERD, small gastric polyp, tiny HH    There were no vitals filed for this visit.      Subjective Assessment - 06/15/16 0920    Subjective Patient reports he's had decreased pain over the past two weeks. Patient reports he would like to get stronger with performing sit to stands   Pertinent History sleep apnea; previous achilles tendon repair on the L LE,    Limitations Standing;Walking   How long can you stand comfortably? 25 mins   How long can you walk comfortably? 15 mins   Patient Stated Goals walking longer and standing without pain.   Currently in Pain? No/denies   Pain Onset More than a month ago  TREATMENT: Therapeutic exercises: Ambulation on treadmill at 1.5 mph - 4 min with focus on improving heel strike Leg Press on the R leg only at [facility] -45# -- x 20, #55 x20 Hip Machine with abduction - 40# x20;  Running man with stance foot on airex pad - 2 x 20 Skiing Fitter machine for hip strength - x 20  Sit to stands with use of TRX straps for speed - 2 x 20 with focus on performing greater hip flexion into motion Single leg stance with squat and use of TRX strap to perform - 2 x 10 on R LE only        PT Education - 06/15/16 0931    Education provided Yes   Education Details Form/technique with exercise   Person(s) Educated Patient   Methods Explanation;Demonstration   Comprehension Verbalized understanding;Returned  demonstration             PT Long Term Goals - 06/01/16 1402      PT LONG TERM GOAL #1   Title Pt will be independent with HEP to continue with benefits of therapy after discharge from physical therapy.    Baseline Dependent with exercise form/technique; 05/26/2016: Moderate cueing for exercise performance   Time 4   Period Weeks   Status On-going     PT LONG TERM GOAL #2   Title Pt will score 60/80 on the LEFS to demonstrate significant improvement in LE function and greater ability to walk without feeling fatigue.    Baseline 41/80; 05/26/2016: LEFS: 53/80   Time 4   Period Weeks   Status On-going     PT LONG TERM GOAL #3   Title Patient will score a 0/10 on the VAS as the worst pain in the past week to demonstrate improvement in knee function and pain.    Baseline 7/10 05/26/2016: 3/10   Time 4   Period Weeks   Status On-going     PT LONG TERM GOAL #4   Title Patient will score >1.44m/s on 76m walk test to demonstrate signifcant improvement in  gait speed and decrease in fall risk.    Baseline .60m/s 05/26/2016: .78m/s   Time 4   Period Weeks   Status On-going               Plan - 06/15/16 0957    Clinical Impression Statement Patient demonstrates decreased motor control with exercise requiring tactile and verbal cueing to perform motions with proper technique and form. Patient demonstrates decreased quadrieps and leg strength demonstrating increased pain with prolonged walking. Initiated a walking program with the patient to build LE endurance and improvement of motor control. Patient will benefit from further skilled therapy to return to prior level of function.     Rehab Potential Good   Clinical Impairments Affecting Rehab Potential (+) highly motivated, family support; (-) previous achilles tear, chronic knee pain   PT Frequency 1x / week   PT Duration 4 weeks   PT Treatment/Interventions Passive range of motion;Dry needling;Manual techniques;Balance  training;Therapeutic exercise;Therapeutic activities;Patient/family education;Stair training;Gait training;Neuromuscular re-education;Iontophoresis 4mg /ml Dexamethasone;Moist Heat;Ultrasound;Electrical Stimulation   PT Next Visit Plan Progress strengthening, decreasing extensor lag, and quad control   Consulted and Agree with Plan of Care Patient      Patient will benefit from skilled therapeutic intervention in order to improve the following deficits and impairments:  Abnormal gait, Increased fascial restricitons, Pain, Improper body mechanics, Decreased mobility, Increased muscle spasms, Decreased range of motion, Decreased endurance, Decreased strength, Decreased  activity tolerance, Decreased balance, Difficulty walking  Visit Diagnosis: Chronic pain of right knee  Stiffness of right knee, not elsewhere classified     Problem List Patient Active Problem List   Diagnosis Date Noted  . Advanced care planning/counseling discussion 05/30/2016  . Right wrist pain 05/30/2016  . Positive ANA (antinuclear antibody) 02/22/2016  . Right knee pain 11/28/2015  . External hemorrhoid 07/13/2015  . Hearing loss of right ear due to cerumen impaction 05/28/2015  . Obesity, Class I, BMI 30-34.9 05/28/2015  . Benign prostatic hyperplasia   . Medicare annual wellness visit, initial 05/13/2015  . Vitamin B12 deficiency 04/13/2013  . Fatigue 04/12/2013  . Left shoulder pain 09/10/2012  . DDD (degenerative disc disease), lumbar 12/06/2011  . Right cervical radiculopathy 08/31/2011  . Acquired deformity of nail 08/13/2011  . Healthcare maintenance 06/04/2011  . Insomnia 03/04/2011  . Right shoulder pain 01/06/2011  . Chronic lower back pain   . Hypersomnolence 12/02/2010  . Hematuria - cause not known   . MDD (major depressive disorder), recurrent episode, moderate ([place])   . GERD (gastroesophageal reflux disease)   . OSA (obstructive sleep apnea)   . Torn rotator cuff   . Colon polyp      Blythe Stanford, PT DPT 06/15/2016, 10:03 AM  [place] [facility] [address] [place], [address] Phone: [phone]   Fax:  [phone]  Name: Gabriel [name] MRN: [id number] Date of Birth: Mar 01, 1945

## 2016-06-15 NOTE — Telephone Encounter (Signed)
Pt left v/m; pt saw Dr Darnell Level recently and wants to verify that Dr Darnell Level wants pt to have new shingles shot; pt stopped at [place] and was advised covered at 100%. Pt would need rx sent for shingles vaccine. Pt request cb.

## 2016-06-15 NOTE — Telephone Encounter (Signed)
Yes - ok to take I've sent to [facility]. Remind it's a 2 shot series - to return to [facility] 2 mo after first shot.

## 2016-06-16 NOTE — Telephone Encounter (Signed)
Left message for Gabriel [name] that it is ok to take the Shingrix vaccine.  Dr. Danise Mina has sent over a prescription to  [facility]. Reminded him that  it's a 2 shot series - to return to [facility] 2 mo after first shot.

## 2016-06-22 ENCOUNTER — Ambulatory Visit: Payer: Medicare Other

## 2016-06-22 DIAGNOSIS — G8929 Other chronic pain: Secondary | ICD-10-CM | POA: Diagnosis not present

## 2016-06-22 DIAGNOSIS — M25561 Pain in right knee: Principal | ICD-10-CM

## 2016-06-22 DIAGNOSIS — M25661 Stiffness of right knee, not elsewhere classified: Secondary | ICD-10-CM | POA: Diagnosis not present

## 2016-06-22 DIAGNOSIS — R2689 Other abnormalities of gait and mobility: Secondary | ICD-10-CM | POA: Diagnosis not present

## 2016-06-22 NOTE — Therapy (Signed)
[place] [facility] [address], [place], [address] Phone: [phone]   Fax:  [phone]  Physical Therapy Treatment  Patient Details  Name: Gabriel [name] MRN: [id number] Date of Birth: 14-Nov-1945 No Data Recorded  Encounter Date: 06/22/2016      PT End of Session - 06/22/16 1533    Visit Number 15   Number of Visits 16   Date for PT Re-Evaluation 06/29/16   Authorization Type G Code 5/10   PT Start Time 0177   PT Stop Time 1600   PT Time Calculation (min) 45 min   Activity Tolerance Patient tolerated treatment well;Patient limited by fatigue;Patient limited by pain   Behavior During Therapy [facility] for tasks assessed/performed      Past Medical History:  Diagnosis Date  . BPH (benign prostatic hypertrophy)    h/o prostate nodule, followed by Dr. Risa Grill yearly  . Cervical neck pain with evidence of disc disease    h/o bulging discs  . Chronic lower back pain    DDD, prior on lyrica, gabapentin, lumbar stenosis with persistent numbness R ant thigh, ESI didn't help, now seeing Branch  . Depression with anxiety    h/o remote hospitalization for depression  . GERD (gastroesophageal reflux disease)   . Hematuria - cause not known    s/p urology workup 2010, nl CT, cystoscopy (doesn't want rpt), cytology  . History of chicken pox   . History of colon polyps 2005   tubular adenoma, on rpt no more (2010)  . Insomnia    Dohmeier  . OSA on CPAP    cpap machine at 10cm, ambien, daytime drowsiness attributed to OSA, last changed dextroamphetamine to nuvigil, previously tried provigil  . Prostate nodule    followed by Risa Grill, reassuring exam and PSA  . Seronegative arthritis    RA, previously on MTX, RF <7, ANA nl (rheum Dr. Donnamarie Poag)  . Torn rotator cuff    bilateral, s/p L repair and now recurrent, chronic massive R RTC tear (supraspinatus)    Past Surgical History:  Procedure Laterality Date  . ACHILLES  TENDON REPAIR  2002  . CATARACT EXTRACTION  2007   left  . COLONOSCOPY  11/2008   diverticulosis, rpt 5 yrs  . COLONOSCOPY  07/2014   2 TA, mild diverticulosis, rpt 5 yrs [name])  . DOBUTAMINE STRESS ECHO  08/2006   negative for ischemia  . LUMBAR DISC SURGERY  07/2010   L3/4 diskectomy ([facility]. Harl [facility] at [facility])  . MRI thoracic  09/2010   thoracic DDD/kyphosis, minimal anterolisthesis T2/3 with B foraminal narrowing  . ROTATOR CUFF REPAIR  2008   Left (Dr. Gaylyn Rong in [place], now [place])  . UPPER GASTROINTESTINAL ENDOSCOPY  02/2005   minimal GERD, small gastric polyp, tiny HH    There were no vitals filed for this visit.      Subjective Assessment - 06/22/16 1528    Subjective Patient reports no major changes since the previous visit. Patient reports he would like to continue to improve his transerring from sitting the standing.   Pertinent History sleep apnea; previous achilles tendon repair on the L LE,    Limitations Standing;Walking   How long can you stand comfortably? 25 mins   How long can you walk comfortably? 15 mins   Patient Stated Goals walking longer and standing without pain.   Currently in Pain? No/denies   Pain Onset More than a month ago  TREATMENT: Therapeutic exercises: Nustep with focus on improving speed and movement - x59min level 5  Leg Press on the R leg only at [facility] -45# -- x 20, #55 x20; #65 x20 Hip Machine with abduction - 40# 2x20 on R ;  55# 2 x 20 on L Single leg stance with squat and use of TRX strap to perform - 2 x 20 on R LE only Step ups onto 6" step - x30 on R LE only (high knee on left following)  Total gym level 23 - x20 with B LE squats  Squats with UE support and cueing to improve glute activation - x 10 added to HEP   Patient demonstrates increased fatigue by end of therapy       PT Education - 06/22/16 1532    Education provided Yes   Education Details form/technique with exercise   Person(s) Educated Patient   Methods  Explanation;Demonstration   Comprehension Verbalized understanding;Returned demonstration             PT Long Term Goals - 06/01/16 1402      PT LONG TERM GOAL #1   Title Pt will be independent with HEP to continue with benefits of therapy after discharge from physical therapy.    Baseline Dependent with exercise form/technique; 05/26/2016: Moderate cueing for exercise performance   Time 4   Period Weeks   Status On-going     PT LONG TERM GOAL #2   Title Pt will score 60/80 on the LEFS to demonstrate significant improvement in LE function and greater ability to walk without feeling fatigue.    Baseline 41/80; 05/26/2016: LEFS: 53/80   Time 4   Period Weeks   Status On-going     PT LONG TERM GOAL #3   Title Patient will score a 0/10 on the VAS as the worst pain in the past week to demonstrate improvement in knee function and pain.    Baseline 7/10 05/26/2016: 3/10   Time 4   Period Weeks   Status On-going     PT LONG TERM GOAL #4   Title Patient will score >1.44m/s on 59m walk test to demonstrate signifcant improvement in  gait speed and decrease in fall risk.    Baseline .65m/s 05/26/2016: .70m/s   Time 4   Period Weeks   Status On-going               Plan - 06/22/16 1536    Clinical Impression Statement Patient demonstrates decreased motor control with movement and requires frequent cueing on appropriate knee positioning throughout performance of exercises. Patient demonstrates improvement in Knee strength with ability to perform greater resistance on the leg press machine and patient will benefit from further skilled therapy focused on improving coordination and will benefit from further skilled therapy to return to prior level of function.    Rehab Potential Good   Clinical Impairments Affecting Rehab Potential (+) highly motivated, family support; (-) previous achilles tear, chronic knee pain   PT Frequency 1x / week   PT Duration 4 weeks   PT  Treatment/Interventions Passive range of motion;Dry needling;Manual techniques;Balance training;Therapeutic exercise;Therapeutic activities;Patient/family education;Stair training;Gait training;Neuromuscular re-education;Iontophoresis 4mg /ml Dexamethasone;Moist Heat;Ultrasound;Electrical Stimulation   PT Next Visit Plan Progress strengthening, decreasing extensor lag, and quad control   Consulted and Agree with Plan of Care Patient      Patient will benefit from skilled therapeutic intervention in order to improve the following deficits and impairments:  Abnormal gait, Increased fascial restricitons, Pain, Improper body  mechanics, Decreased mobility, Increased muscle spasms, Decreased range of motion, Decreased endurance, Decreased strength, Decreased activity tolerance, Decreased balance, Difficulty walking  Visit Diagnosis: Chronic pain of right knee  Stiffness of right knee, not elsewhere classified     Problem List Patient Active Problem List   Diagnosis Date Noted  . Advanced care planning/counseling discussion 05/30/2016  . Right wrist pain 05/30/2016  . Positive ANA (antinuclear antibody) 02/22/2016  . Right knee pain 11/28/2015  . External hemorrhoid 07/13/2015  . Hearing loss of right ear due to cerumen impaction 05/28/2015  . Obesity, Class I, BMI 30-34.9 05/28/2015  . Benign prostatic hyperplasia   . Medicare annual wellness visit, initial 05/13/2015  . Vitamin B12 deficiency 04/13/2013  . Fatigue 04/12/2013  . Left shoulder pain 09/10/2012  . DDD (degenerative disc disease), lumbar 12/06/2011  . Right cervical radiculopathy 08/31/2011  . Acquired deformity of nail 08/13/2011  . Healthcare maintenance 06/04/2011  . Insomnia 03/04/2011  . Right shoulder pain 01/06/2011  . Chronic lower back pain   . Hypersomnolence 12/02/2010  . Hematuria - cause not known   . MDD (major depressive disorder), recurrent episode, moderate ([place])   . GERD (gastroesophageal reflux  disease)   . OSA (obstructive sleep apnea)   . Torn rotator cuff   . Colon polyp     Blythe Stanford, PT DPT 06/22/2016, 4:06 PM  [place] [facility] [address], [place], [address] Phone: [phone]   Fax:  [phone]  Name: Gabriel [name] MRN: [id number] Date of Birth: 1945/11/07

## 2016-06-24 ENCOUNTER — Ambulatory Visit: Payer: Medicare Other

## 2016-06-27 DIAGNOSIS — S83241D Other tear of medial meniscus, current injury, right knee, subsequent encounter: Secondary | ICD-10-CM | POA: Diagnosis not present

## 2016-06-28 ENCOUNTER — Ambulatory Visit: Payer: Medicare Other

## 2016-06-28 DIAGNOSIS — R2689 Other abnormalities of gait and mobility: Secondary | ICD-10-CM | POA: Diagnosis not present

## 2016-06-28 DIAGNOSIS — M25661 Stiffness of right knee, not elsewhere classified: Secondary | ICD-10-CM | POA: Diagnosis not present

## 2016-06-28 DIAGNOSIS — G8929 Other chronic pain: Secondary | ICD-10-CM

## 2016-06-28 DIAGNOSIS — M25561 Pain in right knee: Principal | ICD-10-CM

## 2016-06-28 NOTE — Therapy (Signed)
[place] [facility] [address] [place], [place], [address] Phone: [phone]   Fax:  [phone]  Physical Therapy Treatment  Patient Details  Name: Gabriel [name] MRN: [id number] Date of Birth: October 08, 1945 No Data Recorded  Encounter Date: 06/28/2016      PT End of Session - 06/28/16 1250    Visit Number 16   Number of Visits 16   Date for PT Re-Evaluation 06/29/16   Authorization Type G Code 6/10   PT Start Time 1115   PT Stop Time 1200   PT Time Calculation (min) 45 min   Activity Tolerance Patient tolerated treatment well;Patient limited by fatigue;Patient limited by pain   Behavior During Therapy [facility] for tasks assessed/performed      Past Medical History:  Diagnosis Date  . BPH (benign prostatic hypertrophy)    h/o prostate nodule, followed by Dr. Risa Grill yearly  . Cervical neck pain with evidence of disc disease    h/o bulging discs  . Chronic lower back pain    DDD, prior on lyrica, gabapentin, lumbar stenosis with persistent numbness R ant thigh, ESI didn't help, now seeing Branch  . Depression with anxiety    h/o remote hospitalization for depression  . GERD (gastroesophageal reflux disease)   . Hematuria - cause not known    s/p urology workup 2010, nl CT, cystoscopy (doesn't want rpt), cytology  . History of chicken pox   . History of colon polyps 2005   tubular adenoma, on rpt no more (2010)  . Insomnia    Dohmeier  . OSA on CPAP    cpap machine at 10cm, ambien, daytime drowsiness attributed to OSA, last changed dextroamphetamine to nuvigil, previously tried provigil  . Prostate nodule    followed by Risa Grill, reassuring exam and PSA  . Seronegative arthritis    RA, previously on MTX, RF <7, ANA nl (rheum Dr. Donnamarie Poag)  . Torn rotator cuff    bilateral, s/p L repair and now recurrent, chronic massive R RTC tear (supraspinatus)    Past Surgical History:  Procedure Laterality Date  . ACHILLES TENDON  REPAIR  2002  . CATARACT EXTRACTION  2007   left  . COLONOSCOPY  11/2008   diverticulosis, rpt 5 yrs  . COLONOSCOPY  07/2014   2 TA, mild diverticulosis, rpt 5 yrs [name])  . DOBUTAMINE STRESS ECHO  08/2006   negative for ischemia  . LUMBAR DISC SURGERY  07/2010   L3/4 diskectomy ([facility] Harl [facility])  . MRI thoracic  09/2010   thoracic DDD/kyphosis, minimal anterolisthesis T2/3 with B foraminal narrowing  . ROTATOR CUFF REPAIR  2008   Left (Dr. Gaylyn Rong in [place], now [place])  . UPPER GASTROINTESTINAL ENDOSCOPY  02/2005   minimal GERD, small gastric polyp, tiny HH    There were no vitals filed for this visit.      Subjective Assessment - 06/28/16 1140    Subjective Patient reports no major changes since the previous visit. Patient reports he went to the see his MD and the PA said to not bend his knee past 90deg.    Pertinent History sleep apnea; previous achilles tendon repair on the L LE,    Limitations Standing;Walking   How long can you stand comfortably? 25 mins   How long can you walk comfortably? 15 mins   Patient Stated Goals walking longer and standing without pain.   Currently in Pain? No/denies   Pain Onset More than a month ago  TREATMENT: Therapeutic exercises: Nustep with focus on improving speed and movement - x28min level 2; x86min level 3  Leg Press on the R leg only at [facility] - 90# x15; 105# x15 Sit to stands - x20, x 10  without UE support  Heel walking down  bars with UE support - x down/back 10  Side Stepping up and over bosu ball - x8  Marches on Bosu ball with UE support - x20     Patient demonstrates increased fatigue by end of therapy      PT Education - 06/28/16 1249    Education provided Yes   Education Details form/technique with exercise   Person(s) Educated Patient   Methods Explanation;Demonstration   Comprehension Verbalized understanding;Returned demonstration             PT Long Term Goals - 06/01/16 1402      PT LONG  TERM GOAL #1   Title Pt will be independent with HEP to continue with benefits of therapy after discharge from physical therapy.    Baseline Dependent with exercise form/technique; 05/26/2016: Moderate cueing for exercise performance   Time 4   Period Weeks   Status On-going     PT LONG TERM GOAL #2   Title Pt will score 60/80 on the LEFS to demonstrate significant improvement in LE function and greater ability to walk without feeling fatigue.    Baseline 41/80; 05/26/2016: LEFS: 53/80   Time 4   Period Weeks   Status On-going     PT LONG TERM GOAL #3   Title Patient will score a 0/10 on the VAS as the worst pain in the past week to demonstrate improvement in knee function and pain.    Baseline 7/10 05/26/2016: 3/10   Time 4   Period Weeks   Status On-going     PT LONG TERM GOAL #4   Title Patient will score >1.63m/s on 80m walk test to demonstrate signifcant improvement in  gait speed and decrease in fall risk.    Baseline .3m/s 05/26/2016: .39m/s   Time 4   Period Weeks   Status On-going               Plan - 06/28/16 1250    Clinical Impression Statement Patient demonstrates improvement in quadriceps strength and recruitment after performing frequent repetitions indicating poor motor control and coordination. Patient will benefit from further skilled therapy focused on improving strength and activation to return to prior level of function.    Rehab Potential Good   Clinical Impairments Affecting Rehab Potential (+) highly motivated, family support; (-) previous achilles tear, chronic knee pain   PT Frequency 1x / week   PT Duration 4 weeks   PT Treatment/Interventions Passive range of motion;Dry needling;Manual techniques;Balance training;Therapeutic exercise;Therapeutic activities;Patient/family education;Stair training;Gait training;Neuromuscular re-education;Iontophoresis 4mg /ml Dexamethasone;Moist Heat;Ultrasound;Electrical Stimulation   PT Next Visit Plan Progress  strengthening, decreasing extensor lag, and quad control   Consulted and Agree with Plan of Care Patient      Patient will benefit from skilled therapeutic intervention in order to improve the following deficits and impairments:  Abnormal gait, Increased fascial restricitons, Pain, Improper body mechanics, Decreased mobility, Increased muscle spasms, Decreased range of motion, Decreased endurance, Decreased strength, Decreased activity tolerance, Decreased balance, Difficulty walking  Visit Diagnosis: Chronic pain of right knee  Stiffness of right knee, not elsewhere classified  Other abnormalities of gait and mobility     Problem List Patient Active Problem List   Diagnosis Date Noted  .  Advanced care planning/counseling discussion 05/30/2016  . Right wrist pain 05/30/2016  . Positive ANA (antinuclear antibody) 02/22/2016  . Right knee pain 11/28/2015  . External hemorrhoid 07/13/2015  . Hearing loss of right ear due to cerumen impaction 05/28/2015  . Obesity, Class I, BMI 30-34.9 05/28/2015  . Benign prostatic hyperplasia   . Medicare annual wellness visit, initial 05/13/2015  . Vitamin B12 deficiency 04/13/2013  . Fatigue 04/12/2013  . Left shoulder pain 09/10/2012  . DDD (degenerative disc disease), lumbar 12/06/2011  . Right cervical radiculopathy 08/31/2011  . Acquired deformity of nail 08/13/2011  . Healthcare maintenance 06/04/2011  . Insomnia 03/04/2011  . Right shoulder pain 01/06/2011  . Chronic lower back pain   . Hypersomnolence 12/02/2010  . Hematuria - cause not known   . MDD (major depressive disorder), recurrent episode, moderate ([place])   . GERD (gastroesophageal reflux disease)   . OSA (obstructive sleep apnea)   . Torn rotator cuff   . Colon polyp     Blythe Stanford, PT DPT 06/28/2016, 12:55 PM  [place] [facility] [address] [place], [place], [address] Phone: [phone]   Fax:   [phone]  Name: Gabriel [name] MRN: [id number] Date of Birth: October 11, 1945

## 2016-06-30 ENCOUNTER — Ambulatory Visit: Payer: Medicare Other

## 2016-06-30 DIAGNOSIS — M25661 Stiffness of right knee, not elsewhere classified: Secondary | ICD-10-CM

## 2016-06-30 DIAGNOSIS — G8929 Other chronic pain: Secondary | ICD-10-CM

## 2016-06-30 DIAGNOSIS — M25561 Pain in right knee: Secondary | ICD-10-CM | POA: Diagnosis not present

## 2016-06-30 DIAGNOSIS — R2689 Other abnormalities of gait and mobility: Secondary | ICD-10-CM | POA: Diagnosis not present

## 2016-06-30 NOTE — Therapy (Signed)
[place] [facility] [address], [place], [address] Phone: [phone]   Fax:  [phone]  Physical Therapy Treatment  Patient Details  Name: LYLE NIBLETT MRN: [id number] Date of Birth: 06/20/1945 No Data Recorded  Encounter Date: 06/30/2016      PT End of Session - 06/30/16 1039    Visit Number 17   Number of Visits 24   Date for PT Re-Evaluation 07/28/16   Authorization Type G Code 7/10   PT Start Time 1030   PT Stop Time 1115   PT Time Calculation (min) 45 min   Activity Tolerance Patient tolerated treatment well;Patient limited by fatigue;Patient limited by pain   Behavior During Therapy [facility] for tasks assessed/performed      Past Medical History:  Diagnosis Date  . BPH (benign prostatic hypertrophy)    h/o prostate nodule, followed by Dr. Risa Grill yearly  . Cervical neck pain with evidence of disc disease    h/o bulging discs  . Chronic lower back pain    DDD, prior on lyrica, gabapentin, lumbar stenosis with persistent numbness R ant thigh, ESI didn't help, now seeing [facility]  . Depression with anxiety    h/o remote hospitalization for depression  . GERD (gastroesophageal reflux disease)   . Hematuria - cause not known    s/p urology workup 2010, nl CT, cystoscopy (doesn't want rpt), cytology  . History of chicken pox   . History of colon polyps 2005   tubular adenoma, on rpt no more (2010)  . Insomnia    Dohmeier  . OSA on CPAP    cpap machine at 10cm, ambien, daytime drowsiness attributed to OSA, last changed dextroamphetamine to nuvigil, previously tried provigil  . Prostate nodule    followed by Risa Grill, reassuring exam and PSA  . Seronegative arthritis    RA, previously on MTX, RF <7, ANA nl (rheum Dr. Donnamarie Poag)  . Torn rotator cuff    bilateral, s/p L repair and now recurrent, chronic massive R RTC tear (supraspinatus)    Past Surgical History:  Procedure Laterality Date  . ACHILLES  TENDON REPAIR  2002  . CATARACT EXTRACTION  2007   left  . COLONOSCOPY  11/2008   diverticulosis, rpt 5 yrs  . COLONOSCOPY  07/2014   2 TA, mild diverticulosis, rpt 5 yrs [name])  . DOBUTAMINE STRESS ECHO  08/2006   negative for ischemia  . LUMBAR DISC SURGERY  07/2010   L3/4 diskectomy ([facility]. Harl [facility]ie at [facility])  . MRI thoracic  09/2010   thoracic DDD/kyphosis, minimal anterolisthesis T2/3 with B foraminal narrowing  . ROTATOR CUFF REPAIR  2008   Left (Dr. Gaylyn Rong in [place], now [place])  . UPPER GASTROINTESTINAL ENDOSCOPY  02/2005   minimal GERD, small gastric polyp, tiny HH    There were no vitals filed for this visit.      Subjective Assessment - 06/30/16 1035    Subjective Patient reports no major changes and reports he is prepared for therapy today. Patient reports no increase in pain.    Pertinent History sleep apnea; previous achilles tendon repair on the L LE,    Limitations Standing;Walking   How long can you stand comfortably? 25 mins   How long can you walk comfortably? 15 mins   Patient Stated Goals walking longer and standing without pain.   Currently in Pain? No/denies   Pain Onset More than a month ago        Therapeutic Exercise:  Single leg squats at total gym at level 20 - x20 with knee at 90 degrees at lowest position SLR with 7.5# for 69min with frequent activation of quadriceps every 12 sec for 4 sec hold with patient in long sitting Hip Machine 40# -- 2 x 20 with UE support  Ambulating with focus on heel strike - x 226ft Nustep at level 4 to stop increased pain in the knee - x23min   Modalities: With patient in long sitting and two large pads places over the quadriceps musculature. NMES 4 sec on/ 12 sec off with 2 sec ramp and an intensity of 15 mA for 51min with patient actively contracting during electrode activation Patient responds well to therapy demonstrating improvement in quad activation after NMES       PT Education - 06/30/16 1038    Education  provided Yes   Education Details form/technique with exercise    Person(s) Educated Patient   Methods Explanation;Demonstration   Comprehension Verbalized understanding;Returned demonstration             PT Long Term Goals - 06/01/16 1402      PT LONG TERM GOAL #1   Title Pt will be independent with HEP to continue with benefits of therapy after discharge from physical therapy.    Baseline Dependent with exercise form/technique; 05/26/2016: Moderate cueing for exercise performance   Time 4   Period Weeks   Status On-going     PT LONG TERM GOAL #2   Title Pt will score 60/80 on the LEFS to demonstrate significant improvement in LE function and greater ability to walk without feeling fatigue.    Baseline 41/80; 05/26/2016: LEFS: 53/80   Time 4   Period Weeks   Status On-going     PT LONG TERM GOAL #3   Title Patient will score a 0/10 on the VAS as the worst pain in the past week to demonstrate improvement in knee function and pain.    Baseline 7/10 05/26/2016: 3/10   Time 4   Period Weeks   Status On-going     PT LONG TERM GOAL #4   Title Patient will score >1.64m/s on 50m walk test to demonstrate signifcant improvement in  gait speed and decrease in fall risk.    Baseline .44m/s 05/26/2016: .13m/s   Time 4   Period Weeks   Status On-going               Plan - 06/30/16 1109    Clinical Impression Statement Focused on improving quadriceps control with exercise. Patient demonstrates improvement on tactile quadriceps activation after performing modlaties NMES waveform indicating improvement in coordination and control. Patient will benefit from further skilled therapy focused on improving quadriceps activation and strength to return to prior level of function.    Rehab Potential Good   Clinical Impairments Affecting Rehab Potential (+) highly motivated, family support; (-) previous achilles tear, chronic knee pain   PT Frequency 1x / week   PT Duration 4 weeks   PT  Treatment/Interventions Passive range of motion;Dry needling;Manual techniques;Balance training;Therapeutic exercise;Therapeutic activities;Patient/family education;Stair training;Gait training;Neuromuscular re-education;Iontophoresis 4mg /ml Dexamethasone;Moist Heat;Ultrasound;Electrical Stimulation   PT Next Visit Plan Progress strengthening, decreasing extensor lag, and quad control   Consulted and Agree with Plan of Care Patient      Patient will benefit from skilled therapeutic intervention in order to improve the following deficits and impairments:  Abnormal gait, Increased fascial restricitons, Pain, Improper body mechanics, Decreased mobility, Increased muscle spasms, Decreased range of motion,  Decreased endurance, Decreased strength, Decreased activity tolerance, Decreased balance, Difficulty walking  Visit Diagnosis: Chronic pain of right knee  Stiffness of right knee, not elsewhere classified     Problem List Patient Active Problem List   Diagnosis Date Noted  . Advanced care planning/counseling discussion 05/30/2016  . Right wrist pain 05/30/2016  . Positive ANA (antinuclear antibody) 02/22/2016  . Right knee pain 11/28/2015  . External hemorrhoid 07/13/2015  . Hearing loss of right ear due to cerumen impaction 05/28/2015  . Obesity, Class I, BMI 30-34.9 05/28/2015  . Benign prostatic hyperplasia   . Medicare annual wellness visit, initial 05/13/2015  . Vitamin B12 deficiency 04/13/2013  . Fatigue 04/12/2013  . Left shoulder pain 09/10/2012  . DDD (degenerative disc disease), lumbar 12/06/2011  . Right cervical radiculopathy 08/31/2011  . Acquired deformity of nail 08/13/2011  . Healthcare maintenance 06/04/2011  . Insomnia 03/04/2011  . Right shoulder pain 01/06/2011  . Chronic lower back pain   . Hypersomnolence 12/02/2010  . Hematuria - cause not known   . MDD (major depressive disorder), recurrent episode, moderate ([place])   . GERD (gastroesophageal reflux  disease)   . OSA (obstructive sleep apnea)   . Torn rotator cuff   . Colon polyp     Blythe Stanford, PT DPT 06/30/2016, 11:34 AM  [place] [facility] [address] [place], [address] Phone: [phone]   Fax:  [phone]  Name: BIJAN RIDGLEY MRN: [id number] Date of Birth: 1945/09/22

## 2016-07-05 ENCOUNTER — Ambulatory Visit: Payer: Medicare Other

## 2016-07-05 DIAGNOSIS — R2689 Other abnormalities of gait and mobility: Secondary | ICD-10-CM | POA: Diagnosis not present

## 2016-07-05 DIAGNOSIS — M25561 Pain in right knee: Principal | ICD-10-CM

## 2016-07-05 DIAGNOSIS — G8929 Other chronic pain: Secondary | ICD-10-CM | POA: Diagnosis not present

## 2016-07-05 DIAGNOSIS — M25661 Stiffness of right knee, not elsewhere classified: Secondary | ICD-10-CM

## 2016-07-06 NOTE — Therapy (Signed)
[place] [facility] [address], [place], [address] Phone: [phone]   Fax:  [phone]  Physical Therapy Treatment  Patient Details  Name: Gabriel [name] MRN: [id number] Date of Birth: 02-24-1945 No Data Recorded  Encounter Date: 07/05/2016      PT End of Session - 07/05/16 1810    Visit Number 18   Number of Visits 24   Date for PT Re-Evaluation 07/28/16   Authorization Type G Code 8/10   PT Start Time 1660   PT Stop Time 1815   PT Time Calculation (min) 30 min   Activity Tolerance Patient tolerated treatment well;Patient limited by fatigue;Patient limited by pain   Behavior During Therapy [facility] for tasks assessed/performed      Past Medical History:  Diagnosis Date  . BPH (benign prostatic hypertrophy)    h/o prostate nodule, followed by Dr. Risa Grill yearly  . Cervical neck pain with evidence of disc disease    h/o bulging discs  . Chronic lower back pain    DDD, prior on lyrica, gabapentin, lumbar stenosis with persistent numbness R ant thigh, ESI didn't help, now seeing [facility]  . Depression with anxiety    h/o remote hospitalization for depression  . GERD (gastroesophageal reflux disease)   . Hematuria - cause not known    s/p urology workup 2010, nl CT, cystoscopy (doesn't want rpt), cytology  . History of chicken pox   . History of colon polyps 2005   tubular adenoma, on rpt no more (2010)  . Insomnia    Dohmeier  . OSA on CPAP    cpap machine at 10cm, ambien, daytime drowsiness attributed to OSA, last changed dextroamphetamine to nuvigil, previously tried provigil  . Prostate nodule    followed by Risa Grill, reassuring exam and PSA  . Seronegative arthritis    RA, previously on MTX, RF <7, ANA nl (rheum Dr. Donnamarie Poag)  . Torn rotator cuff    bilateral, s/p L repair and now recurrent, chronic massive R RTC tear (supraspinatus)    Past Surgical History:  Procedure Laterality Date  . ACHILLES  TENDON REPAIR  2002  . CATARACT EXTRACTION  2007   left  . COLONOSCOPY  11/2008   diverticulosis, rpt 5 yrs  . COLONOSCOPY  07/2014   2 TA, mild diverticulosis, rpt 5 yrs [name])  . DOBUTAMINE STRESS ECHO  08/2006   negative for ischemia  . LUMBAR DISC SURGERY  07/2010   L3/4 diskectomy (Dr. Harl Bowie at [facility])  . MRI thoracic  09/2010   thoracic DDD/kyphosis, minimal anterolisthesis T2/3 with B foraminal narrowing  . ROTATOR CUFF REPAIR  2008   Left (Dr. Gaylyn Rong in [place], now [place])  . UPPER GASTROINTESTINAL ENDOSCOPY  02/2005   minimal GERD, small gastric polyp, tiny HH    There were no vitals filed for this visit.      Subjective Assessment - 07/05/16 1747    Subjective Patient reports no current pain in the knee. Patient reports he is excited for therapy today. Patient reports he is late secondary to traffic.    Pertinent History sleep apnea; previous achilles tendon repair on the L LE,    Limitations Standing;Walking   How long can you stand comfortably? 25 mins   How long can you walk comfortably? 15 mins   Patient Stated Goals walking longer and standing without pain.   Currently in Pain? No/denies   Pain Onset More than a month ago  Therapeutic Exercise: Leg squats at total gym at level 26 - x20 with knee at 90 degrees at lowest position Hip Machine 40# --  x 20 with UE support  Walking on heels - 2 x 1ft  Stepping with stance foot positioned on airex pad and stepping over foam - x 20 B Quadriceps Leg Press at [employer] [address] Heel taps from 6" step - x20  Heel/Toe taps with stance leg on airex beam - x20 B  Patient Demonstrates increased fatigue after session         PT Education - 07/05/16 1752    Education provided Yes   Education Details form/tecnique with exercise   Person(s) Educated Patient   Methods Explanation;Demonstration   Comprehension Verbalized understanding;Returned demonstration             PT Long Term Goals - 06/01/16 1402       PT LONG TERM GOAL #1   Title Pt will be independent with HEP to continue with benefits of therapy after discharge from physical therapy.    Baseline Dependent with exercise form/technique; 05/26/2016: Moderate cueing for exercise performance   Time 4   Period Weeks   Status On-going     PT LONG TERM GOAL #2   Title Pt will score 60/80 on the LEFS to demonstrate significant improvement in LE function and greater ability to walk without feeling fatigue.    Baseline 41/80; 05/26/2016: LEFS: 53/80   Time 4   Period Weeks   Status On-going     PT LONG TERM GOAL #3   Title Patient will score a 0/10 on the VAS as the worst pain in the past week to demonstrate improvement in knee function and pain.    Baseline 7/10 05/26/2016: 3/10   Time 4   Period Weeks   Status On-going     PT LONG TERM GOAL #4   Title Patient will score >1.57m/s on 55m walk test to demonstrate signifcant improvement in  gait speed and decrease in fall risk.    Baseline .48m/s 05/26/2016: .42m/s   Time 4   Period Weeks   Status On-going               Plan - 07/05/16 1819    Clinical Impression Statement Continued to focus on improving quadriceps coordination with exercise. Patient demonstrates increased fasiculations and fatigue at end of session indicating decreased muscular endurance and patient will benefit from further skilled therapy to return to prior level of function.    Rehab Potential Good   Clinical Impairments Affecting Rehab Potential (+) highly motivated, family support; (-) previous achilles tear, chronic knee pain   PT Frequency 1x / week   PT Duration 4 weeks   PT Treatment/Interventions Passive range of motion;Dry needling;Manual techniques;Balance training;Therapeutic exercise;Therapeutic activities;Patient/family education;Stair training;Gait training;Neuromuscular re-education;Iontophoresis 4mg /ml Dexamethasone;Moist Heat;Ultrasound;Electrical Stimulation   PT Next Visit Plan Progress  strengthening, decreasing extensor lag, and quad control   Consulted and Agree with Plan of Care Patient      Patient will benefit from skilled therapeutic intervention in order to improve the following deficits and impairments:  Abnormal gait, Increased fascial restricitons, Pain, Improper body mechanics, Decreased mobility, Increased muscle spasms, Decreased range of motion, Decreased endurance, Decreased strength, Decreased activity tolerance, Decreased balance, Difficulty walking  Visit Diagnosis: Chronic pain of right knee  Stiffness of right knee, not elsewhere classified  Other abnormalities of gait and mobility     Problem List Patient Active Problem List   Diagnosis  Date Noted  . Advanced care planning/counseling discussion 05/30/2016  . Right wrist pain 05/30/2016  . Positive ANA (antinuclear antibody) 02/22/2016  . Right knee pain 11/28/2015  . External hemorrhoid 07/13/2015  . Hearing loss of right ear due to cerumen impaction 05/28/2015  . Obesity, Class I, BMI 30-34.9 05/28/2015  . Benign prostatic hyperplasia   . Medicare annual wellness visit, initial 05/13/2015  . Vitamin B12 deficiency 04/13/2013  . Fatigue 04/12/2013  . Left shoulder pain 09/10/2012  . DDD (degenerative disc disease), lumbar 12/06/2011  . Right cervical radiculopathy 08/31/2011  . Acquired deformity of nail 08/13/2011  . Healthcare maintenance 06/04/2011  . Insomnia 03/04/2011  . Right shoulder pain 01/06/2011  . Chronic lower back pain   . Hypersomnolence 12/02/2010  . Hematuria - cause not known   . MDD (major depressive disorder), recurrent episode, moderate ([place])   . GERD (gastroesophageal reflux disease)   . OSA (obstructive sleep apnea)   . Torn rotator cuff   . Colon polyp     Blythe Stanford, PT DPT 07/06/2016, 8:43 AM  [place] [facility] [address] [place], [address] Phone: [phone]   Fax:   [phone]  Name: Gabriel [name] MRN: [id number] Date of Birth: Jan 22, 1946

## 2016-07-12 ENCOUNTER — Ambulatory Visit: Payer: Medicare Other | Attending: Orthopedic Surgery

## 2016-07-12 DIAGNOSIS — R2689 Other abnormalities of gait and mobility: Secondary | ICD-10-CM | POA: Diagnosis not present

## 2016-07-12 DIAGNOSIS — M25561 Pain in right knee: Secondary | ICD-10-CM | POA: Insufficient documentation

## 2016-07-12 DIAGNOSIS — M25661 Stiffness of right knee, not elsewhere classified: Secondary | ICD-10-CM | POA: Diagnosis not present

## 2016-07-12 DIAGNOSIS — G8929 Other chronic pain: Secondary | ICD-10-CM | POA: Insufficient documentation

## 2016-07-12 NOTE — Therapy (Signed)
[place] [facility] [address] [place], [address] Phone: [phone]   Fax:  [phone]  Physical Therapy Treatment  Patient Details  Name: Gabriel [name] MRN: [id number] Date of Birth: 06-07-1945 No Data Recorded  Encounter Date: 07/12/2016      PT End of Session - 07/12/16 1538    Visit Number 19   Number of Visits 24   Date for PT Re-Evaluation 07/28/16   Authorization Type G Code 9/10   PT Start Time 1532   PT Stop Time 7672   PT Time Calculation (min) 43 min   Activity Tolerance Patient tolerated treatment well;Patient limited by fatigue;Patient limited by pain   Behavior During Therapy [facility] for tasks assessed/performed      Past Medical History:  Diagnosis Date  . BPH (benign prostatic hypertrophy)    h/o prostate nodule, followed by Dr. Risa Grill yearly  . Cervical neck pain with evidence of disc disease    h/o bulging discs  . Chronic lower back pain    DDD, prior on lyrica, gabapentin, lumbar stenosis with persistent numbness R ant thigh, ESI didn't help, now seeing [facility]  . Depression with anxiety    h/o remote hospitalization for depression  . GERD (gastroesophageal reflux disease)   . Hematuria - cause not known    s/p urology workup 2010, nl CT, cystoscopy (doesn't want rpt), cytology  . History of chicken pox   . History of colon polyps 2005   tubular adenoma, on rpt no more (2010)  . Insomnia    Dohmeier  . OSA on CPAP    cpap machine at 10cm, ambien, daytime drowsiness attributed to OSA, last changed dextroamphetamine to nuvigil, previously tried provigil  . Prostate nodule    followed by Risa Grill, reassuring exam and PSA  . Seronegative arthritis    RA, previously on MTX, RF <7, ANA nl (rheum Dr. Donnamarie Poag)  . Torn rotator cuff    bilateral, s/p L repair and now recurrent, chronic massive R RTC tear (supraspinatus)    Past Surgical History:  Procedure Laterality Date  . ACHILLES TENDON  REPAIR  2002  . CATARACT EXTRACTION  2007   left  . COLONOSCOPY  11/2008   diverticulosis, rpt 5 yrs  . COLONOSCOPY  07/2014   2 TA, mild diverticulosis, rpt 5 yrs [name])  . DOBUTAMINE STRESS ECHO  08/2006   negative for ischemia  . LUMBAR DISC SURGERY  07/2010   L3/4 diskectomy ([facility]. Harl Bowie at [facility])  . MRI thoracic  09/2010   thoracic DDD/kyphosis, minimal anterolisthesis T2/3 with B foraminal narrowing  . ROTATOR CUFF REPAIR  2008   Left (Dr. Gaylyn Rong in [place], now [place])  . UPPER GASTROINTESTINAL ENDOSCOPY  02/2005   minimal GERD, small gastric polyp, tiny HH    There were no vitals filed for this visit.      Subjective Assessment - 07/12/16 1539    Subjective Patient reports he had 2 episodes of the knee buckling. Patient reports he is walking slower and would like to walk easier. Patient reports he would like improve his endurance.    Pertinent History sleep apnea; previous achilles tendon repair on the L LE,    Limitations Standing;Walking   How long can you stand comfortably? 25 mins   How long can you walk comfortably? 15 mins   Patient Stated Goals walking longer and standing without pain.   Currently in Pain? No/denies   Pain Onset More than a  month ago       TREATMENT  Nustep -- level 8; with 7 min with focus of improving speed Walking forward -- 2 x 655ft with 10# weight in R hand to improve weight shifting Walking backwards -- 43ft x 3 with 10# in the R hand to improve weight shifting Hip Machine -- 2 x 30 reps 25# Total Gym -- level: 26 x 43min to increase Quad endurance          PT Education - 07/12/16 1539    Education provided Yes   Education Details form/technique with exercise   Person(s) Educated Patient   Methods Explanation;Demonstration   Comprehension Verbalized understanding;Returned demonstration             PT Long Term Goals - 06/01/16 1402      PT LONG TERM GOAL #1   Title Pt will be independent with HEP to continue with  benefits of therapy after discharge from physical therapy.    Baseline Dependent with exercise form/technique; 05/26/2016: Moderate cueing for exercise performance   Time 4   Period Weeks   Status On-going     PT LONG TERM GOAL #2   Title Pt will score 60/80 on the LEFS to demonstrate significant improvement in LE function and greater ability to walk without feeling fatigue.    Baseline 41/80; 05/26/2016: LEFS: 53/80   Time 4   Period Weeks   Status On-going     PT LONG TERM GOAL #3   Title Patient will score a 0/10 on the VAS as the worst pain in the past week to demonstrate improvement in knee function and pain.    Baseline 7/10 05/26/2016: 3/10   Time 4   Period Weeks   Status On-going     PT LONG TERM GOAL #4   Title Patient will score >1.75m/s on 47m walk test to demonstrate signifcant improvement in  gait speed and decrease in fall risk.    Baseline .77m/s 05/26/2016: .67m/s   Time 4   Period Weeks   Status On-going               Plan - 07/12/16 1555    Clinical Impression Statement Focused on improving hip strengthening and quadriceps coordination. Patient demonstrates decreased weight bearing throuh his R LE when walking and standing and focused exercises on improving this limitation. Patient demonstrates improvement with exercises after cueing and demonstrates increased hip weakness. Patient will benefit from further skilled therapy focused on improving limitations to return to prior level of function.    Rehab Potential Good   Clinical Impairments Affecting Rehab Potential (+) highly motivated, family support; (-) previous achilles tear, chronic knee pain   PT Frequency 1x / week   PT Duration 4 weeks   PT Treatment/Interventions Passive range of motion;Dry needling;Manual techniques;Balance training;Therapeutic exercise;Therapeutic activities;Patient/family education;Stair training;Gait training;Neuromuscular re-education;Iontophoresis 4mg /ml Dexamethasone;Moist  Heat;Ultrasound;Electrical Stimulation   PT Next Visit Plan Progress strengthening, decreasing extensor lag, and quad control   Consulted and Agree with Plan of Care Patient      Patient will benefit from skilled therapeutic intervention in order to improve the following deficits and impairments:  Abnormal gait, Increased fascial restricitons, Pain, Improper body mechanics, Decreased mobility, Increased muscle spasms, Decreased range of motion, Decreased endurance, Decreased strength, Decreased activity tolerance, Decreased balance, Difficulty walking  Visit Diagnosis: Chronic pain of right knee  Stiffness of right knee, not elsewhere classified     Problem List Patient Active Problem List   Diagnosis Date Noted  .  Advanced care planning/counseling discussion 05/30/2016  . Right wrist pain 05/30/2016  . Positive ANA (antinuclear antibody) 02/22/2016  . Right knee pain 11/28/2015  . External hemorrhoid 07/13/2015  . Hearing loss of right ear due to cerumen impaction 05/28/2015  . Obesity, Class I, BMI 30-34.9 05/28/2015  . Benign prostatic hyperplasia   . Medicare annual wellness visit, initial 05/13/2015  . Vitamin B12 deficiency 04/13/2013  . Fatigue 04/12/2013  . Left shoulder pain 09/10/2012  . DDD (degenerative disc disease), lumbar 12/06/2011  . Right cervical radiculopathy 08/31/2011  . Acquired deformity of nail 08/13/2011  . Healthcare maintenance 06/04/2011  . Insomnia 03/04/2011  . Right shoulder pain 01/06/2011  . Chronic lower back pain   . Hypersomnolence 12/02/2010  . Hematuria - cause not known   . MDD (major depressive disorder), recurrent episode, moderate ([place])   . GERD (gastroesophageal reflux disease)   . OSA (obstructive sleep apnea)   . Torn rotator cuff   . Colon polyp     Blythe Stanford, PT DPT 07/12/2016, 4:09 PM  [place] [facility] [address] [place], [address] Phone:  [phone]   Fax:  [phone]  Name: Gabriel [name] MRN: [id number] Date of Birth: [birth year]

## 2016-07-14 ENCOUNTER — Ambulatory Visit: Payer: Medicare Other

## 2016-07-14 DIAGNOSIS — M25561 Pain in right knee: Secondary | ICD-10-CM | POA: Diagnosis not present

## 2016-07-14 DIAGNOSIS — G8929 Other chronic pain: Secondary | ICD-10-CM

## 2016-07-14 DIAGNOSIS — R2689 Other abnormalities of gait and mobility: Secondary | ICD-10-CM | POA: Diagnosis not present

## 2016-07-14 DIAGNOSIS — M25661 Stiffness of right knee, not elsewhere classified: Secondary | ICD-10-CM | POA: Diagnosis not present

## 2016-07-14 NOTE — Therapy (Signed)
[place] [facility] [address], [place], [address] Phone: [phone]   Fax:  [phone]  Physical Therapy Treatment  Patient Details  Name: Gabriel [name] MRN: [id number] Date of Birth: [birth year] No Data Recorded  Encounter Date: 07/14/2016      PT End of Session - 07/14/16 1405    Visit Number 20   Number of Visits 24   Date for PT Re-Evaluation 07/28/16   Authorization Type G Code 10/10   PT Start Time 6962   PT Stop Time 1430   PT Time Calculation (min) 45 min   Activity Tolerance Patient tolerated treatment well;Patient limited by fatigue;Patient limited by pain   Behavior During Therapy [facility] for tasks assessed/performed      Past Medical History:  Diagnosis Date  . BPH (benign prostatic hypertrophy)    h/o prostate nodule, followed by Dr. Risa Grill yearly  . Cervical neck pain with evidence of disc disease    h/o bulging discs  . Chronic lower back pain    DDD, prior on lyrica, gabapentin, lumbar stenosis with persistent numbness R ant thigh, ESI didn't help, now seeing Branch  . Depression with anxiety    h/o remote hospitalization for depression  . GERD (gastroesophageal reflux disease)   . Hematuria - cause not known    s/p urology workup 2010, nl CT, cystoscopy (doesn't want rpt), cytology  . History of chicken pox   . History of colon polyps 2005   tubular adenoma, on rpt no more (2010)  . Insomnia    Dohmeier  . OSA on CPAP    cpap machine at 10cm, ambien, daytime drowsiness attributed to OSA, last changed dextroamphetamine to nuvigil, previously tried provigil  . Prostate nodule    followed by Risa Grill, reassuring exam and PSA  . Seronegative arthritis    RA, previously on MTX, RF <7, ANA nl (rheum Dr. Donnamarie Poag)  . Torn rotator cuff    bilateral, s/p L repair and now recurrent, chronic massive R RTC tear (supraspinatus)    Past Surgical History:  Procedure Laterality Date  . ACHILLES  TENDON REPAIR  2002  . CATARACT EXTRACTION  2007   left  . COLONOSCOPY  11/2008   diverticulosis, rpt 5 yrs  . COLONOSCOPY  07/2014   2 TA, mild diverticulosis, rpt 5 yrs [name])  . DOBUTAMINE STRESS ECHO  08/2006   negative for ischemia  . LUMBAR DISC SURGERY  07/2010   L3/4 diskectomy ([facility] Harl [facility] at [facility])  . MRI thoracic  09/2010   thoracic DDD/kyphosis, minimal anterolisthesis T2/3 with B foraminal narrowing  . ROTATOR CUFF REPAIR  2008   Left (Dr. Gaylyn Rong in [place], now [place])  . UPPER GASTROINTESTINAL ENDOSCOPY  02/2005   minimal GERD, small gastric polyp, tiny HH    There were no vitals filed for this visit.      Subjective Assessment - 07/14/16 1401    Subjective Patient reports he has not been performing his walking HEP. Patient reports he would like to continue to improve his LE endurance.    Pertinent History sleep apnea; previous achilles tendon repair on the L LE,    Limitations Standing;Walking   How long can you stand comfortably? 25 mins   How long can you walk comfortably? 15 mins   Patient Stated Goals walking longer and standing without pain.   Currently in Pain? No/denies   Pain Onset More than a month ago  TREATMENT  Nustep -- level 9; with 7 min with focus of improving speed Walking forward -- x 627ft with 10# weight in R hand to improve weight shifting Total Gym -- level: 26 x 62min to increase Quad endurance   Leg Press Unilateral Leg Press on R LE only - x20 65#, x20 75# Hip adduction at [facility] machine 10# -- x 20 Hip Machine - x20 #40  Patient reports no increase knee pain throughout session.   Observation: 31mwt: 1.1m/s  LEFS: 48/80        PT Education - 07/14/16 1405    Education provided Yes   Education Details HEP: Sit to stands   Person(s) Educated Patient   Methods Explanation;Demonstration   Comprehension Verbalized understanding;Returned demonstration             PT Long Term Goals - 07/14/16 1406      PT LONG  TERM GOAL #1   Title Pt will be independent with HEP to continue with benefits of therapy after discharge from physical therapy.    Baseline Dependent with exercise form/technique; 05/26/2016: Moderate cueing for exercise performance 07/14/16: Min Cueing for exercise performance   Time 4   Period Weeks   Status On-going     PT LONG TERM GOAL #2   Title Pt will score 60/80 on the LEFS to demonstrate significant improvement in LE function and greater ability to walk without feeling fatigue.    Baseline 41/80; 05/26/2016: LEFS: 53/80 07/14/16: 48/80   Time 4   Period Weeks   Status On-going     PT LONG TERM GOAL #3   Title Patient will score a 0/10 on the VAS as the worst pain in the past week to demonstrate improvement in knee function and pain.    Baseline 7/10 05/26/2016: 3/10 07/14/16: 4/10   Time 4   Period Weeks   Status On-going     PT LONG TERM GOAL #4   Title Patient will score >1.62m/s on 45m walk test to demonstrate signifcant improvement in  gait speed and decrease in fall risk.    Baseline .24m/s 05/26/2016: .14m/s 07/14/16: 1.10m/s   Time 4   Period Weeks   Status On-going               Plan - 07/14/16 1419    Clinical Impression Statement Patient demonstrates improved in 63mwt scores and greater independence with exercise progression. Patient demonstrates reletively unchanged scores on his LEFS and worst pain score. Patient reports he has unchanged worst pain, he reports overall having less instances of pain indicating functional improvement. Patient will benefit from further skilled therapy focused on improving Knee/hip functioning to return to prior level of function.    Rehab Potential Good   Clinical Impairments Affecting Rehab Potential (+) highly motivated, family support; (-) previous achilles tear, chronic knee pain   PT Frequency 1x / week   PT Duration 4 weeks   PT Treatment/Interventions Passive range of motion;Dry needling;Manual techniques;Balance  training;Therapeutic exercise;Therapeutic activities;Patient/family education;Stair training;Gait training;Neuromuscular re-education;Iontophoresis 4mg /ml Dexamethasone;Moist Heat;Ultrasound;Electrical Stimulation   PT Next Visit Plan Progress strengthening, decreasing extensor lag, and quad control   Consulted and Agree with Plan of Care Patient      Patient will benefit from skilled therapeutic intervention in order to improve the following deficits and impairments:  Abnormal gait, Increased fascial restricitons, Pain, Improper body mechanics, Decreased mobility, Increased muscle spasms, Decreased range of motion, Decreased endurance, Decreased strength, Decreased activity tolerance, Decreased balance, Difficulty walking  Visit Diagnosis:  Chronic pain of right knee  Stiffness of right knee, not elsewhere classified       G-Codes - 2016/08/06 1439    Functional Assessment Tool Used (Outpatient Only) LEFS, VAS, 70mwt, MMT, clinical judgement   Functional Limitation Mobility: Walking and moving around   Mobility: Walking and Moving Around Current Status [phone]) At least 20 percent but less than 40 percent impaired, limited or restricted   Mobility: Walking and Moving Around Goal Status [phone]) At least 1 percent but less than 20 percent impaired, limited or restricted      Problem List Patient Active Problem List   Diagnosis Date Noted  . Advanced care planning/counseling discussion 05/30/2016  . Right wrist pain 05/30/2016  . Positive ANA (antinuclear antibody) 02/22/2016  . Right knee pain 11/28/2015  . External hemorrhoid 07/13/2015  . Hearing loss of right ear due to cerumen impaction 05/28/2015  . Obesity, Class I, BMI 30-34.9 05/28/2015  . Benign prostatic hyperplasia   . Medicare annual wellness visit, initial 05/13/2015  . Vitamin B12 deficiency 04/13/2013  . Fatigue 04/12/2013  . Left shoulder pain 09/10/2012  . DDD (degenerative disc disease), lumbar 12/06/2011  . Right  cervical radiculopathy 08/31/2011  . Acquired deformity of nail 08/13/2011  . Healthcare maintenance 06/04/2011  . Insomnia 03/04/2011  . Right shoulder pain 01/06/2011  . Chronic lower back pain   . Hypersomnolence 12/02/2010  . Hematuria - cause not known   . MDD (major depressive disorder), recurrent episode, moderate ([place])   . GERD (gastroesophageal reflux disease)   . OSA (obstructive sleep apnea)   . Torn rotator cuff   . Colon polyp     Blythe Stanford, PT DPT Aug 06, 2016, 2:41 PM  [place] [facility] [address] [place], [address] Phone: [phone]   Fax:  [phone]  Name: Gabriel [name] MRN: [id number] Date of Birth: Aug 06, 1945

## 2016-07-18 ENCOUNTER — Ambulatory Visit: Payer: Medicare Other

## 2016-07-18 DIAGNOSIS — G8929 Other chronic pain: Secondary | ICD-10-CM

## 2016-07-18 DIAGNOSIS — M25561 Pain in right knee: Principal | ICD-10-CM

## 2016-07-18 DIAGNOSIS — M25661 Stiffness of right knee, not elsewhere classified: Secondary | ICD-10-CM | POA: Diagnosis not present

## 2016-07-18 DIAGNOSIS — R2689 Other abnormalities of gait and mobility: Secondary | ICD-10-CM | POA: Diagnosis not present

## 2016-07-18 NOTE — Therapy (Signed)
[facility] [address], [place], [address] Phone: [phone]   Fax:  [phone]  Physical Therapy Treatment  Patient Details  Name: Gabriel [name] MRN: [id number] Date of Birth: 1945-08-18 No Data Recorded  Encounter Date: 07/18/2016      PT End of Session - 07/18/16 1527    Visit Number 21   Number of Visits 24   Date for PT Re-Evaluation 07/28/16   Authorization Type G Code 1/10   PT Start Time 1430   PT Stop Time 1515   PT Time Calculation (min) 45 min   Activity Tolerance Patient tolerated treatment well;Patient limited by fatigue;Patient limited by pain   Behavior During Therapy [facility] for tasks assessed/performed      Past Medical History:  Diagnosis Date  . BPH (benign prostatic hypertrophy)    h/o prostate nodule, followed by Dr. Risa Grill yearly  . Cervical neck pain with evidence of disc disease    h/o bulging discs  . Chronic lower back pain    DDD, prior on lyrica, gabapentin, lumbar stenosis with persistent numbness R ant thigh, ESI didn't help, now seeing [facility]  . Depression with anxiety    h/o remote hospitalization for depression  . GERD (gastroesophageal reflux disease)   . Hematuria - cause not known    s/p urology workup 2010, nl CT, cystoscopy (doesn't want rpt), cytology  . History of chicken pox   . History of colon polyps 2005   tubular adenoma, on rpt no more (2010)  . Insomnia    Dohmeier  . OSA on CPAP    cpap machine at 10cm, ambien, daytime drowsiness attributed to OSA, last changed dextroamphetamine to nuvigil, previously tried provigil  . Prostate nodule    followed by Risa Grill, reassuring exam and PSA  . Seronegative arthritis    RA, previously on MTX, RF <7, ANA nl (rheum Dr. Donnamarie Poag)  . Torn rotator cuff    bilateral, s/p L repair and now recurrent, chronic massive R RTC tear (supraspinatus)    Past Surgical History:  Procedure Laterality Date  . ACHILLES  TENDON REPAIR  2002  . CATARACT EXTRACTION  2007   left  . COLONOSCOPY  11/2008   diverticulosis, rpt 5 yrs  . COLONOSCOPY  07/2014   2 TA, mild diverticulosis, rpt 5 yrs [name])  . DOBUTAMINE STRESS ECHO  08/2006   negative for ischemia  . LUMBAR DISC SURGERY  07/2010   L3/4 [facility]kectomy ([facility])  . MRI thoracic  09/2010   thoracic DDD/kyphosis, minimal anterolisthesis T2/3 with B foraminal narrowing  . ROTATOR CUFF REPAIR  2008   Left (Dr. Gaylyn Rong in [place] now [place])  . UPPER GASTROINTESTINAL ENDOSCOPY  02/2005   minimal GERD, small gastric polyp, tiny HH    There were no vitals filed for this visit.      Subjective Assessment - 07/18/16 1447    Subjective Patient reports increased pain when ambulating today. Patient reports increased spasms radiating  along the medial aspect of his leg.    Pertinent History sleep apnea; previous achilles tendon repair on the L LE,    Limitations Standing;Walking   How long can you stand comfortably? 25 mins   How long can you walk comfortably? 15 mins   Patient Stated Goals walking longer and standing without pain.   Currently in Pain? No/denies   Pain Onset More than a month ago        TREATMENT  Manual therapy: STM to patient's hip adductors and hamstrings on the R LE to decrease increased pain and spasms along the lower leg utilizing superficial and deep techniques.  Therapeutic Exercise: Standing hip adduction/abduction with green band - 2 x 8 reps Seated hip adduction with 4# ball - x 30  Sidelying adduction - x 30 Manually resisted adduction - x 15 Single leg bridges - 2 x 8 B Bridges hooklying - x 12 Hip machine; Hip adduction - x20 25#, x20 40#; Hip extension - x 20 70# Hip adduction at [place] 10# -- x 20   Patient reports decrease in knee pain after performing manual therapy       PT Education - 07/18/16 1527    Education provided Yes   Education Details HEP: hip abduction/adduction with GTB    Person(s) Educated Patient   Methods Explanation;Demonstration   Comprehension Verbalized understanding;Returned demonstration             PT Long Term Goals - 07/14/16 1406      PT LONG TERM GOAL #1   Title Pt will be independent with HEP to continue with benefits of therapy after discharge from physical therapy.    Baseline Dependent with exercise form/technique; 05/26/2016: Moderate cueing for exercise performance 07/14/16: Min Cueing for exercise performance   Time 4   Period Weeks   Status On-going     PT LONG TERM GOAL #2   Title Pt will score 60/80 on the LEFS to demonstrate significant improvement in LE function and greater ability to walk without feeling fatigue.    Baseline 41/80; 05/26/2016: LEFS: 53/80 07/14/16: 48/80   Time 4   Period Weeks   Status On-going     PT LONG TERM GOAL #3   Title Patient will score a 0/10 on the VAS as the worst pain in the past week to demonstrate improvement in knee function and pain.    Baseline 7/10 05/26/2016: 3/10 07/14/16: 4/10   Time 4   Period Weeks   Status On-going     PT LONG TERM GOAL #4   Title Patient will score >1.23m/s on 15m walk test to demonstrate signifcant improvement in  gait speed and decrease in fall risk.    Baseline .78m/s 05/26/2016: .31m/s 07/14/16: 1.50m/s   Time 4   Period Weeks   Status On-going               Plan - 07/18/16 1528    Clinical Impression Statement Patient demonstrates increased TTP along the medial aspect of his R upper aspect of his lower leg and increased tenderness along his medial hamstring tendon indicating possible hip adductor dysfunction. Patient demonstrates decreased strength with performance of hip adduction indicating further possible dysfunction and reason for continued knee pain. Patient will benefit from further skilled therapy focused on decreasing pain to return to prior level of function.    Rehab Potential Good   Clinical Impairments Affecting Rehab Potential (+) highly  motivated, family support; (-) previous achilles tear, chronic knee pain   PT Frequency 1x / week   PT Duration 4 weeks   PT Treatment/Interventions Passive range of motion;Dry needling;Manual techniques;Balance training;Therapeutic exercise;Therapeutic activities;Patient/family education;Stair training;Gait training;Neuromuscular re-education;Iontophoresis 4mg /ml Dexamethasone;Moist Heat;Ultrasound;Electrical Stimulation   PT Next Visit Plan Progress strengthening, decreasing extensor lag, and quad control   Consulted and Agree with Plan of Care Patient      Patient will benefit from skilled therapeutic intervention in order to improve the following deficits and impairments:  Abnormal  gait, Increased fascial restricitons, Pain, Improper body mechanics, Decreased mobility, Increased muscle spasms, Decreased range of motion, Decreased endurance, Decreased strength, Decreased activity tolerance, Decreased balance, Difficulty walking  Visit Diagnosis: Chronic pain of right knee  Stiffness of right knee, not elsewhere classified     Problem List Patient Active Problem List   Diagnosis Date Noted  . Advanced care planning/counseling discussion 05/30/2016  . Right wrist pain 05/30/2016  . Positive ANA (antinuclear antibody) 02/22/2016  . Right knee pain 11/28/2015  . External hemorrhoid 07/13/2015  . Hearing loss of right ear due to cerumen impaction 05/28/2015  . Obesity, Class I, BMI 30-34.9 05/28/2015  . Benign prostatic hyperplasia   . Medicare annual wellness visit, initial 05/13/2015  . Vitamin B12 deficiency 04/13/2013  . Fatigue 04/12/2013  . Left shoulder pain 09/10/2012  . DDD (degenerative disc disease), lumbar 12/06/2011  . Right cervical radiculopathy 08/31/2011  . Acquired deformity of nail 08/13/2011  . Healthcare maintenance 06/04/2011  . Insomnia 03/04/2011  . Right shoulder pain 01/06/2011  . Chronic lower back pain   . Hypersomnolence 12/02/2010  . Hematuria -  cause not known   . MDD (major depressive disorder), recurrent episode, moderate ([place])   . GERD (gastroesophageal reflux disease)   . OSA (obstructive sleep apnea)   . Torn rotator cuff   . Colon polyp     Blythe Stanford, PT DPT 07/18/2016, 3:31 PM  [facility] [address], [place], [address] Phone: [phone]   Fax:  [phone]  Name: JERMELL HOLEMAN MRN: [id number] Date of Birth: April 23, 1945

## 2016-07-19 ENCOUNTER — Other Ambulatory Visit: Payer: Self-pay | Admitting: *Deleted

## 2016-07-19 MED ORDER — ZOLPIDEM TARTRATE 5 MG PO TABS: 5.0000 mg | ORAL_TABLET | Freq: Every evening | ORAL | 1 refills | Status: DC | PRN

## 2016-07-19 NOTE — Telephone Encounter (Signed)
Faxed refill request. Last office visit:   05/30/16 Last Filled:    90 tablet 1 01/05/2016  Please advise.

## 2016-07-19 NOTE — Telephone Encounter (Signed)
Printed and in CMA box 

## 2016-07-20 ENCOUNTER — Telehealth: Payer: Self-pay | Admitting: Neurology

## 2016-07-20 ENCOUNTER — Ambulatory Visit: Payer: Medicare Other

## 2016-07-20 DIAGNOSIS — G8929 Other chronic pain: Secondary | ICD-10-CM

## 2016-07-20 DIAGNOSIS — R2689 Other abnormalities of gait and mobility: Secondary | ICD-10-CM | POA: Diagnosis not present

## 2016-07-20 DIAGNOSIS — M25561 Pain in right knee: Principal | ICD-10-CM

## 2016-07-20 DIAGNOSIS — M25661 Stiffness of right knee, not elsewhere classified: Secondary | ICD-10-CM | POA: Diagnosis not present

## 2016-07-20 NOTE — Therapy (Signed)
[facility] [address] [place], [address] Phone: [phone]   Fax:  [phone]  Physical Therapy Treatment  Patient Details  Name: Gabriel [name] MRN: [id number] Date of Birth: [birth year] No Data Recorded  Encounter Date: 07/20/2016      PT End of Session - 07/20/16 1115    Visit Number 22   Number of Visits 24   Date for PT Re-Evaluation 07/28/16   Authorization Type G Code 2/10   PT Start Time 7672   PT Stop Time 1115   PT Time Calculation (min) 30 min   Activity Tolerance Patient tolerated treatment well;Patient limited by fatigue;Patient limited by pain   Behavior During Therapy [facility] for tasks assessed/performed      Past Medical History:  Diagnosis Date  . BPH (benign prostatic hypertrophy)    h/o prostate nodule, followed by Dr. Risa Grill yearly  . Cervical neck pain with evidence of disc disease    h/o bulging discs  . Chronic lower back pain    DDD, prior on lyrica, gabapentin, lumbar stenosis with persistent numbness R ant thigh, ESI didn't help, now seeing [facility]  . Depression with anxiety    h/o remote hospitalization for depression  . GERD (gastroesophageal reflux disease)   . Hematuria - cause not known    s/p urology workup 2010, nl CT, cystoscopy (doesn't want rpt), cytology  . History of chicken pox   . History of colon polyps 2005   tubular adenoma, on rpt no more (2010)  . Insomnia    [place]meier  . OSA on CPAP    cpap machine at 10cm, ambien, daytime drowsiness attributed to OSA, last changed dextroamphetamine to nuvigil, previously tried provigil  . Prostate nodule    followed by Risa Grill, reassuring exam and PSA  . Seronegative arthritis    RA, previously on MTX, RF <7, ANA nl (rheum Dr. Donnamarie Poag)  . Torn rotator cuff    bilateral, s/p L repair and now recurrent, chronic massive R RTC tear (supraspinatus)    Past Surgical History:  Procedure Laterality Date  . ACHILLES  TENDON REPAIR  2002  . CATARACT EXTRACTION  2007   left  . COLONOSCOPY  11/2008   diverticulosis, rpt 5 yrs  . COLONOSCOPY  07/2014   2 TA, mild diverticulosis, rpt 5 yrs [name])  . DOBUTAMINE STRESS ECHO  08/2006   negative for ischemia  . LUMBAR DISC SURGERY  07/2010   L3/4 diskectomy (Dr. Harl Bowie at [facility])  . MRI thoracic  09/2010   thoracic DDD/kyphosis, minimal anterolisthesis T2/3 with B foraminal narrowing  . ROTATOR CUFF REPAIR  2008   Left (Dr. Gaylyn Rong in [place], now [place])  . UPPER GASTROINTESTINAL ENDOSCOPY  02/2005   minimal GERD, small gastric polyp, tiny HH    There were no vitals filed for this visit.      Subjective Assessment - 07/20/16 1113    Subjective Patient reports he continues to have increased knee pain with ambulation which is worsened when he does not focus on keeping knee straight.    Pertinent History sleep apnea; previous achilles tendon repair on the L LE,    Limitations Standing;Walking   How long can you stand comfortably? 25 mins   How long can you walk comfortably? 15 mins   Patient Stated Goals walking longer and standing without pain.   Currently in Pain? No/denies   Pain Onset More than a month ago  TREATMENT: Therapeutic Exercise:  [employer] single leg squats - 2 x 10; B LE - x 10 Ambulation with 20# with his R UE - 6 x 41ft down and back with focus on improving terminal knee extension and heel strike Hip adduction machine - on the R 85# x 20; on the L x20 #100 Nustep drop sets starting @level  8 for 2 min @ level 7 for 2 min @ level 6 for min Patient demonstrates increased fatigue at end of the session        PT Education - 07/20/16 1115    Education provided Yes   Education Details Form/technique with exercise   Person(s) Educated Patient   Methods Explanation;Demonstration   Comprehension Verbalized understanding;Returned demonstration             PT Long Term Goals - 07/14/16 1406      PT LONG TERM GOAL #1    Title Pt will be independent with HEP to continue with benefits of therapy after discharge from physical therapy.    Baseline Dependent with exercise form/technique; 05/26/2016: Moderate cueing for exercise performance 07/14/16: Min Cueing for exercise performance   Time 4   Period Weeks   Status On-going     PT LONG TERM GOAL #2   Title Pt will score 60/80 on the LEFS to demonstrate significant improvement in LE function and greater ability to walk without feeling fatigue.    Baseline 41/80; 05/26/2016: LEFS: 53/80 07/14/16: 48/80   Time 4   Period Weeks   Status On-going     PT LONG TERM GOAL #3   Title Patient will score a 0/10 on the VAS as the worst pain in the past week to demonstrate improvement in knee function and pain.    Baseline 7/10 05/26/2016: 3/10 07/14/16: 4/10   Time 4   Period Weeks   Status On-going     PT LONG TERM GOAL #4   Title Patient will score >1.30m/s on 19m walk test to demonstrate signifcant improvement in  gait speed and decrease in fall risk.    Baseline .30m/s 05/26/2016: .70m/s 07/14/16: 1.26m/s   Time 4   Period Weeks   Status On-going               Plan - 07/20/16 1119    Clinical Impression Statement Continued to focus on quad and hip strenghening with exercise and patient demonstrates increased fatigue at the end of therapy. Patient reports no increase in pain when performing any exercise but demonstrates increased difficulty with hip adduction and patient will benefit from further skilled therapy focused on improving hip and LE strenthening and will benefit from further skilled therapy to return to prior level of function.    Rehab Potential Good   Clinical Impairments Affecting Rehab Potential (+) highly motivated, family support; (-) previous achilles tear, chronic knee pain   PT Frequency 1x / week   PT Duration 4 weeks   PT Treatment/Interventions Passive range of motion;Dry needling;Manual techniques;Balance training;Therapeutic  exercise;Therapeutic activities;Patient/family education;Stair training;Gait training;Neuromuscular re-education;Iontophoresis 4mg /ml Dexamethasone;Moist Heat;Ultrasound;Electrical Stimulation   PT Next Visit Plan Progress strengthening, decreasing extensor lag, and quad control   Consulted and Agree with Plan of Care Patient      Patient will benefit from skilled therapeutic intervention in order to improve the following deficits and impairments:  Abnormal gait, Increased fascial restricitons, Pain, Improper body mechanics, Decreased mobility, Increased muscle spasms, Decreased range of motion, Decreased endurance, Decreased strength, Decreased activity tolerance, Decreased balance, Difficulty walking  Visit Diagnosis: Chronic pain of right knee  Stiffness of right knee, not elsewhere classified     Problem List Patient Active Problem List   Diagnosis Date Noted  . Advanced care planning/counseling discussion 05/30/2016  . Right wrist pain 05/30/2016  . Positive ANA (antinuclear antibody) 02/22/2016  . Right knee pain 11/28/2015  . External hemorrhoid 07/13/2015  . Hearing loss of right ear due to cerumen impaction 05/28/2015  . Obesity, Class I, BMI 30-34.9 05/28/2015  . Benign prostatic hyperplasia   . Medicare annual wellness visit, initial 05/13/2015  . Vitamin B12 deficiency 04/13/2013  . Fatigue 04/12/2013  . Left shoulder pain 09/10/2012  . DDD (degenerative disc disease), lumbar 12/06/2011  . Right cervical radiculopathy 08/31/2011  . Acquired deformity of nail 08/13/2011  . Healthcare maintenance 06/04/2011  . Insomnia 03/04/2011  . Right shoulder pain 01/06/2011  . Chronic lower back pain   . Hypersomnolence 12/02/2010  . Hematuria - cause not known   . MDD (major depressive disorder), recurrent episode, moderate ([place])   . GERD (gastroesophageal reflux disease)   . OSA (obstructive sleep apnea)   . Torn rotator cuff   . Colon polyp     Blythe Stanford, PT  DPT 07/20/2016, 11:27 AM  [facility] [address] [place], [address] Phone: [phone]   Fax:  [phone]  Name: Gabriel [name] MRN: [id number] Date of Birth: 1945-05-09

## 2016-07-20 NOTE — Telephone Encounter (Signed)
Rx faxed to [facility] [phone].

## 2016-07-20 NOTE — Telephone Encounter (Signed)
Pt called to ck on status of refills for zolpidem and modafinil; advised pt zolpidem [facility] 07/20/16 at 12:30 pm. Pt will ck with pharmacy about refill for modafinil; Dr Rexene Alberts is prescribing doctor on med list for modafinil. Pt voiced understanding.

## 2016-07-20 NOTE — Telephone Encounter (Signed)
Tiera with [facility] is calling regarding modafinil (PROVIGIL) 100 MG tablet. She has authorization but needs criteria at the end of PA.

## 2016-07-21 NOTE — Telephone Encounter (Addendum)
Returned call to [employer]. Was informed that PA was denied pending additional information. Transferred to appeals dept, case # [id number]. Letter was sent to both pt and physician explaining why med was not approved w/ instructions to appeal if desired.

## 2016-07-21 NOTE — Telephone Encounter (Signed)
Noted, will wait for this fax. There has been no documentation that our office has done a pa for pt's modafinil since 04/07/2015.

## 2016-07-22 NOTE — Telephone Encounter (Signed)
Called [facility], spoke with Amber who stated previous PA expired in Feb 2018. [facility] initiated new PA, faxed paperwork on 07/14/16. She stated fax did not go through. She stated she would do PA on phone. PA approved with Case id #[id number], effective from  06/22/2016 through 07/22/2017 for Modafinil. She stated provider and patient will be notified.

## 2016-07-22 NOTE — Telephone Encounter (Signed)
LVM informing patient that this RN did PA for Modafinil. It is approved through July 22, 2017. Repeated this information, left office number and advised the office closes at noon today.

## 2016-07-22 NOTE — Telephone Encounter (Signed)
Patient called office in reference to appeal for Modafinil patient called [facility] and they advised patient someone from our office to contact [facility] to answer a few questions.  Advised patient Dr. Tori Milks nurse is out of the office and if this could wait until Monday, per patient he asked if someone can call today.  [facility] [phone]

## 2016-07-26 ENCOUNTER — Ambulatory Visit: Payer: Medicare Other

## 2016-07-27 ENCOUNTER — Ambulatory Visit: Payer: Medicare Other

## 2016-07-27 DIAGNOSIS — M25661 Stiffness of right knee, not elsewhere classified: Secondary | ICD-10-CM

## 2016-07-27 DIAGNOSIS — G8929 Other chronic pain: Secondary | ICD-10-CM

## 2016-07-27 DIAGNOSIS — M25561 Pain in right knee: Secondary | ICD-10-CM | POA: Diagnosis not present

## 2016-07-27 DIAGNOSIS — R2689 Other abnormalities of gait and mobility: Secondary | ICD-10-CM | POA: Diagnosis not present

## 2016-07-27 NOTE — Therapy (Signed)
[facility] [address] [place], [address] Phone: [phone]   Fax:  [phone]  Physical Therapy Treatment  Patient Details  Name: [name] MRN: [id number] Date of Birth: July 23, 1945 No Data Recorded  Encounter Date: 07/27/2016      PT End of Session - 07/27/16 0932    Visit Number 23   Number of Visits 24   Date for PT Re-Evaluation 07/28/16   Authorization Type G Code 3/10   PT Start Time 0900   PT Stop Time 0945   PT Time Calculation (min) 45 min   Activity Tolerance Patient tolerated treatment well;Patient limited by fatigue;Patient limited by pain   Behavior During Therapy [facility] for tasks assessed/performed      Past Medical History:  Diagnosis Date  . BPH (benign prostatic hypertrophy)    h/o prostate nodule, followed by Dr. Risa Grill yearly  . Cervical neck pain with evidence of disc disease    h/o bulging discs  . Chronic lower back pain    DDD, prior on lyrica, gabapentin, lumbar stenosis with persistent numbness R ant thigh, ESI didn't help, now seeing [facility]  . Depression with anxiety    h/o remote hospitalization for depression  . GERD (gastroesophageal reflux disease)   . Hematuria - cause not known    s/p urology workup 2010, nl CT, cystoscopy (doesn't want rpt), cytology  . History of chicken pox   . History of colon polyps 2005   tubular adenoma, on rpt no more (2010)  . Insomnia    [place]  . OSA on CPAP    cpap machine at 10cm, ambien, daytime drowsiness attributed to OSA, last changed dextroamphetamine to nuvigil, previously tried provigil  . Prostate nodule    followed by Risa Grill, reassuring exam and PSA  . Seronegative arthritis    RA, previously on MTX, RF <7, ANA nl (rheum Dr. Donnamarie Poag)  . Torn rotator cuff    bilateral, s/p L repair and now recurrent, chronic massive R RTC tear (supraspinatus)    Past Surgical History:  Procedure Laterality Date  . ACHILLES  TENDON REPAIR  2002  . CATARACT EXTRACTION  2007   left  . COLONOSCOPY  11/2008   diverticulosis, rpt 5 yrs  . COLONOSCOPY  07/2014   2 TA, mild diverticulosis, rpt 5 yrs [name])  . DOBUTAMINE STRESS ECHO  08/2006   negative for ischemia  . LUMBAR DISC SURGERY  07/2010   L3/4 diskectomy (Dr. Harl Bowie at [facility])  . MRI thoracic  09/2010   thoracic DDD/kyphosis, minimal anterolisthesis T2/3 with B foraminal narrowing  . ROTATOR CUFF REPAIR  2008   Left (Dr. Gaylyn Rong in [place], now [place])  . UPPER GASTROINTESTINAL ENDOSCOPY  02/2005   minimal GERD, small gastric polyp, tiny HH    There were no vitals filed for this visit.      Subjective Assessment - 07/27/16 0900    Subjective Patient reports no real episodes of pain and reports he had a "twinge" in his knee yesterdaywhen walking sideways but it did not result in any pain.    Pertinent History sleep apnea; previous achilles tendon repair on the L LE,    Limitations Standing;Walking   How long can you stand comfortably? 25 mins   How long can you walk comfortably? 15 mins   Patient Stated Goals walking longer and standing without pain.   Currently in Pain? No/denies   Pain Onset More than a month ago  TREATMENT: Therapeutic Exercise:  Total Gym single leg squats -  x 10 level 24; B LE - x 15 level 26 Leg Press at [place] 35#; x10 55# Hip external rotation in standing with YTB - x20 Side stepping up and over Bosu - x 20  Elliptical - 2 min with cueing on stride length  Squats with UE support - x10 and focus on improving  Sit to stands without UE support - x 20  Nustep drop sets starting @level  8 for 8 min  Patient demonstrates increased fatigue at end of the session        PT Education - 07/27/16 0915    Education provided Yes   Education Details form/technique with exercise   Person(s) Educated Patient   Methods Explanation;Demonstration   Comprehension Verbalized understanding;Returned demonstration              PT Long Term Goals - 07/14/16 1406      PT LONG TERM GOAL #1   Title Pt will be independent with HEP to continue with benefits of therapy after discharge from physical therapy.    Baseline Dependent with exercise form/technique; 05/26/2016: Moderate cueing for exercise performance 07/14/16: Min Cueing for exercise performance   Time 4   Period Weeks   Status On-going     PT LONG TERM GOAL #2   Title Pt will score 60/80 on the LEFS to demonstrate significant improvement in LE function and greater ability to walk without feeling fatigue.    Baseline 41/80; 05/26/2016: LEFS: 53/80 07/14/16: 48/80   Time 4   Period Weeks   Status On-going     PT LONG TERM GOAL #3   Title Patient will score a 0/10 on the VAS as the worst pain in the past week to demonstrate improvement in knee function and pain.    Baseline 7/10 05/26/2016: 3/10 07/14/16: 4/10   Time 4   Period Weeks   Status On-going     PT LONG TERM GOAL #4   Title Patient will score >1.71m/s on 48m walk test to demonstrate signifcant improvement in  gait speed and decrease in fall risk.    Baseline .22m/s 05/26/2016: .81m/s 07/14/16: 1.21m/s   Time 4   Period Weeks   Status On-going               Plan - 07/27/16 0933    Clinical Impression Statement Focused on performing exercises towards further establishing HEP and discussed discharge over the next visit. Patient reports he is prepared for discharge next visit and will focus on consolidating HEP over the next visit. Patient demonstrates improvement in knee strength and reports he's been performing exercises for fitness. Patient will benefit from further skilled therapy focused on improving strength to return to prior level of function.    Rehab Potential Good   Clinical Impairments Affecting Rehab Potential (+) highly motivated, family support; (-) previous achilles tear, chronic knee pain   PT Frequency 1x / week   PT Duration 4 weeks   PT Treatment/Interventions  Passive range of motion;Dry needling;Manual techniques;Balance training;Therapeutic exercise;Therapeutic activities;Patient/family education;Stair training;Gait training;Neuromuscular re-education;Iontophoresis 4mg /ml Dexamethasone;Moist Heat;Ultrasound;Electrical Stimulation   PT Next Visit Plan Progress strengthening, decreasing extensor lag, and quad control   Consulted and Agree with Plan of Care Patient      Patient will benefit from skilled therapeutic intervention in order to improve the following deficits and impairments:  Abnormal gait, Increased fascial restricitons, Pain, Improper body mechanics, Decreased mobility, Increased muscle spasms, Decreased  range of motion, Decreased endurance, Decreased strength, Decreased activity tolerance, Decreased balance, Difficulty walking  Visit Diagnosis: Chronic pain of right knee  Stiffness of right knee, not elsewhere classified  Other abnormalities of gait and mobility     Problem List Patient Active Problem List   Diagnosis Date Noted  . Advanced care planning/counseling discussion 05/30/2016  . Right wrist pain 05/30/2016  . Positive ANA (antinuclear antibody) 02/22/2016  . Right knee pain 11/28/2015  . External hemorrhoid 07/13/2015  . Hearing loss of right ear due to cerumen impaction 05/28/2015  . Obesity, Class I, BMI 30-34.9 05/28/2015  . Benign prostatic hyperplasia   . Medicare annual wellness visit, initial 05/13/2015  . Vitamin B12 deficiency 04/13/2013  . Fatigue 04/12/2013  . Left shoulder pain 09/10/2012  . DDD (degenerative disc disease), lumbar 12/06/2011  . Right cervical radiculopathy 08/31/2011  . Acquired deformity of nail 08/13/2011  . Healthcare maintenance 06/04/2011  . Insomnia 03/04/2011  . Right shoulder pain 01/06/2011  . Chronic lower back pain   . Hypersomnolence 12/02/2010  . Hematuria - cause not known   . MDD (major depressive disorder), recurrent episode, moderate ([place])   . GERD  (gastroesophageal reflux disease)   . OSA (obstructive sleep apnea)   . Torn rotator cuff   . Colon polyp     Blythe Stanford, PT DPT 07/27/2016, 9:50 AM  [facility] [address], [place], [address] Phone: [phone]   Fax:  [phone]  Name: Gabriel [name] MRN: [id number] Date of Birth: 1945-10-21

## 2016-07-28 ENCOUNTER — Ambulatory Visit: Payer: Medicare Other

## 2016-08-04 ENCOUNTER — Encounter: Payer: Self-pay | Admitting: Family Medicine

## 2016-08-04 ENCOUNTER — Ambulatory Visit (INDEPENDENT_AMBULATORY_CARE_PROVIDER_SITE_OTHER): Payer: Medicare Other | Admitting: Family Medicine

## 2016-08-04 ENCOUNTER — Ambulatory Visit: Payer: Medicare Other

## 2016-08-04 VITALS — BP 146/76 | HR 67 | Temp 98.1°F | Ht 67.0 in | Wt 213.0 lb

## 2016-08-04 DIAGNOSIS — G4710 Hypersomnia, unspecified: Secondary | ICD-10-CM

## 2016-08-04 DIAGNOSIS — W57XXXA Bitten or stung by nonvenomous insect and other nonvenomous arthropods, initial encounter: Secondary | ICD-10-CM

## 2016-08-04 DIAGNOSIS — M25511 Pain in right shoulder: Secondary | ICD-10-CM

## 2016-08-04 DIAGNOSIS — M75101 Unspecified rotator cuff tear or rupture of right shoulder, not specified as traumatic: Secondary | ICD-10-CM

## 2016-08-04 DIAGNOSIS — M25661 Stiffness of right knee, not elsewhere classified: Secondary | ICD-10-CM

## 2016-08-04 DIAGNOSIS — G8929 Other chronic pain: Secondary | ICD-10-CM

## 2016-08-04 DIAGNOSIS — R2689 Other abnormalities of gait and mobility: Secondary | ICD-10-CM | POA: Diagnosis not present

## 2016-08-04 DIAGNOSIS — S30860A Insect bite (nonvenomous) of lower back and pelvis, initial encounter: Secondary | ICD-10-CM | POA: Diagnosis not present

## 2016-08-04 DIAGNOSIS — M25561 Pain in right knee: Secondary | ICD-10-CM | POA: Diagnosis not present

## 2016-08-04 DIAGNOSIS — R5383 Other fatigue: Secondary | ICD-10-CM | POA: Diagnosis not present

## 2016-08-04 MED ORDER — MELOXICAM 7.5 MG PO TABS: 7.5000 mg | ORAL_TABLET | Freq: Every day | ORAL | 3 refills | Status: DC

## 2016-08-04 NOTE — Progress Notes (Signed)
BP (!) 146/76 (BP Location: Left Arm, Patient Position: Sitting, Cuff Size: Normal)   Pulse 67   Temp 98.1 F (36.7 C) (Oral)   Ht 5\' 7"  (1.702 m)   Wt 213 lb (96.6 kg)   SpO2 93%   BMI 33.36 kg/m    CC: R arm pain Subjective:    Patient ID: Gabriel [name], male    DOB: 10/12/1945, 71 y.o.   MRN: [id number]  HPI: AUL MANGIERI is a 71 y.o. male presenting on 08/04/2016 for Arm Pain (right )   Mother passed away 01/27/2016.  Here today with worsening R>L arm pain. He also endorses bilateral hand swelling and stiffness sensation.   Current pain regimen includes meloxicam 15mg  PRN, tylenol 500mg  PRN, and hydrocodone/apap.   Known bilateral RTC tears s/p L repair then massive R RTC tear. He has recently been followed by Dr Mardelle Matte.  He also had recent R knee surgery, completed physical therapy.   Ongoing fatigue despite provigil. Planning to return to see neurology.   Relevant past medical, surgical, family and social history reviewed and updated as indicated. Interim medical history since our last visit reviewed. Allergies and medications reviewed and updated. Outpatient Medications Prior to Visit  Medication Sig Dispense Refill  . acetaminophen (TYLENOL) 500 MG tablet Take 500 mg by mouth every 6 (six) hours as needed.    Marland Kitchen buPROPion (WELLBUTRIN XL) 300 MG 24 hr tablet TAKE 1 TABLET DAILY 90 tablet 0  . HYDROcodone-acetaminophen (NORCO/VICODIN) 5-325 MG tablet Take 1 tablet by mouth as needed for moderate pain.    Marland Kitchen loratadine (CLARITIN) 10 MG tablet Take 10 mg by mouth daily as needed for allergies.    . Melatonin 5 MG CAPS Take 5 mg by mouth at bedtime.     . methocarbamol (ROBAXIN) 750 MG tablet Take 1 tablet (750 mg total) by mouth 3 (three) times daily as needed. 270 tablet 1  . modafinil (PROVIGIL) 100 MG tablet Take 1 tablet (100 mg total) by mouth 2 (two) times daily. 180 tablet 3  . omeprazole (PRILOSEC) 20 MG capsule TAKE 1 CAPSULE DAILY 90 capsule 3  . psyllium  (REGULOID) 0.52 g capsule Take 0.52 g by mouth daily as needed.    . tamsulosin (FLOMAX) 0.4 MG CAPS capsule Take 0.4 mg by mouth at bedtime.    . vitamin B-12 (CYANOCOBALAMIN) 1000 MCG tablet Take 1,000 mcg by mouth daily.    Marland Kitchen zolpidem (AMBIEN) 5 MG tablet Take 1 tablet (5 mg total) by mouth at bedtime as needed for sleep. 90 tablet 1  . meloxicam (MOBIC) 7.5 MG tablet Take 7.5 mg by mouth daily as needed for pain.     No facility-administered medications prior to visit.      Per HPI unless specifically indicated in ROS section below Review of Systems     Objective:    BP (!) 146/76 (BP Location: Left Arm, Patient Position: Sitting, Cuff Size: Normal)   Pulse 67   Temp 98.1 F (36.7 C) (Oral)   Ht 5\' 7"  (1.702 m)   Wt 213 lb (96.6 kg)   SpO2 93%   BMI 33.36 kg/m   Wt Readings from Last 3 Encounters:  08/04/16 213 lb (96.6 kg)  05/30/16 215 lb (97.5 kg)  05/26/16 215 lb 12 oz (97.9 kg)    Physical Exam  Constitutional: He appears well-developed and well-nourished. No distress.  HENT:  Mouth/Throat: Oropharynx is clear and moist. No oropharyngeal exudate.  Cardiovascular: Normal rate,  regular rhythm, normal heart sounds and intact distal pulses.   No murmur heard. Pulmonary/Chest: Effort normal and breath sounds normal. No respiratory distress. He has no wheezes. He has no rales.  Musculoskeletal:  Tender to palpation anterior R shoulder, no pain at bursa  Skin: Skin is warm and dry. No rash noted.  Presumed tick bite R upper back without surrounding erythema or retained tick parts  Nursing note and vitals reviewed.  Results for orders placed or performed in visit on 66/[birth year]  Basic metabolic panel  Result Value Ref Range   Sodium 139 135 - 145 mEq/L   Potassium 4.2 3.5 - 5.1 mEq/L   Chloride 104 96 - 112 mEq/L   CO2 28 19 - 32 mEq/L   Glucose, Bld 89 70 - 99 mg/dL   BUN 18 6 - 23 mg/dL   Creatinine, Ser 1.15 0.40 - 1.50 mg/dL   Calcium 9.2 8.4 - 10.5 mg/dL   GFR  67.11 >60.00 mL/min  Vitamin B12  Result Value Ref Range   Vitamin B-12 740 211 - 911 pg/mL      Assessment & Plan:  Over 25 minutes were spent face-to-face with the patient during this encounter and >50% of that time was spent on counseling and coordination of care  Problem List Items Addressed This Visit    Fatigue   Hypersomnolence    Suggested he decrease robaxin use as well as other sedating meds.       Right shoulder pain - Primary    H/o bilateral RTC tears s/p L repair.  Reviewed pain regimen. If not improving, will contact ortho.  Discussed possible supplements of benefit.      Tick bite of back    Presumed. Discussed red flags to seek further care. No signs of tick borne illness today.       Torn rotator cuff    Chronic right sided. Anticipate pain related to this. Reviewed pain regimen and gave recommendations.           Follow up plan: Return in about 3 months (around 11/04/2016), or if symptoms worsen or fail to improve, for follow up visit.  Ria Bush, MD

## 2016-08-04 NOTE — Assessment & Plan Note (Signed)
Presumed. Discussed red flags to seek further care. No signs of tick borne illness today.

## 2016-08-04 NOTE — Assessment & Plan Note (Signed)
Suggested he decrease robaxin use as well as other sedating meds.

## 2016-08-04 NOTE — Patient Instructions (Addendum)
Start meloxicam 7.5mg  daily.  Start tylenol 500mg  nightly.  Then continue tylenol and hydrocodone/apap as needed. If no better , consider return to Dr Mardelle Matte.  Look into turmeric and glucosamine.  Consider cutting down on methocarbamol which can cause sedation.

## 2016-08-04 NOTE — Therapy (Signed)
[name] [address] [place], [address] Phone: [phone]   Fax:  [phone]  Physical Therapy Treatment/Discharge Summary  Patient Details  Name: Gabriel [name] MRN: [id number] Date of Birth: 02/06/1946 No Data Recorded  Encounter Date: 08/04/2016   Patient has partially met or met all long term goals and will benefit from further skilled therapy to return to prior level of function.      PT End of Session - 08/04/16 1314    Visit Number 24   Number of Visits 24   Date for PT Re-Evaluation 07/28/16   Authorization Type G Code 4/10   PT Start Time 4765   PT Stop Time 1345   PT Time Calculation (min) 42 min   Activity Tolerance Patient tolerated treatment well;Patient limited by fatigue;Patient limited by pain   Behavior During Therapy [name] for tasks assessed/performed      Past Medical History:  Diagnosis Date  . BPH (benign prostatic hypertrophy)    h/o prostate nodule, followed by Gabriel [name] yearly  . Cervical neck pain with evidence of disc disease    h/o bulging discs  . Chronic lower back pain    DDD, prior on lyrica, gabapentin, lumbar stenosis with persistent numbness R ant thigh, ESI didn't help, now seeing Gabriel [name]  . Depression with anxiety    h/o remote hospitalization for depression  . GERD (gastroesophageal reflux disease)   . Hematuria - cause not known    s/p urology workup 2010, nl CT, cystoscopy (doesn't want rpt), cytology  . History of chicken pox   . History of colon polyps 2005   tubular adenoma, on rpt no more (2010)  . Insomnia    [name]  . OSA on CPAP    cpap machine at 10cm, ambien, daytime drowsiness attributed to OSA, last changed dextroamphetamine to nuvigil, previously tried provigil  . Prostate nodule    followed by Gabriel [name], reassuring exam and PSA  . Seronegative arthritis    RA, previously on MTX, RF <7, ANA nl (rheum Dr. Donnamarie [name])  . Torn rotator cuff    bilateral, s/p L repair and now recurrent, chronic massive R RTC tear (supraspinatus)    Past Surgical History:  Procedure Laterality Date  . ACHILLES TENDON REPAIR  2002  . CATARACT EXTRACTION  2007   left  . COLONOSCOPY  11/2008   diverticulosis, rpt 5 yrs  . COLONOSCOPY  07/2014   2 TA, mild diverticulosis, rpt 5 yrs [name])  . DOBUTAMINE STRESS ECHO  08/2006   negative for ischemia  . LUMBAR DISC SURGERY  07/2010   L3/4 diskectomy ([name] at [name])  . MRI thoracic  09/2010   thoracic DDD/kyphosis, minimal anterolisthesis T2/3 with B foraminal narrowing  . ROTATOR CUFF REPAIR  2008   Left (Dr. Gaylyn [name] in [place], now [place])  . UPPER GASTROINTESTINAL ENDOSCOPY  02/2005   minimal GERD, small gastric polyp, tiny HH    There were no vitals filed for this visit.      Subjective Assessment - 08/04/16 1310    Subjective Patient reports he has performed exercises at the [facility]. Patient states his arm is hurting and is going to his doctor today to see his options for his shoulder pain.    Pertinent History sleep apnea; previous achilles tendon repair on the L LE,    Limitations Standing;Walking   How long can you stand comfortably? 25 mins   How long can you walk comfortably?  15 mins   Patient Stated Goals walking longer and standing without pain.   Currently in Pain? No/denies   Pain Onset More than a month ago        TREATMENT: Therapeutic Exercise:  Total Gym double LE - 2 x 10 level 24;  Leg Press at [name] 35# single LE; x10 55# Nustep drop sets starting '@level'  8 for 8 min Hip machine with hip abduction, extension -- x20 abduction: 40#, 100#  Patient demonstrates increased fatigue at end of the session       PT Education - 08/04/16 1314    Education provided Yes   Education Details HEP review   Person(s) Educated Patient   Methods Explanation;Demonstration   Comprehension Verbalized understanding;Returned demonstration             PT Long Term  Goals - 08/04/16 1315      PT LONG TERM GOAL #1   Title Pt will be independent with HEP to continue with benefits of therapy after discharge from physical therapy.    Baseline Dependent with exercise form/technique; 05/26/2016: Moderate cueing for exercise performance 07/14/16: Min Cueing for exercise performance; 08/04/16: Independent with exercise   Time 4   Period Weeks   Status Achieved     PT LONG TERM GOAL #2   Title Pt will score 60/80 on the LEFS to demonstrate significant improvement in LE function and greater ability to walk without feeling fatigue.    Baseline 41/80; 05/26/2016: LEFS: 53/80 07/14/16: 48/80; 08/04/16: 55/80   Time 4   Period Weeks   Status Partially Met     PT LONG TERM GOAL #3   Title Patient will score a 0/10 on the VAS as the worst pain in the past week to demonstrate improvement in knee function and pain.    Baseline 7/10 05/26/2016: 3/10 07/14/16: 4/10 08/04/16: 4/10   Time 4   Period Weeks   Status Partially Met     PT LONG TERM GOAL #4   Title Patient will score >1.2ms on 133malk test to demonstrate signifcant improvement in  gait speed and decrease in fall risk.    Baseline .83m12m4/19/2018: .14m383m/7/18: 1.47m/s39mTime 4   Period Weeks   Status Achieved               Plan - 08/04/16 1318    Clinical Impression Statement Patient has met or partially met all long term goals demonstrating improved knee pain and knee function. Patient is independent with HEP and [facility] Patient will be discharged from physical therapy.    Rehab Potential Good   Clinical Impairments Affecting Rehab Potential (+) highly motivated, family support; (-) previous achilles tear, chronic knee pain   PT Frequency 1x / week   PT Duration 4 weeks   PT Treatment/Interventions Passive range of motion;Dry needling;Manual techniques;Balance training;Therapeutic exercise;Therapeutic activities;Patient/family education;Stair training;Gait  training;Neuromuscular re-education;Iontophoresis 4mg/m83mexamethasone;Moist Heat;Ultrasound;Electrical Stimulation   PT Next Visit Plan Progress strengthening, decreasing extensor lag, and quad control   Consulted and Agree with Plan of Care Patient      Patient will benefit from skilled therapeutic intervention in order to improve the following deficits and impairments:  Abnormal gait, Increased fascial restricitons, Pain, Improper body mechanics, Decreased mobility, Increased muscle spasms, Decreased range of motion, Decreased endurance, Decreased strength, Decreased activity tolerance, Decreased balance, Difficulty walking  Visit Diagnosis: Chronic pain of right knee - Plan: PT plan of care cert/re-cert  Stiffness of right knee, not elsewhere classified - Plan: PT plan of care cert/re-cert       G-Codes - 08-16-16 1329    Functional Assessment Tool Used (Outpatient Only) LEFS, VAS, 63mt, MMT, clinical judgement   Functional Limitation Mobility: Walking and moving around   Mobility: Walking and Moving Around Current Status ((J1941 At least 1 percent but less than 20 percent impaired, limited or restricted   Mobility: Walking and Moving Around Goal Status ([id number] At least 1 percent but less than 20 percent impaired, limited or restricted      Problem List Patient Active Problem List   Diagnosis Date Noted  . Advanced care planning/counseling discussion 05/30/2016  . Right wrist pain 05/30/2016  . Positive ANA (antinuclear antibody) 02/22/2016  . Right knee pain 11/28/2015  . External hemorrhoid 07/13/2015  . Hearing loss of right ear due to cerumen impaction 05/28/2015  . Obesity, Class I, BMI 30-34.9 05/28/2015  . Benign prostatic hyperplasia   . Medicare annual wellness visit, initial 05/13/2015  . Vitamin B12 deficiency 04/13/2013  . Fatigue 04/12/2013  . Left shoulder pain 09/10/2012  . DDD (degenerative disc disease), lumbar 12/06/2011  . Right cervical radiculopathy  08/31/2011  . Acquired deformity of nail 08/13/2011  . Healthcare maintenance 06/04/2011  . Insomnia 03/04/2011  . Right shoulder pain 01/06/2011  . Chronic lower back pain   . Hypersomnolence 12/02/2010  . Hematuria - cause not known   . MDD (major depressive disorder), recurrent episode, moderate ([place]   . GERD (gastroesophageal reflux disease)   . OSA (obstructive sleep apnea)   . Torn rotator cuff   . Colon polyp     WBlythe Stanford PT DPT 62018-07-10 1:33 PM  [name] [address] [place] [address]Phone: [phone]  Fax:  [phone] Name: Gabriel BEDELMRN: [id number] of Birth: 112/16/1947

## 2016-08-04 NOTE — Assessment & Plan Note (Addendum)
H/o bilateral RTC tears s/p L repair.  Reviewed pain regimen. If not improving, will contact ortho.  Discussed possible supplements of benefit.

## 2016-08-04 NOTE — Assessment & Plan Note (Signed)
Chronic right sided. Anticipate pain related to this. Reviewed pain regimen and gave recommendations.

## 2016-08-11 ENCOUNTER — Telehealth: Payer: Self-pay

## 2016-08-11 NOTE — Telephone Encounter (Signed)
Pt left v/m; pt seen 08/04/16 and pt is feeling better; the intense pain in the arm is gone; still has feeling of swollen fingers and some discomfort in arm ; pt taking meloxicam 7.5 mg in AM and tylenol at HS as instructed and pt wants to know if should continue the meloxicam and tylenol or what to do now. Pt request cb.

## 2016-08-12 DIAGNOSIS — Z008 Encounter for other general examination: Secondary | ICD-10-CM | POA: Diagnosis not present

## 2016-08-12 NOTE — Telephone Encounter (Signed)
Spoke to pt and advised. Pt verbally expressed understanding. 

## 2016-08-12 NOTE — Telephone Encounter (Signed)
I'm glad he's feeling better. Do recommend continue current regimen of meloxicam 7.5mg  in am and tylenol QHS. May take extra tylenol as needed, but don't recommend extra meloxicam or any other NSAIDs like aleve, advil, ibuprofen.

## 2016-08-15 DIAGNOSIS — Z125 Encounter for screening for malignant neoplasm of prostate: Secondary | ICD-10-CM | POA: Diagnosis not present

## 2016-08-15 DIAGNOSIS — R350 Frequency of micturition: Secondary | ICD-10-CM | POA: Diagnosis not present

## 2016-08-15 DIAGNOSIS — N401 Enlarged prostate with lower urinary tract symptoms: Secondary | ICD-10-CM | POA: Diagnosis not present

## 2016-08-16 IMAGING — MR MR SHOULDER*R* W/O CM
5 series · 35 of 40 positions shown · non-contrast
Comparison: None.

CLINICAL DATA: Chronic pain and limited range of motion.

EXAM:
MRI OF THE RIGHT SHOULDER WITHOUT CONTRAST
TECHNIQUE: Multiplanar, multisequence MR imaging of the shoulder was performed.
No intravenous contrast was administered.

[Series 3: T2 fat-sat · axial · 4.0mm · 0.55mm/px · z∈[-16,+71]mm · 8 of 19 slices shown (1 of 3)]
[im 1/19]
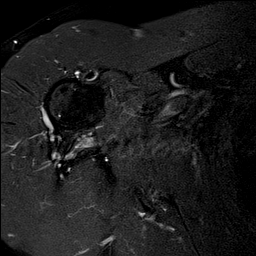
[im 3/19]
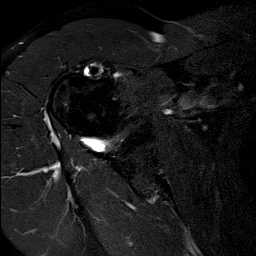
[im 6/19]
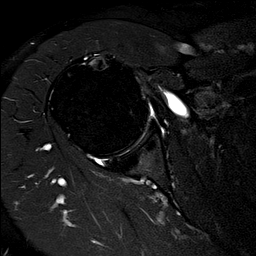
[im 8/19]
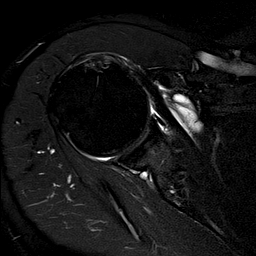
[im 11/19]
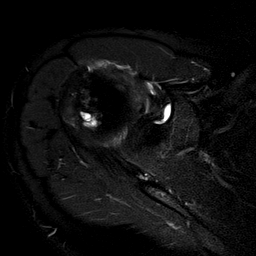
[im 13/19]
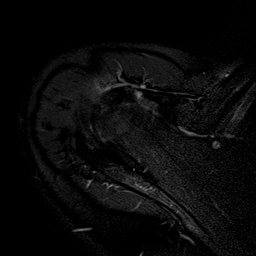
[im 16/19]
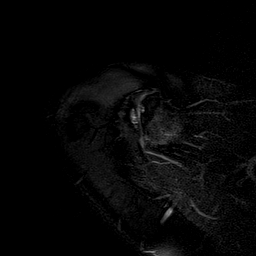
[im 19/19]
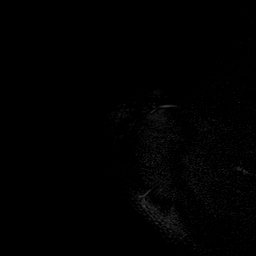

[Series 4: T2 fat-sat · oblique · 4.0mm · 0.55mm/px · 8 of 18 slices shown (2 of 3)]
[im 1/18]
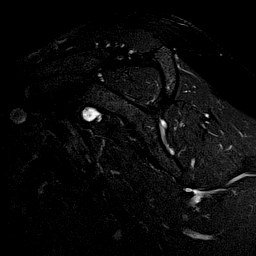
[im 3/18]
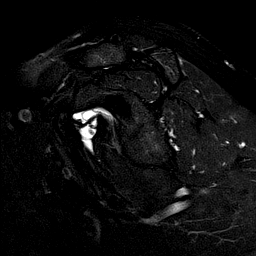
[im 5/18]
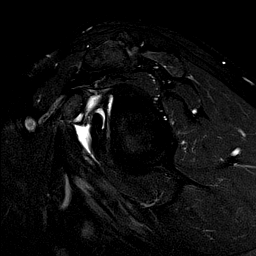
[im 8/18]
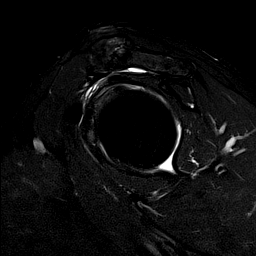
[im 10/18]
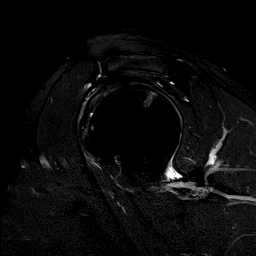
[im 13/18]
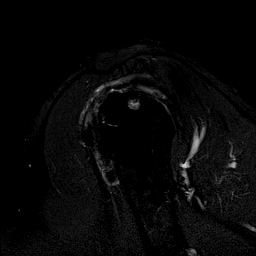
[im 15/18]
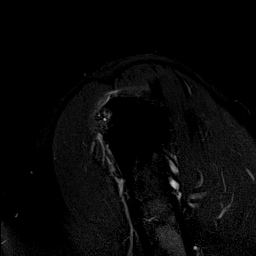
[im 18/18]
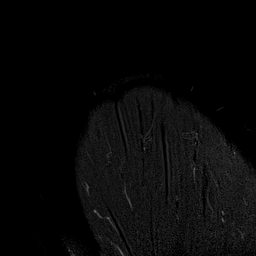

[Series 5: T2 fat-sat · oblique · 4.0mm · 0.27mm/px · 8 of 17 slices shown (3 of 3)]
[im 1/17]
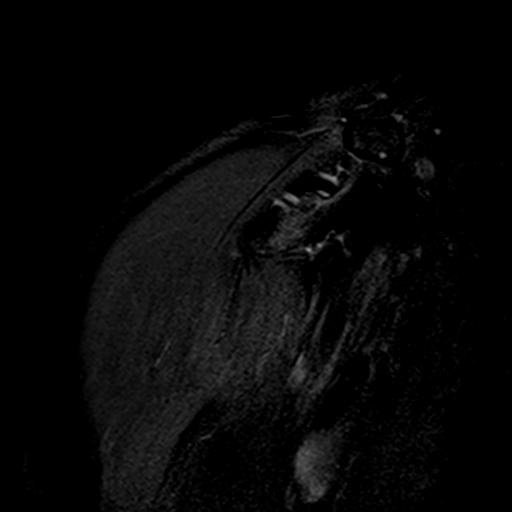
[im 3/17]
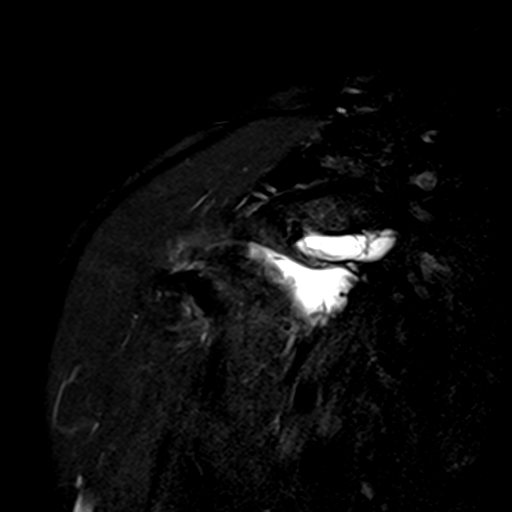
[im 5/17]
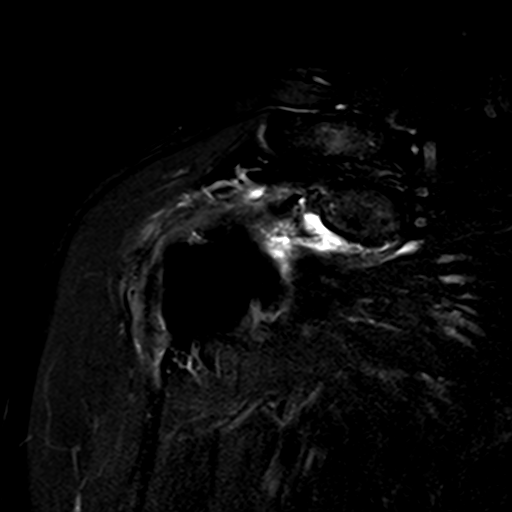
[im 7/17]
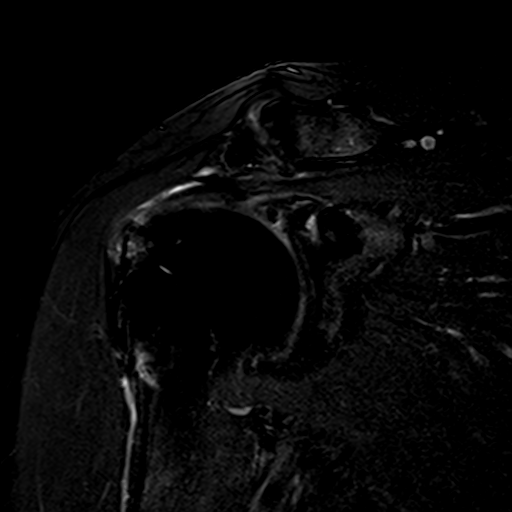
[im 10/17]
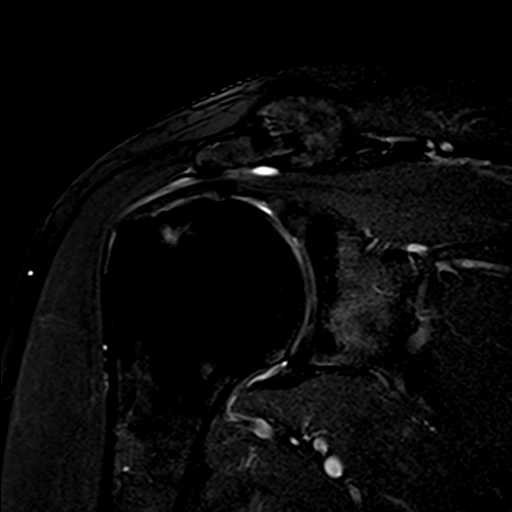
[im 12/17]
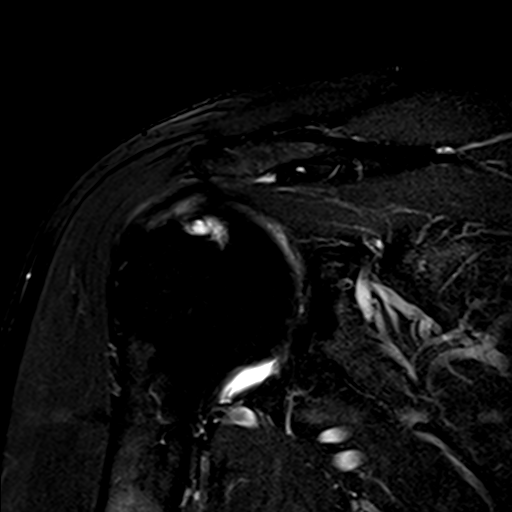
[im 14/17]
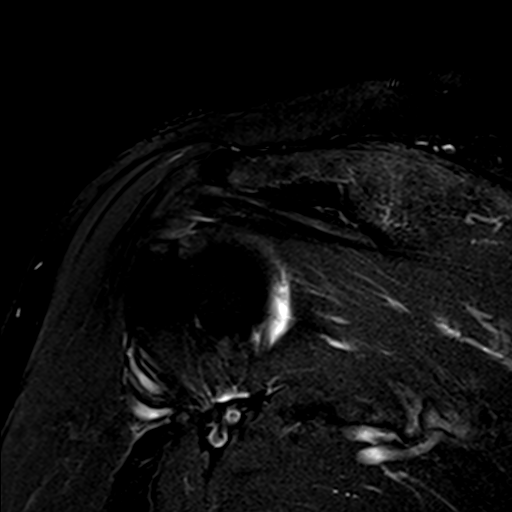
[im 17/17]
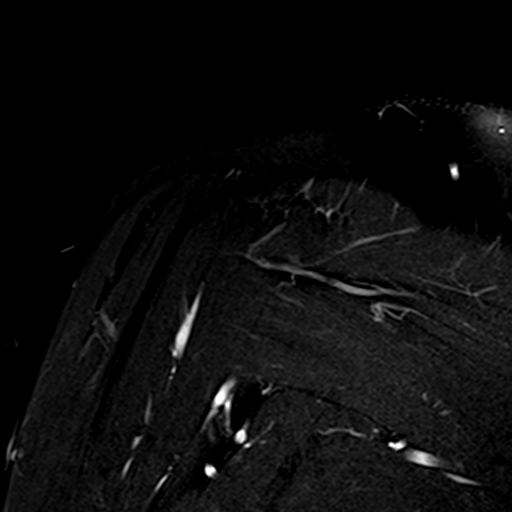

[Series 6: T1 · oblique · 4.0mm · 0.27mm/px · 3 of 18 slices shown]
[im 1/18]
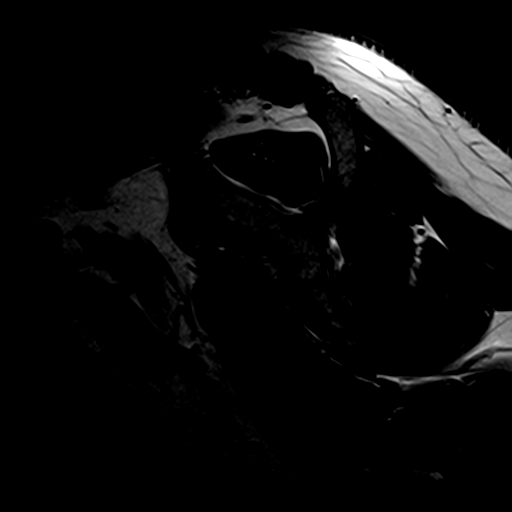
[im 3/18]
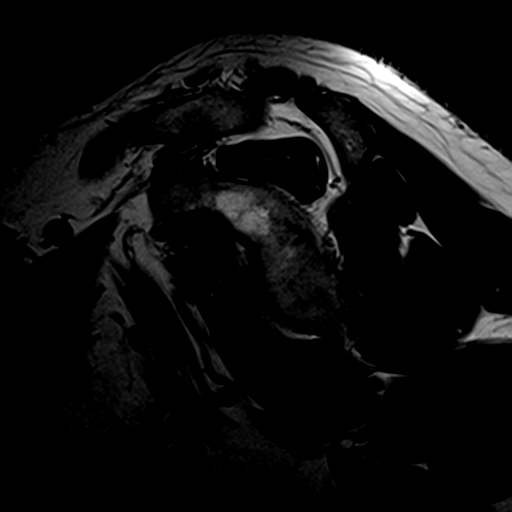
[im 5/18]
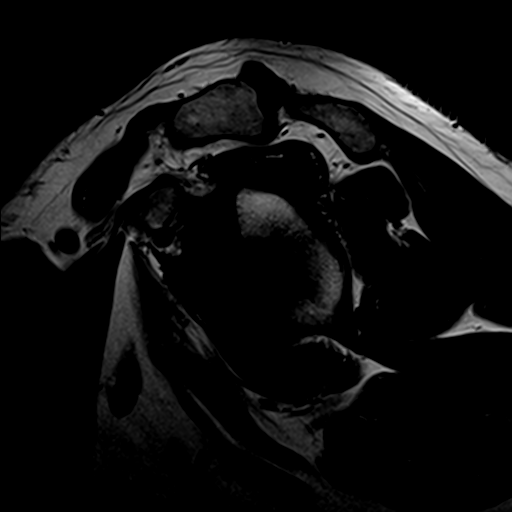

[Series 7: PD · oblique · 4.0mm · 0.55mm/px · 8 of 17 slices shown]
[im 1/17]
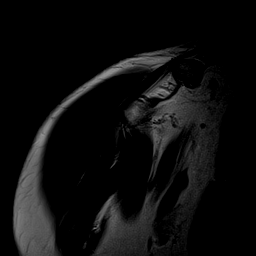
[im 3/17]
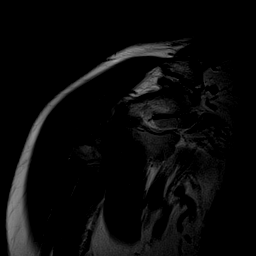
[im 5/17]
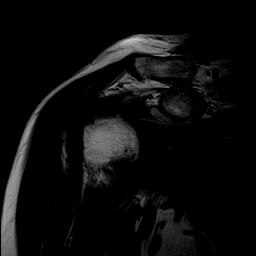
[im 7/17]
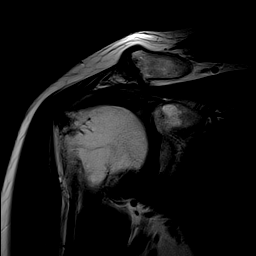
[im 10/17]
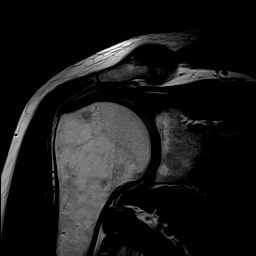
[im 12/17]
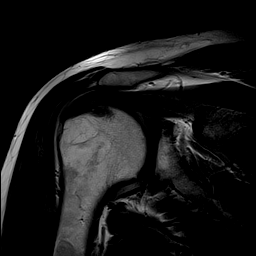
[im 14/17]
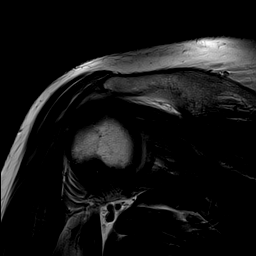
[im 17/17]
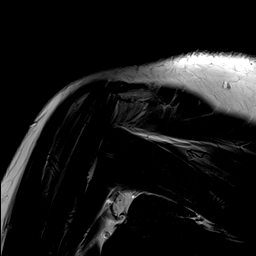

[35 of 40 positions shown; findings below may reference images not displayed]

FINDINGS: Rotator cuff: Full-thickness retracted supraspinatus tendon tear.
Retraction is approximately 10 mm. The infraspinatus tendon
demonstrates moderate articular surface tearing and the
subscapularis tendon also demonstrates areas of articular surface
tearing.

Muscles:  Normal.

Biceps long head:  Torn and retracted.

Acromioclavicular Joint: Moderate to advanced AC joint degenerative
changes. The acromion is type 1-2 in shape. No significant lateral
downsloping.

Glenohumeral Joint: Mild degenerative changes. Small joint effusion
and mild synovitis.

Labrum: The superior labrum is degenerated and torn. The anterior
and posterior labrum are grossly normal.

Bones: Metaphyseal signal abnormality in the humerus and glenoid
likely due to smoking, anemia, obesity or osteoporosis. Subchondral
cystic change noted in the humeral head.
IMPRESSION: 1. Full-thickness retracted supraspinatus tendon tear.
2. Partial thickness articular surface tears involving the
infraspinatus and subscapularis tendons.
3. Torn and retracted long head biceps tendon.
4. Degenerated and torn superior labrum.
5. Moderate to advanced AC joint degenerative changes may contribute
to bony impingement.

## 2016-08-18 ENCOUNTER — Telehealth: Payer: Self-pay

## 2016-08-18 NOTE — Telephone Encounter (Signed)
I called pt and reminded him to bring his cpap to the appt with Dr. Rexene Alberts on Monday, and reminded him of his appt date and time. Pt verbalized understanding.

## 2016-08-22 ENCOUNTER — Ambulatory Visit (INDEPENDENT_AMBULATORY_CARE_PROVIDER_SITE_OTHER): Payer: Medicare Other | Admitting: Neurology

## 2016-08-22 ENCOUNTER — Encounter: Payer: Self-pay | Admitting: Neurology

## 2016-08-22 VITALS — BP 138/82 | HR 77 | Ht 67.0 in | Wt 210.0 lb

## 2016-08-22 DIAGNOSIS — G4719 Other hypersomnia: Secondary | ICD-10-CM | POA: Diagnosis not present

## 2016-08-22 DIAGNOSIS — R400 Somnolence: Secondary | ICD-10-CM

## 2016-08-22 DIAGNOSIS — G4733 Obstructive sleep apnea (adult) (pediatric): Secondary | ICD-10-CM | POA: Diagnosis not present

## 2016-08-22 DIAGNOSIS — Z9989 Dependence on other enabling machines and devices: Secondary | ICD-10-CM

## 2016-08-22 MED ORDER — MODAFINIL 100 MG PO TABS: 100.0000 mg | ORAL_TABLET | Freq: Two times a day (BID) | ORAL | 3 refills | Status: DC

## 2016-08-22 NOTE — Patient Instructions (Addendum)
Your sleepiness is likely in part due to not sleeping enough at night, taking medications that could be sedating.  Please make enough time for sleep, 7-8 hours at night. Your average usage on the CPAP is 6 h and 24 min.  Please continue using your CPAP regularly. While your insurance requires that you use CPAP at least 4 hours each night on 70% of the nights, I recommend, that you not skip any nights and use it throughout the night if you can. Getting used to CPAP and staying with the treatment long term does take time and patience and discipline. Untreated obstructive sleep apnea when it is moderate to severe can have an adverse impact on cardiovascular health and raise her risk for heart disease, arrhythmias, hypertension, congestive heart failure, stroke and diabetes. Untreated obstructive sleep apnea causes sleep disruption, nonrestorative sleep, and sleep deprivation. This can have an impact on your day to day functioning and cause daytime sleepiness and impairment of cognitive function, memory loss, mood disturbance, and problems focussing. Using CPAP regularly can improve these symptoms.  Follow up with Megan in about 6 months.   We will continue with provigil, you can take 2 pills in AM or 1 pill 2 times a day.

## 2016-08-22 NOTE — Progress Notes (Signed)
Subjective:    Patient ID: Gabriel [name] is a 71 y.o. male.  HPI     Interim history:   Gabriel [name] is a very friendly 71 year old left-handed woman with an underlying medical history of arthritis of the left shoulder, degenerative disc disease, cervical radiculopathy, depression with anxiety, reflux disease, obstructive sleep apnea, who presents for follow-up consultation of his OSA on CPAP with residual EDS. He is accompanied by his wife today. I last saw him on 08/18/2015, at which time he had significant arthritic type pain in both shoulders, right knee, increase in stress. His mother was in a nursing home. 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 He was compliant with CPAP.  Saw Gabriel [name] on 02/22/16.   Today, 08/22/2016 (all dictated new, as well as above notes, some dictation done in note pad or Word, outside of chart, may appear as copied):  I reviewed his CPAP compliance data from 07/23/2016 through 08/21/2016, which is a total of 30 days, during which time he used his machine every night with percent used days greater than 4 hours at 100%, indicating superb compliance, average usage of 6 hours and 24 minutes, residual AHI 0.6 per hour, leak high with the 95th percentile at 30.8 L/m on a pressure of 11 cm with EPR of 2. He reports traveling pain in different areas. Sadly, his mother passed away in Dec 23, 2015. There is ongoing stress at home. He has sleep disruption at night, often tending to his wife. Has had sleepiness during the day. He admits that he does not get a good night sleep. He is not sure if the second dose of Provigil has been helpful.  The patient's allergies, current medications, family history, past medical history, past social history, past surgical history and problem list were reviewed and updated as appropriate.   Previously (copied from previous notes for reference):   I saw him on 01/06/2015, at which time he reported feeling fairly  stable. He was using his CPAP regularly. He needed new supplies. He had ongoing stress. He was planning to retire soon. He was on generic Provigil 100 mg once or twice daily.    I reviewed his CPAP compliance data from 07/17/2015 2 08/15/2015 which is a total of 30 days during which time he used his machine every night with percent used days greater than 4 hours at 97%, indicating excellent compliance with an average usage of 6 hours and 29 minutes, residual AHI 1.4 per hour, leak at times high with the 95th percentile at 25.4 L/m on a pressure of 11 cm with EPR 2.   Of note, the patient no showed for an appointment on 12/24/2014 secondary to sickness. I saw him on 06/19/2014, at which time he reported compliance with treatment. He was on 5 mg of Ambien each night and 5 mg of melatonin each night. Modafinil was marginally helpful. He usually takes 1 in the morning and sometimes a second one in the afternoon. 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 He reported bilateral shoulder pain. He had to do a lot of lifting at home. His wife also needs help at night. This disrupts his sleep.    I reviewed his CPAP compliance data from 11/23/2014 through 12/22/2014 which is a total of 30 days during which time he used his machine every night with percent used days greater than 4 hours at 97%, indicating excellent compliance  with an average usage of 6 hours and 8 minutes, residual AHI low at 1.1 per hour, leak acceptable with the 95th percentile at 16.2 L/m on a pressure of 11 cm with EPR of 2.   I saw him on 12/18/2013, at which time he reported having trouble sleeping at night. He felt sleepy during the day. He was on Provigil 100 mg once daily. He had more stress and more back pain. He was worried about his wife's health.   I reviewed his CPAP compliance data from 05/18/2014 through 06/16/2014 which is a total of 30 days during which time he used his  machine every night with percent used days greater than 4 hours at 100%, indicating superb compliance with an average usage of 6 hours and 33 minutes, residual AHI low at 1.2 per hour, leak however high, with the 95th percentile at 40.2 L/m on a pressure of 11 cm with EPR of 2.    I saw him on 07/02/2013, at which time I suggested he return for a full night CPAP study followed by a nap study the next day. He was advised to taper off his nortriptyline and Robaxin as well as stop his Provigil 7-10 days prior to sleep testing. He had a CPAP study on 08/27/2013. Sleep efficiency was 86.9%. Latency to sleep was 3.5 minutes. Wake after sleep onset was 55.5 minutes with moderate to severe sleep fragmentation noted. He had an elevated arousal index at 15.2 per hour. He had an increased percentage of stage 1 sleep and a mildly increased percentage of REM sleep at 26.1% with a mildly prolonged REM latency of 130.5 minutes. He had frequent PVCs. He was on CPAP at the usual pressure of 10 cm which was then increased to 11 and 12 cm. I felt he did a little bit better on 11 cm and since it was not a big change from his baseline treatment pressure we proceeded with the next day nap study. He had an MSLT on 08/28/2013 with 5. Mean sleep latency was 10.4 with no REM onset naps. We called him with his test results on the phone. I did make an adjustment to his CPAP pressure to 11 cm.   I reviewed his compliance data from 11/16/2013 through 12/15/2013 which is a total of 30 days during which time he used his CPAP every night with percent used days greater than 4 hours of 97%, indicating excellent compliance, average usage of 6 hours and 17 minutes. Residual AHI at 1.2 per hour, leak at times was high with the 95th percentile at 47.3 L/m. Pressure at 11 cm with EPR of 2.   I saw him on 01/14/13, at which time we talked about his sleep study results and his CPAP   compliance which was good. He was well treated in terms of reduction  of his residual AHI. I suggested he try Nuvigil as he had some side effects in the past with Provigil. We also talked about potentially bringing him back for further testing with MSLT down the [place]. In the interim, he was seen by our NP, Gabriel [name], on 04/17/2013, at which time he reported that he could not afford Nuvigil and we decided to prescribe Provigil again. He requested to try it again. We also scheduled him for a repeat sleep study followed by a nap study to further delineate and quantify his sleepiness. I asked him to taper off sedating medications with the help of his primary care physician and also discontinue his  Provigil 7-10 days prior to the sleep studies. He was given instructions by his primary care physician to come off of his muscle relaxer and nortriptyline. He did not have his sleep studies done and called to cancel those. I reviewed his CPAP compliance down from 03/18/2013 through 04/16/2013 which is a total of 30 days during which time he uses CPAP every night with 80% used days greater than 4 hours at 100%, indicating superb compliance with an average usage of 7 hours. Residual AHI was acceptable at 2.7 per hour and leak was acceptable at 20.1 L per minute at the 95th percentile.   I reviewed his compliance data from 06/02/2013 through 07/01/2013 which is the last 30 days during which time he was 100% compliant. Average usage was 6 hours and 39 minutes. Residual AHI low at 1 per hour, leak was a time high with the 95th percentile at 34.7 L per minute, pressure at 10 cm with EPR of 2.    I first met him on 10/16/12, at which time, he reported problems initiating and maintaining sleep and ongoing severe EDS. He has been taking Pamelor at bedtime (previously Elavil, but had to stop because of severe mouth dryness) as well as Ambien 5 mg at bedtime and has been on it for about 25 years. I recommended re-evaluation of his OSA first with a split night sleep study. He has this test on 11/21/2012 and  I went over his sleep study report in detail with him previously: His baseline sleep efficiency was reduced at 60.5% with a latency to sleep of 14 minutes and wake after sleep onset of 36 minutes with moderate to severe sleep fragmentation noted. He had a markedly elevated arousal index at 96.5 per hour. There was a markedly increased percentage of stage I sleep, reduced percentage of stage II sleep, and absence of slow-wave sleep and REM sleep prior to CPAP. He had periodic leg movements of 5.5 per hour. He had frequent, unifocal-appearing PVCs. He had mild to moderate snoring. His AHI was highly elevated at 83.9 per hour. His baseline oxygen saturation was 93%, his nadir was 84% in non-REM sleep. He was then titrated on CPAP. His sleep efficiency was significantly increased and his arousal index improved. He had absence of slow wave sleep and 12.7% of REM sleep. His average oxygen saturation was 93%, his nadir was 82%. He was titrated from 5-11 cm of water pressure and his AHI was reduced to 0 events per hour at the pressure of 10. I also reviewed his compliance data from his machine: From 12/12/2012 through 01/13/2013 which is a total of 33 days during which she used CPAP every day. His average usage was 6 hours and 44 minutes. His residual AHI was 1.7 per hour on the pressure of 10 cm with EPR of 2. He had a fairly high leak at times however.   He is feeling better than before the sleep study. He is compliant with treatment, likes the machine better and the mask as well. She feels, his sleepiness is about the same, but he feels better. He may take a nap without CPAP. He does not keep a very good sleep schedule. He uses the bathroom about once per night.   He was previously diagnosed with OSA in 1998 and was prescribed CPAP of 12 cm it indicated full compliance. I reviewed his CPAP titration study from 01/24/2010 last time. His AHI was reduced to 0 per hour and O2 nadir was 91% for the  night. REM latency of 49  minutes, but this was a titration study, and could have shown REM rebound.   He was tried on Provigil years ago, and it did not help and he felt nervous and irritable on it. He denies cataplexy, or hypogogic or hypnopompic hallucinations, but has had sleep paralysis. He had an MSLT about 10 years ago, which showed no evidence of narcolepsy per patient.    His Past Medical History Is Significant For: Past Medical History:  Diagnosis Date  . BPH (benign prostatic hypertrophy)    h/o prostate nodule, followed by Dr. Risa Grill yearly  . Cervical neck pain with evidence of disc disease    h/o bulging discs  . Chronic lower back pain    DDD, prior on lyrica, gabapentin, lumbar stenosis with persistent numbness R ant thigh, ESI didn't help, now seeing [facility]  . Depression with anxiety    h/o remote hospitalization for depression  . GERD (gastroesophageal reflux disease)   . Hematuria - cause not known    s/p urology workup 2010, nl CT, cystoscopy (doesn't want rpt), cytology  . History of chicken pox   . History of colon polyps 2005   tubular adenoma, on rpt no more (2010)  . Insomnia    [place]  . OSA on CPAP    cpap machine at 10cm, ambien, daytime drowsiness attributed to OSA, last changed dextroamphetamine to nuvigil, previously tried provigil  . Prostate nodule    followed by Risa Grill, reassuring exam and PSA  . Seronegative arthritis    RA, previously on MTX, RF <7, ANA nl (rheum Dr. Donnamarie Poag)  . Torn rotator cuff    bilateral, s/p L repair and now recurrent, chronic massive R RTC tear (supraspinatus)    His Past Surgical History Is Significant For: Past Surgical History:  Procedure Laterality Date  . ACHILLES TENDON REPAIR  2002  . CATARACT EXTRACTION  2007   left  . COLONOSCOPY  11/2008   diverticulosis, rpt 5 yrs  . COLONOSCOPY  07/2014   2 TA, mild diverticulosis, rpt 5 yrs [name])  . DOBUTAMINE STRESS ECHO  08/2006   negative for ischemia  . LUMBAR DISC SURGERY  07/2010   L3/4  diskectomy (Dr. Harl Bowie at [facility])  . MRI thoracic  09/2010   thoracic DDD/kyphosis, minimal anterolisthesis T2/3 with B foraminal narrowing  . ROTATOR CUFF REPAIR  2008   Left (Dr. Gaylyn Rong in [place], now [place])  . UPPER GASTROINTESTINAL ENDOSCOPY  02/2005   minimal GERD, small gastric polyp, tiny HH    His Family History Is Significant For: Family History  Problem Relation Age of Onset  . Arthritis Mother   . COPD Father        smoker  . Cancer Neg Hx   . Stroke Neg Hx   . Coronary artery disease Neg Hx   . Diabetes Neg Hx     His Social History Is Significant For: Social History   Social History  . Marital status: Married    Spouse name: Gabriel [name]  . Number of children: 2  . Years of education: Masters +   Occupational History  . Clergyman Other    [employer]   Social History Main Topics  . Smoking status: Never Smoker  . Smokeless tobacco: Never Used  . Alcohol use No  . Drug use: No  . Sexual activity: Yes   Other Topics Concern  . None   Social History Narrative   Caffeine: avoids   Patient is  married Gabriel [name]) Lives with wife - who is in a power chair.   Occupation: Scientist, product/process development, Oceanographer   Edu: masters +   Likes to travel   Activity: no regular activity, to join Y   Diet: some fruits/vegetables   Patient has two children.   Patient is left-handed.    His Allergies Are:  Allergies  Allergen Reactions  . Amoxicillin Nausea And Vomiting  . Azithromycin Other (See Comments)    Wide spread body aches  . Doxycycline Other (See Comments)    "don't like how it makes me feel" ?nausea  . Erythromycin Nausea And Vomiting  . Sulfa Drugs Cross Reactors Other (See Comments)    [place]  :   His Current Medications Are:  Outpatient Encounter Prescriptions as of 08/22/2016  Medication Sig  . acetaminophen (TYLENOL) 500 MG tablet Take 500 mg by mouth at bedtime.   Marland Kitchen buPROPion (WELLBUTRIN XL) 300 MG 24 hr tablet TAKE 1 TABLET DAILY  .  HYDROcodone-acetaminophen (NORCO/VICODIN) 5-325 MG tablet Take 1 tablet by mouth as needed for moderate pain.  Marland Kitchen loratadine (CLARITIN) 10 MG tablet Take 10 mg by mouth daily as needed for allergies.  . Melatonin 5 MG CAPS Take 5 mg by mouth at bedtime.   . methocarbamol (ROBAXIN) 750 MG tablet Take 1 tablet (750 mg total) by mouth 3 (three) times daily as needed. (Patient taking differently: Take 750 mg by mouth at bedtime. )  . modafinil (PROVIGIL) 100 MG tablet Take 1 tablet (100 mg total) by mouth 2 (two) times daily.  Marland Kitchen omeprazole (PRILOSEC) 20 MG capsule TAKE 1 CAPSULE DAILY  . psyllium (REGULOID) 0.52 g capsule Take 0.52 g by mouth daily as needed.  . tamsulosin (FLOMAX) 0.4 MG CAPS capsule Take 0.4 mg by mouth at bedtime.  . vitamin B-12 (CYANOCOBALAMIN) 1000 MCG tablet Take 1,000 mcg by mouth daily.  Marland Kitchen zolpidem (AMBIEN) 5 MG tablet Take 1 tablet (5 mg total) by mouth at bedtime as needed for sleep.  . [DISCONTINUED] meloxicam (MOBIC) 7.5 MG tablet Take 1 tablet (7.5 mg total) by mouth daily.   No facility-administered encounter medications on file as of 08/22/2016.   :  Review of Systems:  Out of a complete 14 point review of systems, all are reviewed and negative with the exception of these symptoms as listed below: Review of Systems  Neurological:       Pt presents today to discuss his cpap compliance. Pt says that he switched to a nasal mask and is still getting used to it. Pt says that he still feels tired during the day.    Objective:  Neurological Exam  Physical Exam Physical Examination:   Vitals:   08/22/16 0941  BP: 138/82  Pulse: 77   General Examination: The patient is a very pleasant 71 y.o. male in no acute distress. He appears well-developed and well-nourished and well groomed.   HEENT: Normocephalic, atraumatic, pupils are equal, round and reactive to light and accommodation. Extraocular tracking is good without limitation to gaze excursion or nystagmus noted.  Normal smooth pursuit is noted. Hearing is grossly intact. Face is symmetric with normal facial animation and normal facial sensation. Speech is clear with no dysarthria noted. Some pressured sleep. There is no hypophonia. There is no lip, neck/head, jaw or voice tremor. Neck is supple with full range of passive and active motion. There are no carotid bruits on auscultation. Oropharynx exam reveals: mild mouth dryness, adequate dental hygiene and moderate airway crowding, due to  narrow airway entry and larger tongue. Mallampati is class III. Tongue protrudes centrally and palate elevates symmetrically. Tonsils are 1+.    Chest: Clear to auscultation without wheezing, rhonchi or crackles noted.  Heart: S1+S2+0, regular and normal without murmurs, rubs or gallops noted.   Abdomen: Soft, non-tender and non-distended with normal bowel sounds appreciated on auscultation.  Extremities: There is no pitting edema in the distal lower extremities bilaterally. Pedal pulses are intact.  Skin: Warm and dry without trophic changes noted. There are mild varicose veins in the right distal leg, stable.  Musculoskeletal: exam reveals: pain and some decrease in range of motion in both shoulders, unchanged. Unremarkable scar R knee.   Neurologically:  Mental status: The patient is awake, alert and oriented in all 4 spheres. His memory, attention, language and knowledge are appropriate. There is no aphasia, agnosia, apraxia or anomia. Thought process: needs some redirection. Mood is congruent and affect is normal.  Cranial nerves are as described above under HEENT exam. In addition, shoulder shrug is normal with equal shoulder height noted. Motor exam: Normal bulk, strength and tone is noted. There is no drift, tremor or rebound. Romberg is negative, except for slight sway. Reflexes are 2+ throughout. Fine motor skills are intact with normal finger taps, normal hand movements, normal rapid alternating patting,  normal foot taps and normal foot agility.  Cerebellar testing shows no dysmetria or intention tremor on finger to nose testing. Heel to shin is unremarkable bilaterally. There is no truncal or gait ataxia.  Sensory exam is intact to light touch in the upper and lower extremities.  Gait, station and balance are unremarkable. No veering to one side is noted. No leaning to one side is noted. Posture is age-appropriate and stance is narrow based. No problems turning are noted. He turns en bloc. Tandem walk is slightly difficult for him today. No limp.         Assessment and Plan:   In summary, DADEN MAHANY is a very pleasant 71 year old male with an underlying medical history of arthritis of the left shoulder, degenerative disc disease, cervical radiculopathy, depression with anxiety, and reflux disease, who presents for follow up consultation of his obstructive sleep apnea with residual EDS. He is on CPAP with full compliance and has always been fully compliant with treatment. He is commended for this. Provigil has helped his daytime somnolence to some degree, but there are other factors at play, which I explained again to him today. Besides physical ailments, he has a lot of stress, anxiety and depression, some sedating meds on his list, and he does not get enough sleep at night. He is advised to make enough time for sleep and not to drive when sleepy. I renewed his prescription for generic modafinil twice daily today. He is advised that he could try taking both pills, 200 mg in the morning together. Physical exam is stable. He has contemplated retirement but feels that working is part of his outlet besides having to take care of his wife. He feels that he needs that physical and mental outlet. He had interim R knee arthroscopic surgery and doing okay in that regard. I suggested a 6 month follow-up, he can see one of our nurse practitioners. I will see him back after that. I answered all his questions  today and the patient was in agreement.  I spent 25 minutes in total face-to-face time with the patient, more than 50% of which was spent in counseling and coordination of  care, reviewing test results, reviewing medication and discussing or reviewing the diagnosis of sleepiness, and OSA, the prognosis and treatment options.

## 2016-08-23 ENCOUNTER — Encounter: Payer: Self-pay | Admitting: Family Medicine

## 2016-09-29 ENCOUNTER — Other Ambulatory Visit: Payer: Self-pay | Admitting: Family Medicine

## 2016-11-02 DIAGNOSIS — S83241D Other tear of medial meniscus, current injury, right knee, subsequent encounter: Secondary | ICD-10-CM | POA: Diagnosis not present

## 2016-11-09 DIAGNOSIS — H2511 Age-related nuclear cataract, right eye: Secondary | ICD-10-CM | POA: Diagnosis not present

## 2016-11-19 DIAGNOSIS — Z23 Encounter for immunization: Secondary | ICD-10-CM | POA: Diagnosis not present

## 2016-12-05 DIAGNOSIS — M542 Cervicalgia: Secondary | ICD-10-CM | POA: Diagnosis not present

## 2016-12-06 ENCOUNTER — Ambulatory Visit (INDEPENDENT_AMBULATORY_CARE_PROVIDER_SITE_OTHER): Payer: Medicare Other | Admitting: Family Medicine

## 2016-12-06 ENCOUNTER — Encounter: Payer: Self-pay | Admitting: Family Medicine

## 2016-12-06 VITALS — BP 124/80 | HR 70 | Temp 98.0°F | Wt 213.0 lb

## 2016-12-06 DIAGNOSIS — G8929 Other chronic pain: Secondary | ICD-10-CM | POA: Diagnosis not present

## 2016-12-06 DIAGNOSIS — R202 Paresthesia of skin: Secondary | ICD-10-CM

## 2016-12-06 DIAGNOSIS — G4733 Obstructive sleep apnea (adult) (pediatric): Secondary | ICD-10-CM | POA: Diagnosis not present

## 2016-12-06 DIAGNOSIS — G5603 Carpal tunnel syndrome, bilateral upper limbs: Secondary | ICD-10-CM | POA: Insufficient documentation

## 2016-12-06 DIAGNOSIS — G4710 Hypersomnia, unspecified: Secondary | ICD-10-CM

## 2016-12-06 DIAGNOSIS — M25561 Pain in right knee: Secondary | ICD-10-CM

## 2016-12-06 DIAGNOSIS — G4700 Insomnia, unspecified: Secondary | ICD-10-CM

## 2016-12-06 NOTE — Assessment & Plan Note (Signed)
He takes provigil 200mg  twice daily prescribed by neurology.

## 2016-12-06 NOTE — Assessment & Plan Note (Signed)
Did not respond to steroid course, did not tolerate gabapentin. Pending NCS ordered by ortho.

## 2016-12-06 NOTE — Progress Notes (Signed)
BP 124/80 (BP Location: Left Arm, Patient Position: Sitting, Cuff Size: Normal)   Pulse 70   Temp 98 F (36.7 C) (Oral)   Wt 213 lb (96.6 kg)   SpO2 97%   BMI 33.36 kg/m    CC: 6 mo f/u visit Subjective:    Patient ID: Gabriel [name], male    DOB: 07-Jul-1945, 71 y.o.   MRN: [id number]  HPI: Gabriel [name] is a 71 y.o. male presenting on 12/06/2016 for 6 mo follow-up   Looking into getting assisted living for wife and moving into apartment himself.   Persistent swelling and paresthesias of bilateral hands. Saw Dr Mardelle Matte - treated with dose pack without significant improvement and gabapentin (did not tolerate). Pending NCS. Possible cervical radiculopathy vs CTS.   Ongoing OSA on CPAP and provigil.   S/p arthroscopy with meniscal repair/loose body removal ([place]), undergoing physical therapy.   Relevant past medical, surgical, family and social history reviewed and updated as indicated. Interim medical history since our last visit reviewed. Allergies and medications reviewed and updated. Outpatient Medications Prior to Visit  Medication Sig Dispense Refill  . acetaminophen (TYLENOL) 500 MG tablet Take 500 mg by mouth at bedtime.     Marland Kitchen buPROPion (WELLBUTRIN XL) 300 MG 24 hr tablet TAKE 1 TABLET DAILY 90 tablet 0  . HYDROcodone-acetaminophen (NORCO/VICODIN) 5-325 MG tablet Take 1 tablet by mouth as needed for moderate pain.    Marland Kitchen loratadine (CLARITIN) 10 MG tablet Take 10 mg by mouth daily as needed for allergies.    . Melatonin 5 MG CAPS Take 5 mg by mouth at bedtime.     . modafinil (PROVIGIL) 100 MG tablet Take 1 tablet (100 mg total) by mouth 2 (two) times daily. 180 tablet 3  . omeprazole (PRILOSEC) 20 MG capsule TAKE 1 CAPSULE DAILY 90 capsule 3  . tamsulosin (FLOMAX) 0.4 MG CAPS capsule Take 0.4 mg by mouth at bedtime.    . vitamin B-12 (CYANOCOBALAMIN) 1000 MCG tablet Take 1,000 mcg by mouth daily.    Marland Kitchen zolpidem (AMBIEN) 5 MG tablet Take 1 tablet (5 mg total) by mouth  at bedtime as needed for sleep. 90 tablet 1  . methocarbamol (ROBAXIN) 750 MG tablet Take 1 tablet (750 mg total) by mouth 3 (three) times daily as needed. (Patient taking differently: Take 750 mg by mouth at bedtime. ) 270 tablet 1  . psyllium (REGULOID) 0.52 g capsule Take 0.52 g by mouth daily as needed.     No facility-administered medications prior to visit.      Per HPI unless specifically indicated in ROS section below Review of Systems     Objective:    BP 124/80 (BP Location: Left Arm, Patient Position: Sitting, Cuff Size: Normal)   Pulse 70   Temp 98 F (36.7 C) (Oral)   Wt 213 lb (96.6 kg)   SpO2 97%   BMI 33.36 kg/m   Wt Readings from Last 3 Encounters:  12/06/16 213 lb (96.6 kg)  08/22/16 210 lb (95.3 kg)  08/04/16 213 lb (96.6 kg)    Physical Exam  Constitutional: He appears well-developed and well-nourished. No distress.  HENT:  Mouth/Throat: Oropharynx is clear and moist. No oropharyngeal exudate.  Eyes: Pupils are equal, round, and reactive to light. Conjunctivae are normal.  Cardiovascular: Normal rate, regular rhythm, normal heart sounds and intact distal pulses.   No murmur heard. Pulmonary/Chest: Effort normal and breath sounds normal. No respiratory distress. He has no wheezes. He has no  rales.  Musculoskeletal: He exhibits no edema.  Skin: Skin is warm and dry. No rash noted.  Psychiatric: He has a normal mood and affect.  Nursing note and vitals reviewed.  Results for orders placed or performed in visit on 79/89/21  Basic metabolic panel  Result Value Ref Range   Sodium 139 135 - 145 mEq/L   Potassium 4.2 3.5 - 5.1 mEq/L   Chloride 104 96 - 112 mEq/L   CO2 28 19 - 32 mEq/L   Glucose, Bld 89 70 - 99 mg/dL   BUN 18 6 - 23 mg/dL   Creatinine, Ser 1.15 0.40 - 1.50 mg/dL   Calcium 9.2 8.4 - 10.5 mg/dL   GFR 67.11 >60.00 mL/min  Vitamin B12  Result Value Ref Range   Vitamin B-12 740 211 - 911 pg/mL      Assessment & Plan:   Problem List  Items Addressed This Visit    Hypersomnolence    He takes provigil 200mg  twice daily prescribed by neurology.       Insomnia    Chronically managed with ambien 5mg , melatonin. He also takes robaxin at night.       OSA (obstructive sleep apnea)    Appreciate neuro care of patient.       Paresthesia of both hands - Primary    Did not respond to steroid course, did not tolerate gabapentin. Pending NCS ordered by ortho.       Right knee pain    S/p arthroscopy earlier this year, overall improved but with persistent pain. Discussed glucosamine trial.          Follow up plan: Return in about 6 months (around 06/06/2017) for medicare wellness visit, follow up visit.  Ria Bush, MD

## 2016-12-06 NOTE — Assessment & Plan Note (Addendum)
S/p arthroscopy earlier this year, overall improved but with persistent pain. Discussed glucosamine trial.

## 2016-12-06 NOTE — Patient Instructions (Addendum)
You are doing well today Ok to try glucosamine over the counter.  Return as needed or in 6 months for wellness visit with Gabriel [name] and follow up with me

## 2016-12-06 NOTE — Assessment & Plan Note (Signed)
Chronically managed with ambien 5mg , melatonin. He also takes robaxin at night.

## 2016-12-06 NOTE — Assessment & Plan Note (Signed)
Appreciate neuro care of patient.

## 2016-12-13 ENCOUNTER — Other Ambulatory Visit: Payer: Self-pay | Admitting: *Deleted

## 2016-12-13 ENCOUNTER — Encounter: Payer: Self-pay | Admitting: Neurology

## 2016-12-13 DIAGNOSIS — R200 Anesthesia of skin: Secondary | ICD-10-CM

## 2017-01-03 ENCOUNTER — Ambulatory Visit (INDEPENDENT_AMBULATORY_CARE_PROVIDER_SITE_OTHER): Payer: Medicare Other | Admitting: Neurology

## 2017-01-03 DIAGNOSIS — G5603 Carpal tunnel syndrome, bilateral upper limbs: Secondary | ICD-10-CM

## 2017-01-03 DIAGNOSIS — R200 Anesthesia of skin: Secondary | ICD-10-CM | POA: Diagnosis not present

## 2017-01-03 NOTE — Procedures (Signed)
[facility]  [place], [place]  [place], [place] [address] Tel: [phone] Fax:  [phone] Test Date:  01/03/2017  Patient: Gabriel [name] DOB: [birth year] Physician: Narda Amber, DO  Sex: Male Height: 5\' 7"  Ref Phys: Marchia Bond, MD  ID#: [id number] Temp: 35.1C Technician:    Patient Complaints: This is a 71 year-old man referred for evaluation of bilateral hand pain, numbness, and weakness.   NCV & EMG Findings: Extensive electrodiagnostic testing of the right upper extremity and additional studies of the left shows:  1. Bilateral median sensory responses show reduced amplitude and bilateral mixed palmar sensory responses show prolonged latency. Bilateral ulnar sensory responses are within normal limits. 2. Right median motor response shows mildly prolonged latency with a positive deflection in the waveform; these findings in combination with a motor response when stimulating at the ulnar-wrist are consistent with a Martin-Gruber anastomosis on the right, a normal variant. Bilateral ulnar and left median motor responses are within normal limits. 3. Chronic motor axon loss changes are seen affecting the C5-C6 myotomes on the left, without accompanied active denervation. These findings are not present in the right upper extremity.  Impression: 1. Bilateral median neuropathy at or distal to the wrist, consistent with clinical diagnosis of carpal tunnel syndrome. Overall, these findings are mild-to-moderate in degree electrically. 2. Chronic C5-C6 radiculopathy affecting the left upper extremity, mild in degree electrically. 3. Incidentally, there is a right Martin-Gruber anastomosis, a normal variant.    ___________________________ Narda Amber, DO    Nerve Conduction Studies Anti Sensory Summary Table   Stim Site NR Peak (ms) Norm Peak (ms) P-T Amp (V) Norm P-T Amp  Left Median Anti Sensory (2nd Digit)  35.1C  Wrist    3.6 <3.8 8.7 >10  Right Median  Anti Sensory (2nd Digit)  35.1C  Wrist    3.4 <3.8 6.4 >10  Left Ulnar Anti Sensory (5th Digit)  35.1C  Wrist    2.8 <3.2 16.1 >5  Right Ulnar Anti Sensory (5th Digit)  35.1C  Wrist    2.6 <3.2 14.3 >5   Motor Summary Table   Stim Site NR Onset (ms) Norm Onset (ms) O-P Amp (mV) Norm O-P Amp Site1 Site2 Delta-0 (ms) Dist (cm) Vel (m/s) Norm Vel (m/s)  Left Median Motor (Abd Poll Brev)  35.1C  Wrist    3.4 <4.0 8.8 >5 Elbow Wrist 5.6 31.0 55 >50  Elbow    9.0  8.3         Right Median Motor (Abd Poll Brev)  35.1C  Wrist    4.1 <4.0 9.6 >5 Elbow Wrist 6.8 34.0 50 >50  Elbow    10.9  9.5  Ulnar-wrist crossover Elbow 6.6 0.0    Ulnar-wrist crossover    4.3  5.2         Left Ulnar Motor (Abd Dig Minimi)  35.1C  Wrist    3.1 <3.1 12.6 >7 B Elbow Wrist 4.2 27.0 64 >50  B Elbow    7.3  11.7  A Elbow B Elbow 1.7 10.0 59 >50  A Elbow    9.0  11.4         Right Ulnar Motor (Abd Dig Minimi)  35.1C  Wrist    3.0 <3.1 10.7 >7 B Elbow Wrist 4.3 26.0 60 >50  B Elbow    7.3  9.2  A Elbow B Elbow 1.8 10.0 56 >50  A Elbow    9.1  8.7  Comparison Summary Table   Stim Site NR Peak (ms) Norm Peak (ms) P-T Amp (V) Site1 Site2 Delta-P (ms) Norm Delta (ms)  Left Median/Ulnar Palm Comparison (Wrist - 8cm)  35.1C  Median Palm    2.4 <2.2 19.4 Median Palm Ulnar Palm 0.9   Ulnar Palm    1.5 <2.2 20.7      Right Median/Ulnar Palm Comparison (Wrist - 8cm)  35.1C  Median Palm    2.6 <2.2 19.3 Median Palm Ulnar Palm 1.2   Ulnar Palm    1.4 <2.2 7.6       EMG   Side Muscle Ins Act Fibs Psw Fasc Number Recrt Dur Dur. Amp Amp. Poly Poly. Comment  Left 1stDorInt Nml Nml Nml Nml Nml Nml Nml Nml Nml Nml Nml Nml N/A  Left Abd Poll Brev Nml Nml Nml Nml Nml Nml Nml Nml Nml Nml Nml Nml N/A  Left PronatorTeres Nml Nml Nml Nml 1- Rapid Few 1+ Few 1+ Nml Nml N/A  Left Biceps Nml Nml Nml Nml 1- Rapid Few 1+ Few 1+ Nml Nml N/A  Left Triceps Nml Nml Nml Nml Nml Nml Nml Nml Nml Nml Nml Nml N/A  Left  Deltoid Nml Nml Nml Nml 1- Rapid Few 1+ Few 1+ Nml Nml N/A  Right 1stDorInt Nml Nml Nml Nml Nml Nml Nml Nml Nml Nml Nml Nml N/A  Right Abd Poll Brev Nml Nml Nml Nml Nml Nml Nml Nml Nml Nml Nml Nml N/A  Right PronatorTeres Nml Nml Nml Nml Nml Nml Nml Nml Nml Nml Nml Nml N/A  Right Biceps Nml Nml Nml Nml Nml Nml Nml Nml Nml Nml Nml Nml N/A  Right Triceps Nml Nml Nml Nml Nml Nml Nml Nml Nml Nml Nml Nml N/A  Right Deltoid Nml Nml Nml Nml Nml Nml Nml Nml Nml Nml Nml Nml N/A      Waveforms:

## 2017-01-06 ENCOUNTER — Telehealth: Payer: Self-pay | Admitting: Neurology

## 2017-01-06 ENCOUNTER — Other Ambulatory Visit: Payer: Self-pay

## 2017-01-06 ENCOUNTER — Other Ambulatory Visit: Payer: Self-pay | Admitting: Family Medicine

## 2017-01-06 MED ORDER — ZOLPIDEM TARTRATE 5 MG PO TABS: 5.0000 mg | ORAL_TABLET | Freq: Every evening | ORAL | 1 refills | Status: DC | PRN

## 2017-01-06 NOTE — Telephone Encounter (Signed)
plz phone in. 

## 2017-01-06 NOTE — Telephone Encounter (Signed)
Patient called back regarding results not received yet at Raliegh Ip 's  Office. The fax # is [phone]. Thanks

## 2017-01-06 NOTE — Telephone Encounter (Signed)
Patient called back with the Fax number [phone] for [employer]. He said they still have not received the results.  Thanks

## 2017-01-06 NOTE — Telephone Encounter (Signed)
Please send emg results to Dr Luanna Cole office, emg done on 01/03/2017, pt is wanting the results

## 2017-01-06 NOTE — Telephone Encounter (Signed)
I have faxed the results x 2.  Will fax again.

## 2017-01-06 NOTE — Telephone Encounter (Signed)
Last printed:  07/19/16, #90 Last OV:  12/06/16 Next OV:  none

## 2017-01-08 ENCOUNTER — Encounter: Payer: Self-pay | Admitting: Family Medicine

## 2017-01-08 MED ORDER — ZOLPIDEM TARTRATE 5 MG PO TABS: 5.0000 mg | ORAL_TABLET | Freq: Every evening | ORAL | 1 refills | Status: AC | PRN

## 2017-01-08 NOTE — Telephone Encounter (Signed)
sent electronically

## 2017-01-08 NOTE — Addendum Note (Signed)
Addended by: Ria Bush on: 01/08/2017 05:20 PM   Modules accepted: Orders

## 2017-01-09 NOTE — Telephone Encounter (Signed)
Sent to [employer] x 4.

## 2017-01-11 DIAGNOSIS — G5603 Carpal tunnel syndrome, bilateral upper limbs: Secondary | ICD-10-CM | POA: Diagnosis not present

## 2017-01-23 ENCOUNTER — Telehealth: Payer: Self-pay
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
## 2017-02-15 ENCOUNTER — Telehealth: Payer: Self-pay | Admitting: Family Medicine

## 2017-02-15 NOTE — Telephone Encounter (Signed)
Copied from [place]. Topic: Quick Communication - See Telephone Encounter >> Feb 15, 2017  8:22 AM Aurelio Brash B wrote: CRM for notification. See Telephone encounter for:  Pt wants Dr Danise Mina to know he had nerve conduction test on hands and arms he has carpal tunnel in both  and had injections that didn't help, he is using wrists braces that's are helping a bit,  and wants to know if Dr Danise Mina has any suggestions on medications or exercises that will help him.  02/15/17.

## 2017-02-15 NOTE — Telephone Encounter (Signed)
Pt seeing neurologist Dr Posey Pronto.

## 2017-02-20 NOTE — Telephone Encounter (Signed)
No meds at this time Continue wrist brace. If not improving, next step may be surgery

## 2017-02-20 NOTE — Telephone Encounter (Signed)
Left message on vm per dpr relaying message per Dr. G.  

## 2017-02-26 ENCOUNTER — Encounter: Payer: Self-pay | Admitting: Adult Health

## 2017-02-27 ENCOUNTER — Ambulatory Visit (INDEPENDENT_AMBULATORY_CARE_PROVIDER_SITE_OTHER): Payer: Medicare PPO | Admitting: Adult Health

## 2017-02-27 ENCOUNTER — Encounter: Payer: Self-pay | Admitting: Adult Health

## 2017-02-27 VITALS — BP 145/77 | HR 75 | Ht 67.0 in | Wt 212.2 lb

## 2017-02-27 DIAGNOSIS — G4710 Hypersomnia, unspecified: Secondary | ICD-10-CM

## 2017-02-27 DIAGNOSIS — G4733 Obstructive sleep apnea (adult) (pediatric): Secondary | ICD-10-CM | POA: Diagnosis not present

## 2017-02-27 DIAGNOSIS — Z9989 Dependence on other enabling machines and devices: Secondary | ICD-10-CM | POA: Diagnosis not present

## 2017-02-27 NOTE — Progress Notes (Addendum)
PATIENT: Gabriel [name] DOB: 1945-11-30  REASON FOR VISIT: follow up HISTORY FROM: patient  HISTORY OF PRESENT ILLNESS: Today 02/27/17  Gabriel [name] is a 72 year old male with a history of obstructive sleep apnea on CPAP and hypersomnia.  He returns today for follow-up.  His CPAP download indicates that he use his machine 30 out of 30 days for compliance of 100%.  He uses machine greater than 4 hours every night.  On average he uses his machine 6 hours and 9 minutes.  His residual AHI is 0.8 on 11 cm of water with EPR 2.  He has a leak in the 95th percentile at 41.4 L/min.  He reports that he did recently get a new mask which is the nasal pillows.  He does feel that it leaks but it does not always wake him up.  He reports that he still has significant sleepiness throughout the day.  He does take Provigil and has tried doubling his dose but feels that it offers him a little benefit.  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  He also notes that he will be moving to [place]. He returns today for an evaluation.  HISTORY 08/22/2016 I reviewed his CPAP compliance data from 07/23/2016 through 08/21/2016, which is a total of 30 days, during which time he used his machine every night with percent used days greater than 4 hours at 100%, indicating superb compliance, average usage of 6 hours and 24 minutes, residual AHI 0.6 per hour, leak high with the 95th percentile at 30.8 L/m on a pressure of 11 cm with EPR of 2. He reports traveling pain in different areas. Sadly, his mother passed away in 2016/01/12. There is ongoing stress at home. He has sleep disruption at night, often tending to his wife. Has had sleepiness during the day. He admits that he does not get a good night sleep. He is not sure if the second dose of Provigil has been helpful.  The patient's allergies, current medications, family history, past medical history, past social  history, past surgical history and problem list were reviewed and updated as appropriate.   REVIEW OF SYSTEMS: Out of a complete 14 system review of symptoms, the patient complains only of the following symptoms, and all other reviewed systems are negative.  See HPI  ALLERGIES: Allergies  Allergen Reactions  . Amoxicillin Nausea And Vomiting  . Azithromycin Other (See Comments)    Wide spread body aches  . Doxycycline Other (See Comments)    "don't like how it makes me feel" ?nausea  . Erythromycin Nausea And Vomiting  . Sulfa Drugs Cross Reactors Other (See Comments)    Thrush    HOME MEDICATIONS: Outpatient Medications Prior to Visit  Medication Sig Dispense Refill  . acetaminophen (TYLENOL) 500 MG tablet Take 500 mg by mouth at bedtime.     Marland Kitchen buPROPion (WELLBUTRIN XL) 300 MG 24 hr tablet TAKE 1 TABLET DAILY 90 tablet 0  . HYDROcodone-acetaminophen (NORCO/VICODIN) 5-325 MG tablet Take 1 tablet by mouth as needed for moderate pain.    Marland Kitchen loratadine (CLARITIN) 10 MG tablet Take 10 mg by mouth daily as needed for allergies.    . Melatonin 5 MG CAPS Take 5 mg by mouth at bedtime.     . methocarbamol (ROBAXIN) 750 MG tablet Take 1 tablet (750 mg total) by mouth at bedtime.    . modafinil (PROVIGIL) 100 MG  tablet Take 1 tablet (100 mg total) by mouth 2 (two) times daily. 180 tablet 3  . omeprazole (PRILOSEC) 20 MG capsule TAKE 1 CAPSULE DAILY 90 capsule 3  . Psyllium (METAMUCIL) 30.9 % POWD Take 5 mLs by mouth daily as needed.    . tamsulosin (FLOMAX) 0.4 MG CAPS capsule Take 0.4 mg by mouth at bedtime.    . vitamin B-12 (CYANOCOBALAMIN) 1000 MCG tablet Take 1,000 mcg by mouth daily.    Marland Kitchen zolpidem (AMBIEN) 5 MG tablet Take 1 tablet (5 mg total) by mouth at bedtime as needed for sleep. 90 tablet 1   No facility-administered medications prior to visit.     PAST MEDICAL HISTORY: Past Medical History:  Diagnosis Date  . BPH (benign prostatic hypertrophy)    h/o prostate nodule,  followed by Dr. Risa Grill yearly  . Cervical neck pain with evidence of disc disease    h/o bulging discs  . Chronic lower back pain    DDD, prior on lyrica, gabapentin, lumbar stenosis with persistent numbness R ant thigh, ESI didn't help, now seeing [facility]  . Depression with anxiety    h/o remote hospitalization for depression  . GERD (gastroesophageal reflux disease)   . Hematuria - cause not known    s/p urology workup 2010, nl CT, cystoscopy (doesn't want rpt), cytology  . History of chicken pox   . History of colon polyps 2005   tubular adenoma, on rpt no more (2010)  . Insomnia    [place]  . OSA on CPAP    cpap machine at 10cm, ambien, daytime drowsiness attributed to OSA, last changed dextroamphetamine to nuvigil, previously tried provigil  . Prostate nodule    followed by Risa Grill, reassuring exam and PSA  . Seronegative arthritis    RA, previously on MTX, RF <7, ANA nl (rheum Dr. Donnamarie Poag)  . Torn rotator cuff    bilateral, s/p L repair and now recurrent, chronic massive R RTC tear (supraspinatus)    PAST SURGICAL HISTORY: Past Surgical History:  Procedure Laterality Date  . ACHILLES TENDON REPAIR  2002  . CATARACT EXTRACTION  2007   left  . COLONOSCOPY  11/2008   diverticulosis, rpt 5 yrs  . COLONOSCOPY  07/2014   2 TA, mild diverticulosis, rpt 5 yrs [name])  . DOBUTAMINE STRESS ECHO  08/2006   negative for ischemia  . LUMBAR DISC SURGERY  07/2010   L3/4 diskectomy (Dr. Harl Bowie at [facility])  . MRI thoracic  09/2010   thoracic DDD/kyphosis, minimal anterolisthesis T2/3 with B foraminal narrowing  . ROTATOR CUFF REPAIR  2008   Left (Dr. Gaylyn Rong in [place], now [place])  . UPPER GASTROINTESTINAL ENDOSCOPY  02/2005   minimal GERD, small gastric polyp, tiny HH    FAMILY HISTORY: Family History  Problem Relation Age of Onset  . Arthritis Mother   . COPD Father        smoker  . Cancer Neg Hx   . Stroke Neg Hx   . Coronary artery disease Neg Hx   . Diabetes Neg Hx      SOCIAL HISTORY: Social History   Socioeconomic History  . Marital status: Married    Spouse name: [name]  . Number of children: 2  . Years of education: Masters +  . Highest education level: Not on file  Social Needs  . Financial resource strain: Not on file  . Food insecurity - worry: Not on file  . Food insecurity - inability: Not on file  . Transportation  needs - medical: Not on file  . Transportation needs - non-medical: Not on file  Occupational History  . Occupation: Engineer, civil (consulting): OTHER    Comment: [employer]  Tobacco Use  . Smoking status: Never Smoker  . Smokeless tobacco: Never Used  Substance and Sexual Activity  . Alcohol use: No    Alcohol/week: 0.0 oz  . Drug use: No  . Sexual activity: Yes  Other Topics Concern  . Not on file  Social History Narrative   Caffeine: avoids   Patient is married [name]) Lives with wife - who is in a power chair.   Mother passed away 01-24-2016   Occupation: Scientist, product/process development, Oceanographer   Edu: masters +   Likes to travel   Activity: no regular activity, to join [employer]   Diet: some fruits/vegetables   Patient has two children.   Patient is left-handed.      PHYSICAL EXAM  Vitals:   02/27/17 0924  BP: (!) 145/77  Pulse: 75  Weight: 212 lb 3.2 oz (96.3 kg)  Height: 5\' 7"  (1.702 m)   Body mass index is 33.24 kg/m.  Generalized: Well developed, in no acute distress   Neurological examination  Mentation: Alert oriented to time, place, history taking. Follows all commands speech and language fluent Cranial nerve II-XII: Pupils were equal round reactive to light. Extraocular movements were full, visual field were full on confrontational test. Facial sensation and strength were normal. Uvula tongue midline. Head turning and shoulder shrug  were normal and symmetric. Motor: The motor testing reveals 5 over 5 strength of all 4 extremities. Good symmetric motor tone is noted throughout.  Sensory: Sensory  testing is intact to soft touch on all 4 extremities. No evidence of extinction is noted.  Coordination: Cerebellar testing reveals good finger-nose-finger and heel-to-shin bilaterally.  Gait and station: Gait is normal.  Reflexes: Deep tendon reflexes are symmetric and normal bilaterally.   DIAGNOSTIC DATA (LABS, IMAGING, TESTING) - I reviewed patient records, labs, notes, testing and imaging myself where available.  Lab Results  Component Value Date   WBC 6.7 05/13/2015   HGB 14.4 05/13/2015   HCT 42.8 05/13/2015   MCV 90.9 05/13/2015   PLT 183.0 05/13/2015      Component Value Date/Time   NA 139 05/30/2016 1224   NA 140 11/25/2009   K 4.2 05/30/2016 1224   K 4.1 11/25/2009   CL 104 05/30/2016 1224   CO2 28 05/30/2016 1224   GLUCOSE 89 05/30/2016 1224   BUN 18 05/30/2016 1224   BUN 16 11/25/2009   CREATININE 1.15 05/30/2016 1224   CREATININE 1.01 04/12/2013 1551   CALCIUM 9.2 05/30/2016 1224   CALCIUM 8.9 11/25/2009   PROT 6.8 05/13/2015 0926   ALBUMIN 3.9 05/13/2015 0926   ALBUMIN 3.9 11/25/2009   AST 19 05/13/2015 0926   AST 26 11/25/2009   ALT 15 05/13/2015 0926   ALKPHOS 93 05/13/2015 0926   ALKPHOS 108 11/25/2009   BILITOT 0.5 05/13/2015 0926   BILITOT 0.9 11/25/2009   Lab Results  Component Value Date   CHOL 130 12/01/2015   HDL 47.30 12/01/2015   LDLCALC 71 12/01/2015   LDLDIRECT 84 02/22/2008   TRIG 58.0 12/01/2015   CHOLHDL 3 12/01/2015   No results found for: HGBA1C Lab Results  Component Value Date   VITAMINB12 740 05/30/2016   Lab Results  Component Value Date   TSH 0.57 05/13/2015      ASSESSMENT AND PLAN 71  y.o. year old male  has a past medical history of BPH (benign prostatic hypertrophy), Cervical neck pain with evidence of disc disease, Chronic lower back pain, Depression with anxiety, GERD (gastroesophageal reflux disease), Hematuria - cause not known, History of chicken pox, History of colon polyps (2005), Insomnia, OSA on CPAP,  Prostate nodule, Seronegative arthritis, and Torn rotator cuff. here with   1.  Obstructive sleep apnea on CPAP  2. Hypersomnia  The download shows good compliance and treatment of his apnea. He still has a significant leak. He does not want to go to his DME for mask refitting prefers to meet with our sleep manager. He will come tomorrow 1/22 at 10:00 for mask refitting. He will continue on provigil for now. He will follow-up in 6 months.   The patient does note that since he is moving he may find a sleep clinic closer to him to manage his CPAP.    Ward Givens, MSN, NP-C 02/27/2017, 9:26 AM [facility] [address] [place], [place] [address] [phone]  I reviewed the above note and documentation by the Nurse Practitioner and agree with the history, physical exam, assessment and plan as outlined above. I was immediately available for face-to-face consultation. Star Age, MD, PhD [facility])

## 2017-02-27 NOTE — Patient Instructions (Signed)
Your Plan:  Continue using CPAP nightly  Will set up for mask refitting If your symptoms worsen or you develop new symptoms please let us know.   Thank you for coming to see [place] at [facility]. I hope we have been able to provide you high quality care today.  You may receive a patient satisfaction survey over the next few weeks. We would appreciate your feedback and comments so that we may continue to improve ourselves and the health of our patients.

## 2017-03-08 ENCOUNTER — Other Ambulatory Visit: Payer: Self-pay

## 2017-03-08 ENCOUNTER — Other Ambulatory Visit: Payer: Self-pay | Admitting: Family Medicine

## 2017-03-08 DIAGNOSIS — G4733 Obstructive sleep apnea (adult) (pediatric): Secondary | ICD-10-CM

## 2017-03-08 DIAGNOSIS — G4719 Other hypersomnia: Secondary | ICD-10-CM

## 2017-03-08 DIAGNOSIS — Z9989 Dependence on other enabling machines and devices: Secondary | ICD-10-CM

## 2017-03-08 MED ORDER — MODAFINIL 100 MG PO TABS: 100.0000 mg | ORAL_TABLET | Freq: Two times a day (BID) | ORAL | 1 refills | Status: AC

## 2017-03-08 NOTE — Telephone Encounter (Signed)
Pt is up to date on his appts and is due for a refill on his modafinil. RX printed, will give to Dr. Rexene Alberts for review and signature.

## 2017-03-08 NOTE — Telephone Encounter (Signed)
RX for modafinil faxed to [facility]. Received a receipt of confirmation.

## 2017-03-08 NOTE — Telephone Encounter (Signed)
Last filled:  07/15/16, #270 Last OV:  12/06/16 Next OV:  none

## 2017-03-10 ENCOUNTER — Encounter: Payer: Self-pay | Admitting: Family Medicine

## 2017-03-10 ENCOUNTER — Ambulatory Visit (INDEPENDENT_AMBULATORY_CARE_PROVIDER_SITE_OTHER): Payer: Medicare PPO | Admitting: Family Medicine

## 2017-03-10 VITALS — BP 126/80 | HR 66 | Temp 98.3°F | Wt 208.8 lb

## 2017-03-10 DIAGNOSIS — G5603 Carpal tunnel syndrome, bilateral upper limbs: Secondary | ICD-10-CM

## 2017-03-10 DIAGNOSIS — M5412 Radiculopathy, cervical region: Secondary | ICD-10-CM | POA: Diagnosis not present

## 2017-03-10 DIAGNOSIS — J069 Acute upper respiratory infection, unspecified: Secondary | ICD-10-CM | POA: Diagnosis not present

## 2017-03-10 MED ORDER — BUPROPION HCL ER (XL) 300 MG PO TB24: 300.0000 mg | ORAL_TABLET | Freq: Every day | ORAL | 1 refills | Status: AC

## 2017-03-10 MED ORDER — GLUCOSAMINE HCL 1500 MG PO TABS: 1.0000 | ORAL_TABLET | Freq: Every day | ORAL | Status: AC

## 2017-03-10 MED ORDER — HYDROCODONE-HOMATROPINE 5-1.5 MG/5ML PO SYRP
5.0000 mL | ORAL_SOLUTION | Freq: Every evening | ORAL | 0 refills | Status: AC | PRN
Start: 2017-03-10 — End: ?

## 2017-03-10 NOTE — Patient Instructions (Signed)
You have a viral upper respiratory infection. Antibiotics are not needed for this. Viral infections usually take 7-10 days to resolve. The cough can last a few weeks to go away. Use medication as prescribed: hycodan cough syrup for night Push fluids and plenty of rest. Please return if you are not improving as expected, or if you have high fevers (>101.5) or difficulty swallowing or worsening productive cough. Call clinic with questions.  Good to see you today. I hope you start feeling better soon.

## 2017-03-10 NOTE — Assessment & Plan Note (Signed)
Anticipate viral with laryngitis given short duration and benign exam. Supportive care reviewed. Rx hycodan cough syrup for night time. Update if not improving with treatment.

## 2017-03-10 NOTE — Progress Notes (Signed)
BP 126/80 (BP Location: Left Arm, Patient Position: Sitting, Cuff Size: Normal)   Pulse 66   Temp 98.3 F (36.8 C) (Oral)   Wt 208 lb 12 oz (94.7 kg)   SpO2 96%   BMI 32.69 kg/m    CC: URI Subjective:    Patient ID: Gabriel [name], male    DOB: [birth year], 72 y.o.   MRN: [id number]  HPI: Gabriel [name] is a 72 y.o. male presenting on 03/10/2017 for URI (Hoarsness, runny nose, nasal congestion and scratchy throat. Started about 2 days ago. Tried OTC cough syrup and cough drops.)   3d h/o dry sore throat, nasal congestion, rhinorrhea, dry cough, PNdrainage, hoarseness. Fatigue. Some trouble sleeping at night due to cough.   [place] cough drops. Has tried hydrocodone cough syrup.  No fevers/chills, ear or tooth pain, headaches, dyspnea or wheezing.  No sick contacts at home.  Non smoker.   Planning on retiring at end of February - planning on move to [place] area to retirement home. Asks about local physicians there.   Relevant past medical, surgical, family and social history reviewed and updated as indicated. Interim medical history since our last visit reviewed. Allergies and medications reviewed and updated. Outpatient Medications Prior to Visit  Medication Sig Dispense Refill  . HYDROcodone-acetaminophen (NORCO/VICODIN) 5-325 MG tablet Take 1 tablet by mouth as needed for moderate pain.    Marland Kitchen loratadine (CLARITIN) 10 MG tablet Take 10 mg by mouth daily as needed for allergies.    . Melatonin 5 MG CAPS Take 5 mg by mouth at bedtime.     . meloxicam (MOBIC) 15 MG tablet Take 7.5-15 mg by mouth daily. Takes daily as needed    . methocarbamol (ROBAXIN) 750 MG tablet Take 1 tablet (750 mg total) by mouth at bedtime.    . modafinil (PROVIGIL) 100 MG tablet Take 1 tablet (100 mg total) by mouth 2 (two) times daily. (Patient taking differently: Take 100 mg by mouth 2 (two) times daily. Takes once daily) 180 tablet 1  . omeprazole (PRILOSEC) 20 MG capsule TAKE 1 CAPSULE DAILY 90  capsule 3  . Psyllium (METAMUCIL) 30.9 % POWD Take 5 mLs by mouth daily as needed.    . tamsulosin (FLOMAX) 0.4 MG CAPS capsule Take 0.4 mg by mouth at bedtime.    . vitamin B-12 (CYANOCOBALAMIN) 1000 MCG tablet Take 1,000 mcg by mouth daily.    Marland Kitchen zolpidem (AMBIEN) 5 MG tablet Take 1 tablet (5 mg total) by mouth at bedtime as needed for sleep. 90 tablet 1  . buPROPion (WELLBUTRIN XL) 300 MG 24 hr tablet TAKE 1 TABLET DAILY (NEED APPOINTMENT FOR FURTHER REFILLS) 90 tablet 0  . Glucosamine 500 MG CAPS Take by mouth.    . Glucosamine-Chondroit-Vit C-Mn (GLUCOSAMINE 1500 COMPLEX PO) Take 1 tablet by mouth daily.     No facility-administered medications prior to visit.      Per HPI unless specifically indicated in ROS section below Review of Systems     Objective:    BP 126/80 (BP Location: Left Arm, Patient Position: Sitting, Cuff Size: Normal)   Pulse 66   Temp 98.3 F (36.8 C) (Oral)   Wt 208 lb 12 oz (94.7 kg)   SpO2 96%   BMI 32.69 kg/m   Wt Readings from Last 3 Encounters:  03/10/17 208 lb 12 oz (94.7 kg)  02/27/17 212 lb 3.2 oz (96.3 kg)  12/06/16 213 lb (96.6 kg)    Physical Exam  Constitutional: He  appears well-developed and well-nourished. No distress.  HENT:  Head: Normocephalic and atraumatic.  Right Ear: Hearing, tympanic membrane, external ear and ear canal normal.  Left Ear: Hearing, tympanic membrane, external ear and ear canal normal.  Nose: Mucosal edema and rhinorrhea present. Right sinus exhibits no maxillary sinus tenderness and no frontal sinus tenderness. Left sinus exhibits no maxillary sinus tenderness and no frontal sinus tenderness.  Mouth/Throat: Uvula is midline and mucous membranes are normal. Posterior oropharyngeal erythema present. No oropharyngeal exudate, posterior oropharyngeal edema or tonsillar abscesses.  Eyes: Conjunctivae and EOM are normal. Pupils are equal, round, and reactive to light. No scleral icterus.  Neck: Normal range of motion.  Neck supple.  Cardiovascular: Normal rate, regular rhythm, normal heart sounds and intact distal pulses.  No murmur heard. Pulmonary/Chest: Effort normal and breath sounds normal. No respiratory distress. He has no wheezes. He has no rales.  Lungs clear  Lymphadenopathy:    He has no cervical adenopathy.  Skin: Skin is warm and dry. No rash noted.  Nursing note and vitals reviewed.     Assessment & Plan:  Suggested Hosp Dr[facility] - he will look into this.  Problem List Items Addressed This Visit    Acute upper respiratory infection - Primary    Anticipate viral with laryngitis given short duration and benign exam. Supportive care reviewed. Rx hycodan cough syrup for night time. Update if not improving with treatment.       Bilateral carpal tunnel syndrome    Reviewed recent NCS 12/2016. He is already wearing wrist braces regularly and on meloxicam PRN. Declines hand surgery eval at this time.       Relevant Medications   buPROPion (WELLBUTRIN XL) 300 MG 24 hr tablet   Left cervical radiculopathy    Confirmed by NCS 12/2016. Known disc disease.       Relevant Medications   buPROPion (WELLBUTRIN XL) 300 MG 24 hr tablet       Follow up plan: Return if symptoms worsen or fail to improve.  Ria Bush, MD

## 2017-03-10 NOTE — Assessment & Plan Note (Signed)
Confirmed by NCS 12/2016. Known disc disease.

## 2017-03-10 NOTE — Assessment & Plan Note (Signed)
Reviewed recent NCS 12/2016. He is already wearing wrist braces regularly and on meloxicam PRN. Declines hand surgery eval at this time.

## 2017-03-20 ENCOUNTER — Other Ambulatory Visit: Payer: Self-pay

## 2017-03-20 MED ORDER — MELOXICAM 15 MG PO TABS: 7.5000 mg | ORAL_TABLET | Freq: Every day | ORAL | 0 refills | Status: AC

## 2017-03-20 NOTE — Telephone Encounter (Addendum)
Last OV:  03/10/17 Next OV:  None  Requesting 90 day rx

## 2017-03-22 ENCOUNTER — Telehealth: Payer: Self-pay

## 2017-03-22 MED ORDER — CEFPODOXIME PROXETIL 200 MG PO TABS: 200.0000 mg | ORAL_TABLET | Freq: Two times a day (BID) | ORAL | 0 refills | Status: AC

## 2017-03-22 NOTE — Telephone Encounter (Signed)
Copied this note from [place] from Claudia Desanctis. Relation to pt: self Call back number: [phone]    Reason for call:  Patient last seen by Dr. Danise Mina symptoms have not improved experiencing stuffy nose coughing up green/yellow phlegm, please advise

## 2017-03-22 NOTE — Telephone Encounter (Signed)
Pt saw Dr Darnell Level on 03/10/17.

## 2017-03-22 NOTE — Telephone Encounter (Signed)
Ongoing symptoms for >2 wks  Will Rx as bacterial sinusitis - with cephalosporin course.  Mult abx intolerances.

## 2017-03-22 NOTE — Telephone Encounter (Signed)
Spoke with pt notifying him rx was sent to [facility]. Pt says ok and expresses his thanks.

## 2017-03-24 ENCOUNTER — Ambulatory Visit (INDEPENDENT_AMBULATORY_CARE_PROVIDER_SITE_OTHER): Payer: Medicare PPO | Admitting: Podiatry

## 2017-03-24 ENCOUNTER — Encounter: Payer: Self-pay | Admitting: Podiatry

## 2017-03-24 DIAGNOSIS — L923 Foreign body granuloma of the skin and subcutaneous tissue: Secondary | ICD-10-CM | POA: Diagnosis not present

## 2017-03-25 NOTE — Progress Notes (Signed)
Subjective:   Patient ID: Gabriel [name], male   DOB: 72 y.o.   MRN: [id number]   HPI Patient presents stating he is having a lot of pain in the plantar aspect of his left foot and he is not sure if he stepped on something or there may be any other kind of pathology going on   ROS      Objective:  Physical Exam  Neurovascular status is intact with patient noted to have on the lateral side of the left foot 2 areas of irritation that is localized with no proximal edema erythema or drainage noted.  They are very tender when palpated     Assessment:  Probability for foreign body of the left foot that may have occurred with patient unaware if this     Plan:  Sterile prep applied to the left foot using sterile sharp instrumentation careful debridement accomplished and I was able to extract 2 foreign bodies from the left foot lateral side that are local with no drainage noted.  This gave him instant relief I flushed the area applied sterile dressing and instructed on home soaks and if any redness or swelling should occur he is to reappoint

## 2017-05-24 ENCOUNTER — Ambulatory Visit: Payer: Medicare Other | Admitting: Family Medicine

## 2017-05-30 ENCOUNTER — Ambulatory Visit: Payer: Medicare Other

## 2017-06-05 ENCOUNTER — Other Ambulatory Visit: Payer: Self-pay | Admitting: Family Medicine

## 2017-07-03 ENCOUNTER — Encounter: Payer: Self-pay | Admitting: Family Medicine

## 2017-07-19 ENCOUNTER — Encounter

## 2017-07-19 ENCOUNTER — Ambulatory Visit: Payer: PRIVATE HEALTH INSURANCE

## 2017-09-03 ENCOUNTER — Other Ambulatory Visit: Payer: Self-pay | Admitting: Family Medicine

## 2017-09-04 ENCOUNTER — Telehealth: Payer: Self-pay | Admitting: Family Medicine

## 2017-09-04 NOTE — Telephone Encounter (Signed)
Copied from [place] [phone]. Topic: Inquiry >> Sep 04, 2017  4:39 PM Gabriel [name], Marland Kitchen wrote: Reason for CRM: pt called  - he is needing a letter stating that he is up to date on immunizations and free of communicable diseases for future employment. He would like this letter faxed to [phone] And he would also like to have a call at [phone]  when this is being faxed.  Pt does have a new primary care in [place], however, that provider told him there was nothing in his chart about immunizations.

## 2017-09-05 NOTE — Telephone Encounter (Signed)
Letter written and immunization record printed and in Gabriel [name]'s box.

## 2017-09-05 NOTE — Telephone Encounter (Signed)
Faxed letter and immunization record.  Spoke with pt informing him info was faxed.  Pt is requesting info be mailed to his address in chart.  Mailed letter and immunization record to pt.

## 2017-12-05 ENCOUNTER — Other Ambulatory Visit: Payer: Self-pay | Admitting: Family Medicine

## 2017-12-05 NOTE — Telephone Encounter (Signed)
Pt moved to [place]  Has a new pcp.  I took dr g off as his pcp

## 2017-12-05 NOTE — Telephone Encounter (Signed)
Please call patient and schedule annual physical.  Send back for refill after appointment is scheduled.
